# Patient Record
Sex: Female | Born: 1935 | Race: White | Hispanic: No | Marital: Single | Smoking: Former smoker
Health system: Southern US, Community
[De-identification: ages and names within clinical notes are randomized; demographics above are authoritative.]

## PROBLEM LIST (undated history)

## (undated) DIAGNOSIS — M545 Low back pain, unspecified: Secondary | ICD-10-CM

## (undated) DIAGNOSIS — E785 Hyperlipidemia, unspecified: Secondary | ICD-10-CM

## (undated) DIAGNOSIS — E559 Vitamin D deficiency, unspecified: Secondary | ICD-10-CM

## (undated) DIAGNOSIS — M1990 Unspecified osteoarthritis, unspecified site: Secondary | ICD-10-CM

## (undated) DIAGNOSIS — R7303 Prediabetes: Secondary | ICD-10-CM

## (undated) DIAGNOSIS — I10 Essential (primary) hypertension: Secondary | ICD-10-CM

## (undated) HISTORY — PX: EYE SURGERY: SHX253

## (undated) HISTORY — DX: Vitamin D deficiency, unspecified: E55.9

## (undated) HISTORY — PX: JOINT REPLACEMENT: SHX530

## (undated) HISTORY — PX: ABDOMINAL HYSTERECTOMY: SHX81

## (undated) HISTORY — DX: Low back pain: M54.5

## (undated) HISTORY — DX: Low back pain, unspecified: M54.50

## (undated) HISTORY — DX: Hyperlipidemia, unspecified: E78.5

## (undated) HISTORY — PX: BACK SURGERY: SHX140

## (undated) HISTORY — DX: Prediabetes: R73.03

## (undated) HISTORY — PX: SPINE SURGERY: SHX786

## (undated) HISTORY — PX: OTHER SURGICAL HISTORY: SHX169

## (undated) HISTORY — DX: Essential (primary) hypertension: I10

---

## 2006-11-09 HISTORY — PX: OTHER SURGICAL HISTORY: SHX169

## 2006-11-09 LAB — HM COLONOSCOPY

## 2007-07-04 ENCOUNTER — Emergency Department (HOSPITAL_COMMUNITY): Admission: EM | Admit: 2007-07-04 | Discharge: 2007-07-05 | Payer: Self-pay | Admitting: Emergency Medicine

## 2007-10-03 ENCOUNTER — Encounter: Admission: RE | Admit: 2007-10-03 | Discharge: 2007-10-03 | Payer: Self-pay | Admitting: Specialist

## 2009-07-25 ENCOUNTER — Encounter: Admission: RE | Admit: 2009-07-25 | Discharge: 2009-07-25 | Payer: Self-pay | Admitting: Internal Medicine

## 2009-08-01 ENCOUNTER — Encounter: Payer: Self-pay | Admitting: Cardiology

## 2009-11-05 ENCOUNTER — Encounter: Payer: Self-pay | Admitting: Cardiology

## 2010-08-05 ENCOUNTER — Encounter: Payer: Self-pay | Admitting: Cardiology

## 2010-08-08 ENCOUNTER — Encounter: Payer: Self-pay | Admitting: Cardiology

## 2010-08-27 ENCOUNTER — Ambulatory Visit: Payer: Self-pay | Admitting: Cardiology

## 2010-08-27 ENCOUNTER — Encounter: Payer: Self-pay | Admitting: Cardiology

## 2010-09-08 ENCOUNTER — Encounter: Payer: Self-pay | Admitting: Cardiovascular Disease

## 2010-09-08 ENCOUNTER — Telehealth (INDEPENDENT_AMBULATORY_CARE_PROVIDER_SITE_OTHER): Payer: Self-pay | Admitting: Radiology

## 2010-09-09 ENCOUNTER — Encounter: Payer: Self-pay | Admitting: Cardiology

## 2010-09-09 ENCOUNTER — Encounter: Payer: Self-pay | Admitting: Cardiovascular Disease

## 2010-09-09 ENCOUNTER — Ambulatory Visit: Payer: Self-pay | Admitting: Cardiology

## 2010-09-09 ENCOUNTER — Encounter (HOSPITAL_COMMUNITY)
Admission: RE | Admit: 2010-09-09 | Discharge: 2010-11-08 | Payer: Self-pay | Source: Home / Self Care | Attending: Cardiology | Admitting: Cardiology

## 2010-09-09 ENCOUNTER — Ambulatory Visit: Payer: Self-pay | Admitting: Cardiovascular Disease

## 2010-09-09 ENCOUNTER — Ambulatory Visit: Payer: Self-pay

## 2010-09-09 ENCOUNTER — Ambulatory Visit (HOSPITAL_COMMUNITY): Admission: RE | Admit: 2010-09-09 | Discharge: 2010-09-09 | Payer: Self-pay | Admitting: Cardiology

## 2010-09-09 HISTORY — PX: OTHER SURGICAL HISTORY: SHX169

## 2010-09-11 ENCOUNTER — Ambulatory Visit: Payer: Self-pay | Admitting: Cardiology

## 2010-09-24 ENCOUNTER — Encounter: Payer: Self-pay | Admitting: Cardiology

## 2010-09-30 LAB — CONVERTED CEMR LAB
BUN: 31 mg/dL — ABNORMAL HIGH (ref 6–23)
GFR calc non Af Amer: 39.11 mL/min (ref >60)
Glucose, Bld: 115 mg/dL — ABNORMAL HIGH (ref 70–99)
Potassium: 4.7 meq/L (ref 3.5–5.1)

## 2010-10-30 ENCOUNTER — Encounter: Payer: Self-pay | Admitting: Cardiology

## 2010-12-09 NOTE — Assessment & Plan Note (Signed)
Summary: Cardiology Nuclear Testing  Nuclear Med Background Indications for Stress Test: Evaluation for Ischemia, Abnormal EKG    History Comments: No documented CAD  Symptoms: DOE, Fatigue    Nuclear Pre-Procedure Cardiac Risk Factors: History of Smoking, Hypertension, Lipids, NIDDM Caffeine/Decaff Intake: none NPO After: 7:00 PM Lungs: Clear IV 0.9% NS with Angio Cath: 22g     IV Site: R Antecubital IV Started by: Matilde Haymaker, RN Chest Size (in) 40     Cup Size B     Height (in): 59 Weight (lb): 127 BMI: 25.74  Nuclear Med Study 1 or 2 day study:  1 day     Stress Test Type:  Carlton Adam Reading MD:  Mertie Moores, MD     Referring MD:  Loralie Champagne, MD Resting Radionuclide:  Technetium 65m Tetrofosmin     Resting Radionuclide Dose:  10.3 mCi  Stress Radionuclide:  Technetium 59m Tetrofosmin     Stress Radionuclide Dose:  33 mCi   Stress Protocol   Lexiscan: 0.4 mg   Stress Test Technologist:  Valetta Fuller, CMA-N     Nuclear Technologist:  Annye Rusk, CNMT  Rest Procedure  Myocardial perfusion imaging was performed at rest 45 minutes following the intravenous administration of Technetium 104m Tetrofosmin.  Stress Procedure  The patient received IV Lexiscan 0.4 mg over 15-seconds.  Technetium 31m Tetrofosmin injected at 30-seconds.  There were very minor nonspecific ST changes with infusion.  Quantitative spect images were obtained after a 45 minute delay.  QPS Raw Data Images:  Normal; no motion artifact; normal heart/lung ratio. Stress Images:  Normal homogeneous uptake in all areas of the myocardium. Rest Images:  Normal homogeneous uptake in all areas of the myocardium. Subtraction (SDS):  No evidence of ischemia. Transient Ischemic Dilatation:  0.93  (Normal <1.22)  Lung/Heart Ratio:  0.34  (Normal <0.45)  Quantitative Gated Spect Images QGS EDV:  37 ml QGS ESV:  7 ml QGS EF:  81 % QGS cine images:  Normal LV function.  Findings Normal nuclear  study      Overall Impression  BP Response: Normal blood pressure response. Clinical Symptoms: No chest pain ECG Impression: No significant ST segment change suggestive of ischemia. Overall Impression: Normal stress nuclear study. Overall Impression Comments: No evidence of ischemia.  Normal LV function.  Appended Document: Cardiology Nuclear Testing appt 09/11/10 with Dr Aundra Dubin

## 2010-12-09 NOTE — Progress Notes (Signed)
Summary: nuc pre-proedure  Phone Note Outgoing Call   Call placed by: Charlton Amor, CNMT,  September 08, 2010 1:02 PM Call placed to: Patient Reason for Call: Confirm/change Appt Summary of Call: Reviewed information on Myoview Information Sheet (see scanned document for further details).  Spoke with patient.  Initial call taken by: Charlton Amor, CNMT,  September 08, 2010 1:02 PM     Nuclear Med Background Indications for Stress Test: Evaluation for Ischemia, Abnormal EKG     Symptoms: DOE    Nuclear Pre-Procedure Cardiac Risk Factors: History of Smoking, Hypertension, Lipids, NIDDM Height (in): 68

## 2010-12-09 NOTE — Assessment & Plan Note (Signed)
Summary: f/u echo/myoview done 09-09-10/sl   Primary Provider:  Dr Rachel Moulds   History of Present Illness: 77 yo with history of HTN, hyperlipidemia, and diet-controlled DM presented recently for cardiology evaluation.  Patient was seen at Dr. Franklyn Lor office and noted to have an abnormal ECG so was referred for cardiology evaluation.  Patient has not history of heart problems.  In the past, she has had some heartburn-type chest pain after meals that resolves with Tums.  This has not occurred recently.  She has been short of breath when climbing a flight of steps for about a year.  She is short of breath if she walks too fast.  No orthopnea or PND.  Lexiscan myoview showed EF 81%, normal perfusion images.  Echo showed normal LV systolic function with mild LV hypertrophy.  Labs (9/11): K 5.1, creatinine 1.22, LDL 61, HDL 72, TSH normal Labs (10/11): K 4.7, creatinine 1.4  Current Medications (verified): 1)  Enalapril Maleate 10 Mg Tabs (Enalapril Maleate) .... Take 1 Tablet Twice Daily 2)  Lipitor 40 Mg Tabs (Atorvastatin Calcium) .... Take One Tablet By Mouth Daily. 3)  Niaspan 500 Mg Cr-Tabs (Niacin (Antihyperlipidemic)) .... At Bedtime 4)  Aspirin 81 Mg Tbec (Aspirin) .... Take One Tablet By Mouth Daily 5)  Calcium Carbonate-Vitamin D 600-400 Mg-Unit  Tabs (Calcium Carbonate-Vitamin D) .... Two Times A Day 6)  Fish Oil   Oil (Fish Oil) .... Two Times A Day 7)  Citrucel 500 Mg Tabs (Methylcellulose (Laxative)) .... Two Times A Day 8)  Magnesium 250 Mg Tabs (Magnesium) .... Once Daily 9)  Aleve 220 Mg Tabs (Naproxen Sodium) .... As Needed  Allergies: No Known Drug Allergies  Past History:  Past Medical History: 1. HTN: Echo (11/11) with EF 55-60%, mild LV hypertrophy, PA systolic pressure 34 mmHg.  2. DM II: diet-controlled 3. Hyperlipidemia 4. Peripheral Neuropathy 5. Low back pain 6. Lexiscan myoview (11/11): EF 81%, normal perfusion images suggesting no ischemia or  infarction.   Family History: Reviewed history from 08/27/2010 and no changes required. Mother-CHF- deceased age 6 Sibling-DM No premature CAD  Social History: Reviewed history from 08/27/2010 and no changes required. 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  Alcohol Use - yes -- occasional Regular Exercise - no Drug Use - no  Vital Signs:  Patient profile:   77 year old female Height:      59 inches Weight:      130.25 pounds Pulse rate:   70 / minute Resp:     14 per minute BP sitting:   138 / 62  (right arm)  Vitals Entered By: Mikle Bosworth RN (September 11, 2010 12:24 PM) Is Patient Diabetic? Yes Comments no complaints.   Physical Exam  General:  Elderly woman in no distress Neck:  Neck supple, no JVD. No masses, thyromegaly or abnormal cervical nodes. Lungs:  Clear bilaterally to auscultation and percussion. Heart:  Non-displaced PMI, chest non-tender; regular rate and rhythm, S1, S2 without murmurs, rubs or gallops. Carotid upstroke normal, no bruit.  Pedals normal pulses. No edema, no varicosities. Abdomen:  Bowel sounds positive; abdomen soft and non-tender without masses, organomegaly, or hernias noted. No hepatosplenomegaly. Extremities:  No clubbing or cyanosis. Neurologic:  Alert and oriented x 3. Psych:  Normal affect.   Impression & Recommendations:  Problem # 1:  SHORTNESS OF BREATH (ICD-786.05) I suspect that this may be due to a degree of deconditioning.  Echo and  myoview were unremarkable.   Problem # 2:  HYPERTENSION, UNSPECIFIED (ICD-401.9) BP looks good after increasing enalapril.  Check BMET.   Patient Instructions: 1)  Your physician recommends that you schedule a follow-up appointment as needed 2)  Your physician recommends that you  lab work at your primary care MD office 3)  Your physician recommends that you continue on your current medications as directed. Please refer to the Current  Medication list given to you today.

## 2010-12-09 NOTE — Assessment & Plan Note (Signed)
Summary: np6/abn ekg/family hx of chf   Visit Type:  Initial Consult Primary Provider:  Dr Rachel Moulds  CC:  no complaints.  History of Present Illness: 77 yo with history of HTN, hyperlipidemia, and diet-controlled DM presents for cardiology evaluation.  Patient was recently seen at Dr. Franklyn Lor office and noted to have an abnormal ECG so was referred for cardiology evaluation.  Patient has not history of heart problems.  In the past, she has had some heartburn-type chest pain after meals that resolves with Tums.  This has not occurred recently.  She has been short of breath when climbing a flight of steps for about a year.  She is short of breath if she walks too fast.  No orthopnea or PND.    ECG: NSR, possible old anteroseptal MI  Labs (9/11): K 5.1, creatinine 1.22, LDL 61, HDL 72, TSH normal  Preventive Screening-Counseling & Management  Alcohol-Tobacco     Smoking Status: quit  Caffeine-Diet-Exercise     Does Patient Exercise: no      Drug Use:  no.    Current Medications (verified): 1)  Enalapril Maleate 5 Mg Tabs (Enalapril Maleate) .... Take One Tablet By Mouth Three Times A Day 2)  Lipitor 40 Mg Tabs (Atorvastatin Calcium) .... Take One Tablet By Mouth Daily. 3)  Niaspan 500 Mg Cr-Tabs (Niacin (Antihyperlipidemic)) .... At Bedtime 4)  Aspirin 81 Mg Tbec (Aspirin) .... Take One Tablet By Mouth Daily 5)  Calcium Carbonate-Vitamin D 600-400 Mg-Unit  Tabs (Calcium Carbonate-Vitamin D) .... Two Times A Day 6)  Fish Oil   Oil (Fish Oil) .... Two Times A Day 7)  Citrucel 500 Mg Tabs (Methylcellulose (Laxative)) .... Two Times A Day 8)  Magnesium 250 Mg Tabs (Magnesium) .... Once Daily 9)  Aleve 220 Mg Tabs (Naproxen Sodium) .... As Needed  Allergies (verified): No Known Drug Allergies  Past History:  Past Medical History: 1. HTN 2. DM II: diet-controlled 3. Hyperlipidemia 4. Peripheral Neuropathy 5. 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  Alcohol Use - yes -- occasional Regular Exercise - no Drug Use - no Smoking Status:  quit Does Patient Exercise:  no Drug Use:  no  Review of Systems       All systems reviewed and negative except as per HPI.   Vital Signs:  Patient profile:   77 year old female Height:      59 inches Weight:      132 pounds BMI:     26.76 Pulse rate:   73 / minute BP sitting:   160 / 70  (left arm) Cuff size:   regular  Vitals Entered By: Mignon Pine, RMA (August 27, 2010 12:00 PM)  Physical Exam  General:  Elderly woman in no distress Head:  normocephalic and atraumatic Nose:  no deformity, discharge, inflammation, or lesions Mouth:  Teeth, gums and palate normal. Oral mucosa normal. Neck:  Neck supple, no JVD. No masses, thyromegaly or abnormal cervical nodes. Lungs:  Clear bilaterally to auscultation and percussion. Heart:  Non-displaced PMI, chest non-tender; regular rate and rhythm, S1, S2 without murmurs, rubs or gallops. Carotid upstroke normal, no bruit.  Pedals normal pulses. No edema, no varicosities. Abdomen:  Bowel sounds positive; abdomen soft and non-tender without masses, organomegaly, or hernias noted. No hepatosplenomegaly. Extremities:  No clubbing or cyanosis. Neurologic:  Alert  and oriented x 3. Skin:  Intact without lesions or rashes. Psych:  Normal affect.   Impression & Recommendations:  Problem # 1:  SHORTNESS OF BREATH (ICD-786.05) Patient has had increased exertional dyspnea for about a year.  No significant chest pain.  Cannot rule out old anteroseptal MI from ECG.  Coronary disease risk factors include HTN, hyperlipidemia, and diabetes.  I will arrange for her to have an ETT-myoview for risk stratification.  I will also get an echo to assess LV function, valves, and RV.    Problem # 2:  HYPERTENSION, UNSPECIFIED  (ICD-401.9) BP is elevated to 160/70.  I will increase enalapril to 10 mg two times a day with BMET in 2wks.   Other Orders: Echocardiogram (Echo) Nuclear Stress Test (Nuc Stress Test)  Patient Instructions: 1)  Your physician recommends that you schedule a follow-up appointment in: 2 weks 2)  Your physician recommends that you return for lab work in: BMET in 2weeks before appoint 3)  Your physician has recommended you make the following change in your medication: please increase your enalapril to 10mg  twice a day 4)  Your physician has requested that you have an echocardiogram.  Echocardiography is a painless test that uses sound waves to create images of your heart. It provides your doctor with information about the size and shape of your heart and how well your heart's chambers and valves are working.  This procedure takes approximately one hour. There are no restrictions for this procedure. 5)  Your physician has requested that you have an exercise stress myoview.  For further information please visit [email or web address].  Please follow instruction sheet, as given. Prescriptions: ENALAPRIL MALEATE 10 MG TABS (ENALAPRIL MALEATE) take 1 tablet twice daily  #60 x 3   Entered by:   Leodis Sias, RN   Authorized by:   Loralie Champagne, MD   Signed by:   Leodis Sias, RN on 08/27/2010   Method used:   Electronically to        [facility]  [phone]* (retail)       [place], [place]  [address]       Ph: [phone] or [phone]       Fax: [phone]   RxID:   [id number]

## 2010-12-09 NOTE — Progress Notes (Signed)
Summary: [facility] Visit Note   [facility] Visit Note   Imported By: Sallee Provencal 09/10/2010 16:09:05  _____________________________________________________________________  External Attachment:    Type:   Image     Comment:   External Document

## 2011-05-05 ENCOUNTER — Encounter: Payer: Self-pay | Admitting: Cardiology

## 2011-08-21 LAB — DIFFERENTIAL
Eosinophils Relative: 0
Monocytes Relative: 5

## 2011-08-21 LAB — CBC
HCT: 32.7 — ABNORMAL LOW
Hemoglobin: 11.4 — ABNORMAL LOW
MCV: 91.9
Platelets: 259
RBC: 3.55 — ABNORMAL LOW
RDW: 12.6
WBC: 17.2 — ABNORMAL HIGH

## 2011-08-21 LAB — COMPREHENSIVE METABOLIC PANEL
Alkaline Phosphatase: 65
CO2: 24
Calcium: 8.8
Glucose, Bld: 185 — ABNORMAL HIGH
Sodium: 134 — ABNORMAL LOW
Total Protein: 6.6

## 2011-08-21 LAB — URINALYSIS, ROUTINE W REFLEX MICROSCOPIC
Bilirubin Urine: NEGATIVE
Urobilinogen, UA: 0.2
pH: 7.5

## 2012-10-31 ENCOUNTER — Other Ambulatory Visit: Payer: Self-pay | Admitting: Orthopedic Surgery

## 2012-10-31 MED ORDER — BUPIVACAINE LIPOSOME 1.3 % IJ SUSP: 20.0000 mL | Freq: Once | INTRAMUSCULAR | Status: DC

## 2012-10-31 MED ORDER — DEXAMETHASONE SODIUM PHOSPHATE 10 MG/ML IJ SOLN: 10.0000 mg | Freq: Once | INTRAMUSCULAR | Status: DC

## 2012-10-31 NOTE — Progress Notes (Signed)
Preoperative surgical orders have been place into the Epic hospital system for Jeanette Espinoza on 10/31/2012, 1:09 PM  by Mickel Crow for surgery on 12/02/2012.  Preop Total Hip - Anterior Approach orders including Experel Injecion, IV Tylenol, and IV Decadron as long as there are no contraindications to the above medications. Arlee Muslim, PA-C

## 2012-11-21 ENCOUNTER — Encounter (HOSPITAL_COMMUNITY): Payer: Self-pay | Admitting: Pharmacy Technician

## 2012-11-23 NOTE — Patient Instructions (Signed)
Jeanette Espinoza  11/23/2012   Your procedure is scheduled on:  12/02/12   Report to [facility] at [place] AM.  Call this number if you have problems the morning of surgery: [phone]   Remember:   Do not eat food or drink liquids after midnight.   Take these medicines the morning of surgery with A SIP OF WATER:    Do not wear jewelry, make-up or nail polish.  Do not wear lotions, powders, or perfumes.    Do not shave 48 hours prior to surgery.    Do not bring valuables to the hospital.  Contacts, dentures or bridgework may not be worn into surgery.  Leave suitcase in the car. After surgery it may be brought to your room.  For patients admitted to the hospital, checkout time is 11:00 AM the day of  discharge.     SEE CHG INSTRUCTION SHEET    Please read over the following fact sheets that you were given: MRSA Information, Blood Transfusion FAct Sheet, coughing and deep breathing exercises, leg exercises, Incentive Spirometry Fact Sheet.                Failure to comply with these instructions may result in cancellation of your surgery.               Patient Signature __________________________             Nurse Signature ___________________________

## 2012-11-24 ENCOUNTER — Encounter (HOSPITAL_COMMUNITY)
Admission: RE | Admit: 2012-11-24 | Discharge: 2012-11-24 | Disposition: A | Discharge status: Home / Self Care | Payer: MEDICARE | Source: Ambulatory Visit | Attending: Orthopedic Surgery | Admitting: Orthopedic Surgery

## 2012-11-24 ENCOUNTER — Encounter (HOSPITAL_COMMUNITY): Payer: Self-pay

## 2012-11-24 ENCOUNTER — Ambulatory Visit (HOSPITAL_COMMUNITY)
Admission: RE | Admit: 2012-11-24 | Discharge: 2012-11-24 | Disposition: A | Discharge status: Home / Self Care | Payer: MEDICARE | Source: Ambulatory Visit | Attending: Orthopedic Surgery | Admitting: Orthopedic Surgery

## 2012-11-24 DIAGNOSIS — I10 Essential (primary) hypertension: Secondary | ICD-10-CM | POA: Insufficient documentation

## 2012-11-24 DIAGNOSIS — M169 Osteoarthritis of hip, unspecified: Secondary | ICD-10-CM | POA: Insufficient documentation

## 2012-11-24 DIAGNOSIS — Z01812 Encounter for preprocedural laboratory examination: Secondary | ICD-10-CM | POA: Insufficient documentation

## 2012-11-24 DIAGNOSIS — M47814 Spondylosis without myelopathy or radiculopathy, thoracic region: Secondary | ICD-10-CM | POA: Insufficient documentation

## 2012-11-24 DIAGNOSIS — M1610 Unilateral primary osteoarthritis, unspecified hip: Secondary | ICD-10-CM | POA: Insufficient documentation

## 2012-11-24 HISTORY — DX: Unspecified osteoarthritis, unspecified site: M19.90

## 2012-11-24 LAB — URINALYSIS, ROUTINE W REFLEX MICROSCOPIC
Bilirubin Urine: NEGATIVE
Glucose, UA: NEGATIVE mg/dL
Hgb urine dipstick: NEGATIVE
Specific Gravity, Urine: 1.011 (ref 1.005–1.030)
pH: 7.5 (ref 5.0–8.0)

## 2012-11-24 LAB — URINE MICROSCOPIC-ADD ON

## 2012-11-24 LAB — COMPREHENSIVE METABOLIC PANEL
ALT: 12 U/L (ref 0–35)
AST: 15 U/L (ref 0–37)
Albumin: 3.8 g/dL (ref 3.5–5.2)
Alkaline Phosphatase: 65 U/L (ref 39–117)
BUN: 26 mg/dL — ABNORMAL HIGH (ref 6–23)
Chloride: 95 mEq/L — ABNORMAL LOW (ref 96–112)
Potassium: 4.8 mEq/L (ref 3.5–5.1)
Sodium: 130 mEq/L — ABNORMAL LOW (ref 135–145)
Total Bilirubin: 0.3 mg/dL (ref 0.3–1.2)
Total Protein: 7.5 g/dL (ref 6.0–8.3)

## 2012-11-24 LAB — CBC
HCT: 34.6 % — ABNORMAL LOW (ref 36.0–46.0)
Platelets: 245 10*3/uL (ref 150–400)
RDW: 11.7 % (ref 11.5–15.5)
WBC: 7 10*3/uL (ref 4.0–10.5)

## 2012-11-24 LAB — SURGICAL PCR SCREEN
MRSA, PCR: NEGATIVE
Staphylococcus aureus: NEGATIVE

## 2012-11-24 LAB — APTT: aPTT: 30 seconds (ref 24–37)

## 2012-11-24 NOTE — Progress Notes (Signed)
Urinalysis with micro results sent to Dr Wynelle Link by [facility] fax.

## 2012-11-24 NOTE — Progress Notes (Signed)
CMP results sent via EPIC fax to Dr Wynelle Link.

## 2012-12-01 ENCOUNTER — Other Ambulatory Visit: Payer: Self-pay | Admitting: Orthopedic Surgery

## 2012-12-01 NOTE — H&P (Signed)
Jeanette Espinoza  DOB: January 10, 1925 Undefined / Language: Cleophus Molt / Race: White Female  Date of Admission:  12/02/2012  Chief Complaint:  Right Hip Pain  History of Present Illness The patient is a 79 year old female who comes in for a preoperative History and Physical. The patient is scheduled for a right total hip arthroplasty to be performed by Dr. Dione Plover. Aluisio, MD at [facility] on 12/02/2012. The patient is a 79 year old female who presents for follow up of their hip. The patient is being followed for their right hip pain.  Symptoms reported today include: pain. The patient feels that they are doing poorly (Patient states that she didn't get any relief from the injection. She said that she has pain from the knee radiating up her leg when she lifts her right leg.). The hip is definitely holding her back from doing things she desires. She has pain at all times. She would like to be more active but cannot do so because of the hip pain. It's occurring in the groin, radiating into her knee. She's not having any swelling in her knee. She had a knee replaced about 15 years ago but it had not given her any trouble until recently. She had the intra-articular hip injection which did not provide benefit. The hip is definitely having a very negative effect on her lifestyle.  She is ready to get the hip fixed. They have been treated conservatively in the past for the above stated problem and despite conservative measures, they continue to have progressive pain and severe functional limitations and dysfunction. They have failed non-operative management including home exercise, medications, and injections. It is felt that they would benefit from undergoing total joint replacement. Risks and benefits of the procedure have been discussed with the patient and they elect to proceed with surgery. There are no active contraindications to surgery such as ongoing infection or rapidly  progressive neurological disease.   Problem List S/P Right total knee arthroplasty (V43.65) Osteoarthritis, Hip (715.35)  Allergies No Known Drug Allergies   Family History Diabetes Mellitus. father and sister Hypertension. mother   Social History Living situation. live alone live in a facility Exercise. Exercises weekly; does running / walking Exercises monthly; does other Number of flights of stairs before winded. 1 less than 1 Marital status. widowed Alcohol use. current drinker; drinks wine; only occasionally per week current drinker; drinks wine and hard liquor Copy of Drug/Alcohol Rehab (Previously). 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 former smoker Tobacco / smoke exposure. no Children. 3 Illicit drug use. no Drug/Alcohol Rehab (Currently). no Post-Surgical Plans. Patient lives at [facility] and wants to look into the skilled area there. Advance Directives. Living Will, Healthcare POA   Medication History Enalapril Maleate (5MG  Tablet, Oral) Active. Fish Oil Burp-Less (1000MG  Capsule, Oral) Active. Magnesium Gluconate (250MG  Tablet, Oral) Active. Aleve (220MG  Tablet, Oral) Active. Vitamin D ( Oral) Specific dose unknown - Active. Aspirin EC (81MG  Tablet DR, Oral) Active.   Past Surgical History Total Knee Replacement. Date: 34. right Cataract Surgery. right Appendectomy Tonsillectomy Spinal Surgery. Date: 49. Colon Polyp Removal - Colonoscopy Carpal Tunnel Repair. left Total Hip Replacement. Date: 17. left Hysterectomy. partial (non-cancerous)  Medical History High blood pressure Hypercholesterolemia Osteoarthritis Impaired Vision Diet-Controlled Diabetes Mellitus   Review of Systems General:Not Present- Chills, Fever, Night Sweats, Fatigue, Weight Gain, Weight Loss and Memory Loss. Skin:Not Present- Hives, Itching, Rash, Eczema and Lesions. HEENT:Not  Present- Tinnitus, Headache,  Double Vision, Visual Loss, Hearing Loss and Dentures. Respiratory:Not Present- Shortness of breath with exertion, Shortness of breath at rest, Allergies, Coughing up blood and Chronic Cough. Cardiovascular:Not Present- Chest Pain, Racing/skipping heartbeats, Difficulty Breathing Lying Down, Murmur, Swelling and Palpitations. Gastrointestinal:Not Present- Bloody Stool, Heartburn, Abdominal Pain, Vomiting, Nausea, Constipation, Diarrhea, Difficulty Swallowing, Jaundice and Loss of appetitie. Female Genitourinary:Not Present- Blood in Urine, Urinary frequency, Weak urinary stream, Discharge, Flank Pain, Incontinence, Painful Urination, Urgency, Urinary Retention and Urinating at Night. Musculoskeletal:Present- Muscle Pain, Joint Pain, Back Pain and Morning Stiffness. Not Present- Muscle Weakness, Joint Swelling and Spasms. Neurological:Not Present- Tremor, Dizziness, Blackout spells, Paralysis, Difficulty with balance and Weakness. Psychiatric:Not Present- Insomnia.   Vitals Weight: 135 lb Height: 59 in Weight was reported by patient. Height was reported by patient. Body Surface Area: 1.6 m Body Mass Index: 27.27 kg/m Pulse: 60 (Regular) Resp.: 14 (Unlabored) BP: 132/68 (Sitting, Left Arm, Standard)    Physical Exam The physical exam findings are as follows:  Note: Pateint is a 79 year old female with continuned right hip pain.   General Mental Status - Alert, cooperative and good historian. General Appearance- pleasant. Not in acute distress. Orientation- Oriented X3. Build & Nutrition- Well nourished and Well developed.   Head and Neck Head- normocephalic, atraumatic . Neck Global Assessment- supple. no bruit auscultated on the right and no bruit auscultated on the left.   Eye Vision- Wears corrective lenses. Pupil- Bilateral- Regular and Round. Motion- Bilateral- EOMI.   Chest and Lung  Exam Auscultation: Breath sounds:- clear at anterior chest wall and - clear at posterior chest wall. Adventitious sounds:- No Adventitious sounds.   Cardiovascular Auscultation:Rhythm- Regular rate and rhythm. Heart Sounds- S1 WNL and S2 WNL. Murmurs & Other Heart Sounds: Murmur 1:Location- Aortic Area. Timing- Holosystolic. Grade- II/VI. Character- Low pitched.   Abdomen Palpation/Percussion:Tenderness- Abdomen is non-tender to palpation. Rigidity (guarding)- Abdomen is soft. Auscultation:Auscultation of the abdomen reveals - Bowel sounds normal.   Female Genitourinary  Not done, not pertinent to present illness  Musculoskeletal On exam, well-developed female, alert and oriented, in no apparent distress. She appears much younger than her stated age. Evaluation of her left hip is flexion 110, rotation in 10, out 30, abduction 30 without discomfort. Right hip flexion 90, no internal or external rotation, only about 10 degrees of abduction. Her pulses, sensation and motor are intact. Her right knee range is about 5-120, no tenderness or instability.  RADIOGRAPHS: Radiographs from last visit, AP pelvis and lateral of the right hip and she's got some bone on bone change with slight protrusio, right hip. She also has large osteophytes. Her left hip replacement remains in good position. No abnormalities. Right knee replacement is in good position, no abnormalities.  Assessment & Plan Osteoarthritis, Hip (715.35) Impression: Right Hip  Note: Plan is for a Right Total Hip Replacement by Dr. Wynelle Link.  Plan is to go to Rehab. Patient lives in the [place].  PCP - Dr. Unk Pinto - Patient has been seen preoperatively and felt to be stable for surgery.  Signed electronically by Jeneen Montgomery, PA-C

## 2012-12-02 ENCOUNTER — Inpatient Hospital Stay (HOSPITAL_COMMUNITY): Payer: MEDICARE | Admitting: Anesthesiology

## 2012-12-02 ENCOUNTER — Encounter (HOSPITAL_COMMUNITY): Payer: Self-pay | Admitting: *Deleted

## 2012-12-02 ENCOUNTER — Inpatient Hospital Stay (HOSPITAL_COMMUNITY): Payer: MEDICARE

## 2012-12-02 ENCOUNTER — Inpatient Hospital Stay (HOSPITAL_COMMUNITY)
Admission: RE | Admit: 2012-12-02 | Discharge: 2012-12-05 | DRG: 470 | Disposition: A | Discharge status: Skilled Nursing Facility | Payer: MEDICARE | Source: Ambulatory Visit | Attending: Orthopedic Surgery | Admitting: Orthopedic Surgery

## 2012-12-02 ENCOUNTER — Encounter (HOSPITAL_COMMUNITY): Payer: Self-pay | Admitting: Anesthesiology

## 2012-12-02 ENCOUNTER — Encounter (HOSPITAL_COMMUNITY)
Admission: RE | Disposition: A | Discharge status: Skilled Nursing Facility | Payer: Self-pay | Source: Ambulatory Visit | Attending: Orthopedic Surgery

## 2012-12-02 DIAGNOSIS — Z90710 Acquired absence of both cervix and uterus: Secondary | ICD-10-CM

## 2012-12-02 DIAGNOSIS — M1610 Unilateral primary osteoarthritis, unspecified hip: Principal | ICD-10-CM | POA: Diagnosis present

## 2012-12-02 DIAGNOSIS — Z860100 Personal history of colon polyps, unspecified: Secondary | ICD-10-CM

## 2012-12-02 DIAGNOSIS — E871 Hypo-osmolality and hyponatremia: Secondary | ICD-10-CM | POA: Diagnosis not present

## 2012-12-02 DIAGNOSIS — Z96649 Presence of unspecified artificial hip joint: Secondary | ICD-10-CM

## 2012-12-02 DIAGNOSIS — Z9289 Personal history of other medical treatment: Secondary | ICD-10-CM

## 2012-12-02 DIAGNOSIS — D62 Acute posthemorrhagic anemia: Secondary | ICD-10-CM | POA: Diagnosis not present

## 2012-12-02 DIAGNOSIS — Z9089 Acquired absence of other organs: Secondary | ICD-10-CM

## 2012-12-02 DIAGNOSIS — Z87891 Personal history of nicotine dependence: Secondary | ICD-10-CM

## 2012-12-02 DIAGNOSIS — M545 Low back pain, unspecified: Secondary | ICD-10-CM | POA: Diagnosis present

## 2012-12-02 DIAGNOSIS — Z79899 Other long term (current) drug therapy: Secondary | ICD-10-CM

## 2012-12-02 DIAGNOSIS — E785 Hyperlipidemia, unspecified: Secondary | ICD-10-CM | POA: Diagnosis present

## 2012-12-02 DIAGNOSIS — E119 Type 2 diabetes mellitus without complications: Secondary | ICD-10-CM | POA: Diagnosis present

## 2012-12-02 DIAGNOSIS — Z7982 Long term (current) use of aspirin: Secondary | ICD-10-CM

## 2012-12-02 DIAGNOSIS — R9431 Abnormal electrocardiogram [ECG] [EKG]: Secondary | ICD-10-CM

## 2012-12-02 DIAGNOSIS — R0602 Shortness of breath: Secondary | ICD-10-CM

## 2012-12-02 DIAGNOSIS — M169 Osteoarthritis of hip, unspecified: Secondary | ICD-10-CM | POA: Diagnosis present

## 2012-12-02 DIAGNOSIS — I10 Essential (primary) hypertension: Secondary | ICD-10-CM | POA: Diagnosis present

## 2012-12-02 DIAGNOSIS — Z96659 Presence of unspecified artificial knee joint: Secondary | ICD-10-CM

## 2012-12-02 HISTORY — PX: TOTAL HIP ARTHROPLASTY: SHX124

## 2012-12-02 LAB — GLUCOSE, CAPILLARY: Glucose-Capillary: 98 mg/dL (ref 70–99)

## 2012-12-02 LAB — ABO/RH: ABO/RH(D): O POS

## 2012-12-02 SURGERY — ARTHROPLASTY, HIP, TOTAL,POSTERIOR APPROACH
Anesthesia: Spinal | Site: Hip | Laterality: Right | Wound class: Clean

## 2012-12-02 MED ORDER — MIDAZOLAM HCL 5 MG/5ML IJ SOLN
INTRAMUSCULAR | Status: DC | PRN
  Administered 2012-12-02: 2 mg via INTRAVENOUS

## 2012-12-02 MED ORDER — PHENYLEPHRINE HCL 10 MG/ML IJ SOLN
10.0000 mg | INTRAVENOUS | Status: DC | PRN
  Administered 2012-12-02: 10 ug/min via INTRAVENOUS

## 2012-12-02 MED ORDER — CEFAZOLIN SODIUM 1-5 GM-% IV SOLN
1.0000 g | Freq: Four times a day (QID) | INTRAVENOUS | Status: AC
  Administered 2012-12-02 (×2): 1 g via INTRAVENOUS
  Filled 2012-12-02 (×2): qty 50

## 2012-12-02 MED ORDER — 0.9 % SODIUM CHLORIDE (POUR BTL) OPTIME
TOPICAL | Status: DC | PRN
  Administered 2012-12-02: 1000 mL

## 2012-12-02 MED ORDER — METHOCARBAMOL 100 MG/ML IJ SOLN
500.0000 mg | Freq: Four times a day (QID) | INTRAVENOUS | Status: DC | PRN
  Administered 2012-12-02 – 2012-12-03 (×3): 500 mg via INTRAVENOUS
  Filled 2012-12-02 (×5): qty 5

## 2012-12-02 MED ORDER — DOCUSATE SODIUM 100 MG PO CAPS
100.0000 mg | ORAL_CAPSULE | Freq: Two times a day (BID) | ORAL | Status: DC
  Administered 2012-12-03 – 2012-12-05 (×5): 100 mg via ORAL

## 2012-12-02 MED ORDER — OXYCODONE HCL 5 MG PO TABS
5.0000 mg | ORAL_TABLET | ORAL | Status: DC | PRN
Start: 2012-12-02 — End: 2012-12-03
  Administered 2012-12-02: 10 mg via ORAL
  Administered 2012-12-02 (×2): 5 mg via ORAL
  Filled 2012-12-02: qty 1
  Filled 2012-12-02: qty 2
  Filled 2012-12-02: qty 1

## 2012-12-02 MED ORDER — ENALAPRIL MALEATE 20 MG PO TABS
20.0000 mg | ORAL_TABLET | Freq: Every day | ORAL | Status: DC
  Administered 2012-12-03 – 2012-12-04 (×2): 20 mg via ORAL
  Filled 2012-12-02 (×4): qty 1

## 2012-12-02 MED ORDER — LACTATED RINGERS IV SOLN: INTRAVENOUS | Status: DC

## 2012-12-02 MED ORDER — MENTHOL 3 MG MT LOZG
1.0000 | LOZENGE | OROMUCOSAL | Status: DC | PRN
  Filled 2012-12-02: qty 9

## 2012-12-02 MED ORDER — PROPOFOL INFUSION 10 MG/ML OPTIME
INTRAVENOUS | Status: DC | PRN
  Administered 2012-12-02: 25 ug/kg/min via INTRAVENOUS

## 2012-12-02 MED ORDER — PHENOL 1.4 % MT LIQD
1.0000 | OROMUCOSAL | Status: DC | PRN
  Filled 2012-12-02: qty 177

## 2012-12-02 MED ORDER — METHOCARBAMOL 500 MG PO TABS
500.0000 mg | ORAL_TABLET | Freq: Four times a day (QID) | ORAL | Status: DC | PRN
  Administered 2012-12-04 – 2012-12-05 (×2): 500 mg via ORAL
  Filled 2012-12-02 (×3): qty 1

## 2012-12-02 MED ORDER — POLYETHYLENE GLYCOL 3350 17 G PO PACK
17.0000 g | PACK | Freq: Every day | ORAL | Status: DC | PRN
  Administered 2012-12-03 – 2012-12-04 (×2): 17 g via ORAL

## 2012-12-02 MED ORDER — TRAMADOL HCL 50 MG PO TABS
50.0000 mg | ORAL_TABLET | Freq: Four times a day (QID) | ORAL | Status: DC | PRN
Start: 2012-12-02 — End: 2012-12-05
  Administered 2012-12-03 – 2012-12-05 (×6): 50 mg via ORAL
  Filled 2012-12-02 (×6): qty 1

## 2012-12-02 MED ORDER — METOCLOPRAMIDE HCL 5 MG/ML IJ SOLN
5.0000 mg | Freq: Three times a day (TID) | INTRAMUSCULAR | Status: DC | PRN
  Administered 2012-12-02: 10 mg via INTRAVENOUS
  Filled 2012-12-02: qty 2

## 2012-12-02 MED ORDER — HETASTARCH-ELECTROLYTES 6 % IV SOLN
INTRAVENOUS | Status: DC | PRN
  Administered 2012-12-02: 10:00:00 via INTRAVENOUS

## 2012-12-02 MED ORDER — FENTANYL CITRATE 0.05 MG/ML IJ SOLN
INTRAMUSCULAR | Status: DC | PRN
  Administered 2012-12-02: 50 ug via INTRAVENOUS

## 2012-12-02 MED ORDER — ACETAMINOPHEN 325 MG PO TABS: 650.0000 mg | ORAL_TABLET | Freq: Four times a day (QID) | ORAL | Status: DC | PRN

## 2012-12-02 MED ORDER — BISACODYL 10 MG RE SUPP: 10.0000 mg | Freq: Every day | RECTAL | Status: DC | PRN

## 2012-12-02 MED ORDER — BISOPROLOL FUMARATE 5 MG PO TABS
5.0000 mg | ORAL_TABLET | Freq: Once | ORAL | Status: AC
  Administered 2012-12-02: 5 mg via ORAL
  Filled 2012-12-02: qty 1

## 2012-12-02 MED ORDER — STERILE WATER FOR IRRIGATION IR SOLN
Status: DC | PRN
  Administered 2012-12-02: 3000 mL

## 2012-12-02 MED ORDER — CEFAZOLIN SODIUM-DEXTROSE 2-3 GM-% IV SOLR
2.0000 g | INTRAVENOUS | Status: AC
  Administered 2012-12-02: 2 g via INTRAVENOUS

## 2012-12-02 MED ORDER — SODIUM CHLORIDE 0.9 % IV SOLN
INTRAVENOUS | Status: DC
  Administered 2012-12-02: 20:00:00 via INTRAVENOUS

## 2012-12-02 MED ORDER — BUPIVACAINE LIPOSOME 1.3 % IJ SUSP
20.0000 mL | Freq: Once | INTRAMUSCULAR | Status: DC
  Filled 2012-12-02: qty 20

## 2012-12-02 MED ORDER — ONDANSETRON HCL 4 MG/2ML IJ SOLN: 4.0000 mg | Freq: Four times a day (QID) | INTRAMUSCULAR | Status: DC | PRN

## 2012-12-02 MED ORDER — METOCLOPRAMIDE HCL 10 MG PO TABS: 5.0000 mg | ORAL_TABLET | Freq: Three times a day (TID) | ORAL | Status: DC | PRN

## 2012-12-02 MED ORDER — MORPHINE SULFATE 2 MG/ML IJ SOLN
1.0000 mg | INTRAMUSCULAR | Status: DC | PRN
  Administered 2012-12-02 (×2): 1 mg via INTRAVENOUS
  Administered 2012-12-02 (×2): 2 mg via INTRAVENOUS
  Filled 2012-12-02 (×4): qty 1

## 2012-12-02 MED ORDER — PROMETHAZINE HCL 25 MG/ML IJ SOLN: 6.2500 mg | INTRAMUSCULAR | Status: DC | PRN

## 2012-12-02 MED ORDER — FLEET ENEMA 7-19 GM/118ML RE ENEM: 1.0000 | ENEMA | Freq: Once | RECTAL | Status: AC | PRN

## 2012-12-02 MED ORDER — BISOPROLOL-HYDROCHLOROTHIAZIDE 5-6.25 MG PO TABS
1.0000 | ORAL_TABLET | Freq: Every morning | ORAL | Status: DC
  Administered 2012-12-03 – 2012-12-05 (×3): 1 via ORAL
  Filled 2012-12-02 (×3): qty 1

## 2012-12-02 MED ORDER — HYDROMORPHONE HCL PF 1 MG/ML IJ SOLN: 0.2500 mg | INTRAMUSCULAR | Status: DC | PRN

## 2012-12-02 MED ORDER — DIPHENHYDRAMINE HCL 12.5 MG/5ML PO ELIX: 12.5000 mg | ORAL_SOLUTION | ORAL | Status: DC | PRN

## 2012-12-02 MED ORDER — ACETAMINOPHEN 10 MG/ML IV SOLN
1000.0000 mg | Freq: Four times a day (QID) | INTRAVENOUS | Status: AC
  Administered 2012-12-02 – 2012-12-03 (×4): 1000 mg via INTRAVENOUS
  Filled 2012-12-02 (×6): qty 100

## 2012-12-02 MED ORDER — ACETAMINOPHEN 650 MG RE SUPP: 650.0000 mg | Freq: Four times a day (QID) | RECTAL | Status: DC | PRN

## 2012-12-02 MED ORDER — PROPOFOL 10 MG/ML IV BOLUS
INTRAVENOUS | Status: DC | PRN
  Administered 2012-12-02: 30 mg via INTRAVENOUS

## 2012-12-02 MED ORDER — ENOXAPARIN SODIUM 40 MG/0.4ML ~~LOC~~ SOLN
40.0000 mg | SUBCUTANEOUS | Status: DC
  Administered 2012-12-03 – 2012-12-04 (×2): 40 mg via SUBCUTANEOUS
  Filled 2012-12-02 (×3): qty 0.4

## 2012-12-02 MED ORDER — DEXAMETHASONE SODIUM PHOSPHATE 10 MG/ML IJ SOLN: 10.0000 mg | Freq: Once | INTRAMUSCULAR | Status: AC

## 2012-12-02 MED ORDER — CHLORHEXIDINE GLUCONATE 4 % EX LIQD
60.0000 mL | Freq: Once | CUTANEOUS | Status: DC
  Filled 2012-12-02: qty 60

## 2012-12-02 MED ORDER — LACTATED RINGERS IV SOLN
INTRAVENOUS | Status: DC
  Administered 2012-12-02: 11:00:00 via INTRAVENOUS
  Administered 2012-12-02: 1000 mL via INTRAVENOUS

## 2012-12-02 MED ORDER — ONDANSETRON HCL 4 MG PO TABS: 4.0000 mg | ORAL_TABLET | Freq: Four times a day (QID) | ORAL | Status: DC | PRN

## 2012-12-02 MED ORDER — MEPERIDINE HCL 50 MG/ML IJ SOLN: 6.2500 mg | INTRAMUSCULAR | Status: DC | PRN

## 2012-12-02 MED ORDER — ACETAMINOPHEN 10 MG/ML IV SOLN
1000.0000 mg | Freq: Once | INTRAVENOUS | Status: AC
  Administered 2012-12-02: 1000 mg via INTRAVENOUS

## 2012-12-02 MED ORDER — DEXAMETHASONE 6 MG PO TABS
10.0000 mg | ORAL_TABLET | Freq: Once | ORAL | Status: AC
  Administered 2012-12-03: 10 mg via ORAL
  Filled 2012-12-02: qty 1

## 2012-12-02 MED ORDER — SODIUM CHLORIDE 0.9 % IJ SOLN
INTRAMUSCULAR | Status: DC | PRN
  Administered 2012-12-02: 10:00:00

## 2012-12-02 MED ORDER — SODIUM CHLORIDE 0.9 % IV SOLN: INTRAVENOUS | Status: DC

## 2012-12-02 MED ORDER — SODIUM CHLORIDE 0.9 % IR SOLN
Status: DC | PRN
  Administered 2012-12-02: 1000 mL

## 2012-12-02 SURGICAL SUPPLY — 49 items
BAG ZIPLOCK 12X15 (MISCELLANEOUS) ×2 IMPLANT
BIT DRILL 2.8X128 (BIT) ×2 IMPLANT
BLADE EXTENDED COATED 6.5IN (ELECTRODE) ×2 IMPLANT
BLADE SAW SAG 73X25 THK (BLADE) ×1
BLADE SAW SGTL 73X25 THK (BLADE) ×1 IMPLANT
CLOTH BEACON ORANGE TIMEOUT ST (SAFETY) ×2 IMPLANT
DECANTER SPIKE VIAL GLASS SM (MISCELLANEOUS) ×2 IMPLANT
DRAPE INCISE IOBAN 66X45 STRL (DRAPES) ×2 IMPLANT
DRAPE ORTHO SPLIT 77X108 STRL (DRAPES) ×2
DRAPE POUCH INSTRU U-SHP 10X18 (DRAPES) ×2 IMPLANT
DRAPE SURG ORHT 6 SPLT 77X108 (DRAPES) ×2 IMPLANT
DRAPE U-SHAPE 47X51 STRL (DRAPES) ×2 IMPLANT
DRSG ADAPTIC 3X8 NADH LF (GAUZE/BANDAGES/DRESSINGS) ×2 IMPLANT
DRSG MEPILEX BORDER 4X4 (GAUZE/BANDAGES/DRESSINGS) ×2 IMPLANT
DRSG MEPILEX BORDER 4X8 (GAUZE/BANDAGES/DRESSINGS) ×2 IMPLANT
DURAPREP 26ML APPLICATOR (WOUND CARE) ×2 IMPLANT
ELECT REM PT RETURN 9FT ADLT (ELECTROSURGICAL) ×2
ELECTRODE REM PT RTRN 9FT ADLT (ELECTROSURGICAL) ×1 IMPLANT
EVACUATOR 1/8 PVC DRAIN (DRAIN) ×2 IMPLANT
FACESHIELD LNG OPTICON STERILE (SAFETY) ×8 IMPLANT
GLOVE BIO SURGEON STRL SZ8 (GLOVE) ×2 IMPLANT
GLOVE BIOGEL PI IND STRL 8 (GLOVE) ×2 IMPLANT
GLOVE BIOGEL PI INDICATOR 8 (GLOVE) ×2
GLOVE ECLIPSE 8.0 STRL XLNG CF (GLOVE) ×2 IMPLANT
GLOVE SURG SS PI 6.5 STRL IVOR (GLOVE) ×4 IMPLANT
GOWN STRL NON-REIN LRG LVL3 (GOWN DISPOSABLE) ×4 IMPLANT
GOWN STRL REIN XL XLG (GOWN DISPOSABLE) ×2 IMPLANT
IMMOBILIZER KNEE 20 (SOFTGOODS)
IMMOBILIZER KNEE 20 THIGH 36 (SOFTGOODS) IMPLANT
KIT BASIN OR (CUSTOM PROCEDURE TRAY) ×2 IMPLANT
MANIFOLD NEPTUNE II (INSTRUMENTS) ×2 IMPLANT
NDL SAFETY ECLIPSE 18X1.5 (NEEDLE) ×1 IMPLANT
NEEDLE HYPO 18GX1.5 SHARP (NEEDLE) ×1
NS IRRIG 1000ML POUR BTL (IV SOLUTION) ×2 IMPLANT
PACK TOTAL JOINT (CUSTOM PROCEDURE TRAY) ×2 IMPLANT
PASSER SUT SWANSON 36MM LOOP (INSTRUMENTS) ×2 IMPLANT
POSITIONER SURGICAL ARM (MISCELLANEOUS) ×2 IMPLANT
SPONGE GAUZE 4X4 12PLY (GAUZE/BANDAGES/DRESSINGS) ×2 IMPLANT
STRIP CLOSURE SKIN 1/2X4 (GAUZE/BANDAGES/DRESSINGS) ×4 IMPLANT
SUT ETHIBOND NAB CT1 #1 30IN (SUTURE) ×4 IMPLANT
SUT MNCRL AB 4-0 PS2 18 (SUTURE) ×2 IMPLANT
SUT VIC AB 2-0 CT1 27 (SUTURE) ×3
SUT VIC AB 2-0 CT1 TAPERPNT 27 (SUTURE) ×3 IMPLANT
SUT VLOC 180 0 24IN GS25 (SUTURE) ×4 IMPLANT
SYR 50ML LL SCALE MARK (SYRINGE) ×2 IMPLANT
TOWEL OR 17X26 10 PK STRL BLUE (TOWEL DISPOSABLE) ×4 IMPLANT
TOWEL OR NON WOVEN STRL DISP B (DISPOSABLE) ×2 IMPLANT
TRAY FOLEY CATH 14FRSI W/METER (CATHETERS) ×2 IMPLANT
WATER STERILE IRR 1500ML POUR (IV SOLUTION) ×2 IMPLANT

## 2012-12-02 NOTE — Anesthesia Preprocedure Evaluation (Addendum)
Anesthesia Evaluation  Patient identified by MRN, date of birth, ID band Patient awake    Reviewed: Allergy & Precautions, H&P , NPO status , Patient's Chart, lab work & pertinent test results  Airway Mallampati: II TM Distance: >3 FB Neck ROM: Full    Dental No notable dental hx.    Pulmonary neg pulmonary ROS,  breath sounds clear to auscultation  Pulmonary exam normal       Cardiovascular hypertension, Pt. on medications negative cardio ROS  Rhythm:Regular Rate:Normal     Neuro/Psych negative neurological ROS  negative psych ROS   GI/Hepatic negative GI ROS, Neg liver ROS,   Endo/Other  negative endocrine ROS  Renal/GU negative Renal ROS  negative genitourinary   Musculoskeletal negative musculoskeletal ROS (+)   Abdominal   Peds negative pediatric ROS (+)  Hematology negative hematology ROS (+)   Anesthesia Other Findings   Reproductive/Obstetrics negative OB ROS                          Anesthesia Physical Anesthesia Plan  ASA: II  Anesthesia Plan: Spinal   Post-op Pain Management:    Induction:   Airway Management Planned: Simple Face Mask  Additional Equipment:   Intra-op Plan:   Post-operative Plan:   Informed Consent: I have reviewed the patients History and Physical, chart, labs and discussed the procedure including the risks, benefits and alternatives for the proposed anesthesia with the patient or authorized representative who has indicated his/her understanding and acceptance.   Dental advisory given  Plan Discussed with: CRNA  Anesthesia Plan Comments:         Anesthesia Quick Evaluation

## 2012-12-02 NOTE — Interval H&P Note (Signed)
History and Physical Interval Note:  12/02/2012 8:35 AM  Jeanette Espinoza  has presented today for surgery, with the diagnosis of OA RIGHT HIP  The various methods of treatment have been discussed with the patient and family. After consideration of risks, benefits and other options for treatment, the patient has consented to  Procedure(s) (LRB) with comments: TOTAL HIP ARTHROPLASTY (Right) as a surgical intervention .  The patient's history has been reviewed, patient examined, no change in status, stable for surgery.  I have reviewed the patient's chart and labs.  Questions were answered to the patient's satisfaction.     Gearlean Alf

## 2012-12-02 NOTE — Anesthesia Procedure Notes (Signed)
Spinal  Start time: 12/02/2012 9:17 AM End time: 12/02/2012 9:25 AM Staffing Anesthesiologist: Montez Hageman CRNA/Resident: Enrigue Catena E Preanesthetic Checklist Completed: patient identified, site marked, surgical consent, pre-op evaluation, IV checked, risks and benefits discussed and monitors and equipment checked Spinal Block Patient position: sitting Prep: Betadine Patient monitoring: continuous pulse ox and blood pressure Approach: right paramedian Location: L3-4 Injection technique: single-shot Needle Needle gauge: 22 G Additional Notes CSF x 3 negative heme , negative paresthesia. Carignan placed needle after CRNA attempt x 2, CRNA injected  Bupivacaine .5%   2.5cc's, Pt tolerated well

## 2012-12-02 NOTE — Op Note (Signed)
Pre-operative diagnosis- Osteoarthritis Right hip  Post-operative diagnosis- Osteoarthritis  Right hip  Procedure-  RightTotal Hip Arthroplasty  Surgeon- Dione Plover. Natasha Paulson, MD  Assistant- drew perkins, PA-C   Anesthesia  Spinal  EBL- 150   Drain Hemovac   Complication- None  Condition-PACU - hemodynamically stable.   Brief Clinical Note-  Jeanette Espinoza is a 79 y.o. female with end stage arthritis of her right hip with progressively worsening pain and dysfunction. Pain occurs with activity and rest including pain at night. She has tried analgesics, protected weight bearing and rest without benefit. Pain is too severe to attempt physical therapy. Radiographs demonstrate bone on bone arthritis with subchondral cyst formation. She presents now for right THA.  Procedure in detail-   The patient is brought into the operating room and placed on the operating table. After successful administration of Spinal  anesthesia, the patient is placed in the  Left lateral decubitus position with the  Right side up and held in place with the hip positioner. The lower extremity is isolated from the perineum with plastic drapes and time-out is performed by the surgical team. The lower extremity is then prepped and draped in the usual sterile fashion. A short posterolateral incision is made with a ten blade through the subcutaneous tissue to the level of the fascia lata which is incised in line with the skin incision. The sciatic nerve is palpated and protected and the short external rotators and capsule are isolated from the femur. The hip is then dislocated and the center of the femoral head is marked. A trial prosthesis is placed such that the trial head corresponds to the center of the patients' native femoral head. The resection level is marked on the femoral neck and the resection is made with an oscillating saw. The femoral head is removed and femoral retractors placed to gain access to the femoral  canal.      The canal finder is passed into the femoral canal and the canal is thoroughly irrigated with sterile saline to remove the fatty contents. Axial reaming is performed to 11.5  mm, proximal reaming to 16 F  and the sleeve machined to a small. A 59F small trial sleeve is placed into the proximal femur.      The femur is then retracted anteriorly to gain acetabular exposure. Acetabular retractors are placed and the labrum and osteophytes are removed, Acetabular reaming is performed to 49  mm and a 50  mm Pinnacle acetabular shell is placed in anatomic position with excellent purchase. Additional dome screws  were not needed. The permanent 32 mm neutral + 4 Marathon liner is placed into the acetabular shell.      The trial femur is then placed into the femoral canal. The size is 16 x 11  stem with a 36 + 6  neck and a 32 + 0 head with the neck version matching  the patients' native anteversion. The hip is reduced with excellent stability with full extension and full external rotation, 70 degrees flexion with 40 degrees adduction and 90 degrees internal rotation and 90 degrees of flexion with 70 degrees of internal rotation. The operative leg is placed on top of the non-operative leg and the leg lengths are found to be equal. The trials are then removed and the permanent implant of the same size is impacted into the femoral canal. The  metal femoral head of the same size as the trial is placed and the hip is reduced with the  same stability parameters. The operative leg is again placed on top of the non-operative leg and the leg lengths are found to be equal.      The wound is then copiously irrigated with saline solution and the capsule and short external rotators are re-attached to the femur through drill holes with Ethibond suture. The fascia lata is closed over a hemovac drain with #1 vicryl suture and the fascia lata, gluteal muscles and subcutaneous tissues are injected with Exparel 31ml diluted with  saline 63ml. The subcutaneous tissues are closed with #1 and2-0 vicryl and the subcuticular layer closed with running 4-0 Monocryl. The drain is hooked to suction, incision cleaned and dried, and steri-srips and a bulky sterile dressing applied. The limb is placed into a knee immobilizer and the patient is awakened and transported to recovery in stable condition.      Please note that a surgical assistant was a medical necessity for this procedure in order to perform it in a safe and expeditious manner. The assistant was necessary to provide retraction to the vital neurovascular structures and to retract and position the limb to allow for anatomic placement of the prosthetic components.  Dione Plover Uriah Philipson, MD    12/02/2012, 10:22 AM

## 2012-12-02 NOTE — H&P (View-Only) (Signed)
Jeanette Espinoza  DOB: 1925-05-22 Undefined / Language: Cleophus Molt / Race: White Female  Date of Admission:  12/02/2012  Chief Complaint:  Right Hip Pain  History of Present Illness The patient is a 79 year old female who comes in for a preoperative History and Physical. The patient is scheduled for a right total hip arthroplasty to be performed by Dr. Dione Plover. Aluisio, MD at [facility] on 12/02/2012. The patient is a 79 year old female who presents for follow up of their hip. The patient is being followed for their right hip pain.  Symptoms reported today include: pain. The patient feels that they are doing poorly (Patient states that she didn't get any relief from the injection. She said that she has pain from the knee radiating up her leg when she lifts her right leg.). The hip is definitely holding her back from doing things she desires. She has pain at all times. She would like to be more active but cannot do so because of the hip pain. It's occurring in the groin, radiating into her knee. She's not having any swelling in her knee. She had a knee replaced about 15 years ago but it had not given her any trouble until recently. She had the intra-articular hip injection which did not provide benefit. The hip is definitely having a very negative effect on her lifestyle.  She is ready to get the hip fixed. They have been treated conservatively in the past for the above stated problem and despite conservative measures, they continue to have progressive pain and severe functional limitations and dysfunction. They have failed non-operative management including home exercise, medications, and injections. It is felt that they would benefit from undergoing total joint replacement. Risks and benefits of the procedure have been discussed with the patient and they elect to proceed with surgery. There are no active contraindications to surgery such as ongoing infection or rapidly  progressive neurological disease.   Problem List S/P Right total knee arthroplasty (V43.65) Osteoarthritis, Hip (715.35)  Allergies No Known Drug Allergies   Family History Diabetes Mellitus. father and sister Hypertension. mother   Social History Living situation. live alone live in a facility Exercise. Exercises weekly; does running / walking Exercises monthly; does other Number of flights of stairs before winded. 1 less than 1 Marital status. widowed Alcohol use. current drinker; drinks wine; only occasionally per week current drinker; drinks wine and hard liquor Copy of Drug/Alcohol Rehab (Previously). 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 former smoker Tobacco / smoke exposure. no Children. 3 Illicit drug use. no Drug/Alcohol Rehab (Currently). no Post-Surgical Plans. Patient lives at [facility] and wants to look into the skilled area there. Advance Directives. Living Will, Healthcare POA   Medication History Enalapril Maleate (5MG  Tablet, Oral) Active. Fish Oil Burp-Less (1000MG  Capsule, Oral) Active. Magnesium Gluconate (250MG  Tablet, Oral) Active. Aleve (220MG  Tablet, Oral) Active. Vitamin D ( Oral) Specific dose unknown - Active. Aspirin EC (81MG  Tablet DR, Oral) Active.   Past Surgical History Total Knee Replacement. Date: 38. right Cataract Surgery. right Appendectomy Tonsillectomy Spinal Surgery. Date: 39. Colon Polyp Removal - Colonoscopy Carpal Tunnel Repair. left Total Hip Replacement. Date: 40. left Hysterectomy. partial (non-cancerous)  Medical History High blood pressure Hypercholesterolemia Osteoarthritis Impaired Vision Diet-Controlled Diabetes Mellitus   Review of Systems General:Not Present- Chills, Fever, Night Sweats, Fatigue, Weight Gain, Weight Loss and Memory Loss. Skin:Not Present- Hives, Itching, Rash, Eczema and Lesions. HEENT:Not  Present- Tinnitus, Headache,  Double Vision, Visual Loss, Hearing Loss and Dentures. Respiratory:Not Present- Shortness of breath with exertion, Shortness of breath at rest, Allergies, Coughing up blood and Chronic Cough. Cardiovascular:Not Present- Chest Pain, Racing/skipping heartbeats, Difficulty Breathing Lying Down, Murmur, Swelling and Palpitations. Gastrointestinal:Not Present- Bloody Stool, Heartburn, Abdominal Pain, Vomiting, Nausea, Constipation, Diarrhea, Difficulty Swallowing, Jaundice and Loss of appetitie. Female Genitourinary:Not Present- Blood in Urine, Urinary frequency, Weak urinary stream, Discharge, Flank Pain, Incontinence, Painful Urination, Urgency, Urinary Retention and Urinating at Night. Musculoskeletal:Present- Muscle Pain, Joint Pain, Back Pain and Morning Stiffness. Not Present- Muscle Weakness, Joint Swelling and Spasms. Neurological:Not Present- Tremor, Dizziness, Blackout spells, Paralysis, Difficulty with balance and Weakness. Psychiatric:Not Present- Insomnia.   Vitals Weight: 135 lb Height: 59 in Weight was reported by patient. Height was reported by patient. Body Surface Area: 1.6 m Body Mass Index: 27.27 kg/m Pulse: 60 (Regular) Resp.: 14 (Unlabored) BP: 132/68 (Sitting, Left Arm, Standard)    Physical Exam The physical exam findings are as follows:  Note: Pateint is a 79 year old female with continuned right hip pain.   General Mental Status - Alert, cooperative and good historian. General Appearance- pleasant. Not in acute distress. Orientation- Oriented X3. Build & Nutrition- Well nourished and Well developed.   Head and Neck Head- normocephalic, atraumatic . Neck Global Assessment- supple. no bruit auscultated on the right and no bruit auscultated on the left.   Eye Vision- Wears corrective lenses. Pupil- Bilateral- Regular and Round. Motion- Bilateral- EOMI.   Chest and Lung  Exam Auscultation: Breath sounds:- clear at anterior chest wall and - clear at posterior chest wall. Adventitious sounds:- No Adventitious sounds.   Cardiovascular Auscultation:Rhythm- Regular rate and rhythm. Heart Sounds- S1 WNL and S2 WNL. Murmurs & Other Heart Sounds: Murmur 1:Location- Aortic Area. Timing- Holosystolic. Grade- II/VI. Character- Low pitched.   Abdomen Palpation/Percussion:Tenderness- Abdomen is non-tender to palpation. Rigidity (guarding)- Abdomen is soft. Auscultation:Auscultation of the abdomen reveals - Bowel sounds normal.   Female Genitourinary  Not done, not pertinent to present illness  Musculoskeletal On exam, well-developed female, alert and oriented, in no apparent distress. She appears much younger than her stated age. Evaluation of her left hip is flexion 110, rotation in 10, out 30, abduction 30 without discomfort. Right hip flexion 90, no internal or external rotation, only about 10 degrees of abduction. Her pulses, sensation and motor are intact. Her right knee range is about 5-120, no tenderness or instability.  RADIOGRAPHS: Radiographs from last visit, AP pelvis and lateral of the right hip and she's got some bone on bone change with slight protrusio, right hip. She also has large osteophytes. Her left hip replacement remains in good position. No abnormalities. Right knee replacement is in good position, no abnormalities.  Assessment & Plan Osteoarthritis, Hip (715.35) Impression: Right Hip  Note: Plan is for a Right Total Hip Replacement by Dr. Wynelle Link.  Plan is to go to Rehab. Patient lives in the [place].  PCP - Dr. Unk Pinto - Patient has been seen preoperatively and felt to be stable for surgery.  Signed electronically by Jeneen Montgomery, PA-C

## 2012-12-02 NOTE — Progress Notes (Signed)
Clinical Social Work Department CLINICAL SOCIAL WORK PLACEMENT NOTE 12/02/2012  Patient:  Mcalexander,Arleen H  Account Number:  [id number] Admit date:  12/02/2012  Clinical Social Worker:  Werner Lean, LCSW  Date/time:  12/02/2012 12:00 M  Clinical Social Work is seeking post-discharge placement for this patient at the following level of care:   SKILLED NURSING   (*CSW will update this form in Epic as items are completed)     Patient/family provided with [facility] Department of Clinical Social Work's list of facilities offering this level of care within the geographic area requested by the patient (or if unable, by the patient's family).    Patient/family informed of their freedom to choose among providers that offer the needed level of care, that participate in Medicare, Medicaid or managed care program needed by the patient, have an available bed and are willing to accept the patient.    Patient/family informed of [facility]' ownership interest in [facility], as well as of the fact that they are under no obligation to receive care at this facility.  PASARR submitted to [facility] on  PASARR number received from [place] on   FL2 transmitted to all facilities in geographic area requested by pt/family on  12/02/2012 FL2 transmitted to all facilities within larger geographic area on   Patient informed that his/her managed care company has contracts with or will negotiate with  certain facilities, including the following:     Patient/family informed of bed offers received:  12/02/2012 Patient chooses bed at [facility] Physician recommends and patient chooses bed at    Patient to be transferred to [facility] on   Patient to be transferred to facility by   The following physician request were entered in Epic:   Additional Comments:  PASRR is not required for SNF placement at [facility].  Werner Lean LCSW [phone]

## 2012-12-02 NOTE — Progress Notes (Signed)
Clinical Social Work Department BRIEF PSYCHOSOCIAL ASSESSMENT 12/02/2012  Patient:  Elden,Baley H     Account Number:  [id number]     Admit date:  12/02/2012  Clinical Social Worker:  Lacie Scotts  Date/Time:  12/02/2012 02:44 PM  Referred by:  Physician  Date Referred:  12/02/2012 Referred for  SNF Placement   Other Referral:   Interview type:   Other interview type:    PSYCHOSOCIAL DATA Living Status:  FACILITY Admitted from facility:  [facility] Level of care:  Independent Living Primary support name:  Harlen [employer] Primary support relationship to patient:  CHILD, ADULT Degree of support available:   unclear    CURRENT CONCERNS  Other Concerns:    SOCIAL WORK ASSESSMENT / PLAN Pt is a34 yr old female living at [facility] ( I ) prior to hospitalization. CSW met with pt / son to assist with d/c planning. Pt plans to have rehab at [facility] following hospital d/c. [facility] contacted and d/c plan has been confirmed. CSW will follow to assist with d/c planning needs.   Assessment/plan status:  Psychosocial Support/Ongoing Assessment of Needs Other assessment/ plan:   Information/referral to community resources:   None needed at this time.    PATIENT'S/FAMILY'S RESPONSE TO PLAN OF CARE: Pt is looking forward to returning to [facility] for rehab.     Werner Lean LCSW [phone]

## 2012-12-02 NOTE — Transfer of Care (Signed)
Immediate Anesthesia Transfer of Care Note  Patient: Jeanette Espinoza  Procedure(s) Performed: Procedure(s) (LRB) with comments: TOTAL HIP ARTHROPLASTY (Right)  Patient Location: PACU  Anesthesia Type:Spinal  Level of Consciousness: awake, alert , oriented and patient cooperative  Airway & Oxygen Therapy: Patient Spontanous Breathing and Patient connected to face mask oxygen  Post-op Assessment: Report given to PACU RN and Post -op Vital signs reviewed and stable  Post vital signs: stable  T 10 spinal level  Complications: No apparent anesthesia complications

## 2012-12-02 NOTE — Plan of Care (Signed)
Problem: Consults Goal: Diagnosis- Total Joint Replacement Primary Total Hip     

## 2012-12-02 NOTE — Anesthesia Postprocedure Evaluation (Signed)
  Anesthesia Post-op Note  Patient: Jeanette Espinoza  Procedure(s) Performed: Procedure(s) (LRB): TOTAL HIP ARTHROPLASTY (Right)  Patient Location: PACU  Anesthesia Type: Spinal  Level of Consciousness: awake and alert   Airway and Oxygen Therapy: Patient Spontanous Breathing  Post-op Pain: mild  Post-op Assessment: Post-op Vital signs reviewed, Patient's Cardiovascular Status Stable, Respiratory Function Stable, Patent Airway and No signs of Nausea or vomiting  Last Vitals:  Filed Vitals:   12/02/12 1030  BP: 149/57  Pulse: 64  Temp: 36.4 C  Resp: 19    Post-op Vital Signs: stable   Complications: No apparent anesthesia complications

## 2012-12-03 LAB — BASIC METABOLIC PANEL
BUN: 21 mg/dL (ref 6–23)
Creatinine, Ser: 1.28 mg/dL — ABNORMAL HIGH (ref 0.50–1.10)
GFR calc Af Amer: 43 mL/min — ABNORMAL LOW (ref >90)
GFR calc non Af Amer: 37 mL/min — ABNORMAL LOW (ref >90)
Potassium: 4.2 mEq/L (ref 3.5–5.1)

## 2012-12-03 LAB — GLUCOSE, CAPILLARY: Glucose-Capillary: 95 mg/dL (ref 70–99)

## 2012-12-03 LAB — CBC
HCT: 23.8 % — ABNORMAL LOW (ref 36.0–46.0)
MCHC: 34 g/dL (ref 30.0–36.0)
RDW: 12.3 % (ref 11.5–15.5)

## 2012-12-03 NOTE — Evaluation (Signed)
Physical Therapy Evaluation Patient Details Name: Jeanette Espinoza MRN: [id number] DOB: [birth year] Today's Date: 12/03/2012 Time: 1015-1030 PT Time Calculation (min): 15 min  PT Assessment / Plan / Recommendation Clinical Impression  Pt. is 79 y/o female admitted 12/02/12 for RTHA-posterior. Pt will bene fit from PT while in acute care. Pt. tolerated up to recliner. getting 2 units of blood. [name]    PT Assessment  Patient needs continued PT services    Follow Up Recommendations  SNF    Does the patient have the potential to tolerate intense rehabilitation      Barriers to Discharge Decreased caregiver support      Equipment Recommendations  None recommended by PT    Recommendations for Other Services     Frequency 7X/week    Precautions / Restrictions Precautions Precautions: Posterior Hip Precaution Booklet Issued: Yes (comment) Precaution Comments: Educated on posterior hip precautions. Restrictions Weight Bearing Restrictions: No   Pertinent Vitals/Pain No dizziness, pain minimal      Mobility  Bed Mobility Bed Mobility: Supine to Sit Supine to Sit: 4: Min assist Sit to Supine: 4: Min assist (for R LE) Details for Bed Mobility Assistance: cues for precautions and for turning on bed  Transfers Transfers: Stand Pivot Transfers Sit to Stand: From bed;With upper extremity assist;3: Mod assist Stand to Sit: To chair/3-in-1;4: Min assist Stand Pivot Transfers: 3: Mod assist Details for Transfer Assistance: cues for precautions and for pushing from bed, reachiing to recliner. Pt took a few steps w/ rw to get to recliner. Ambulation/Gait Ambulation/Gait Assistance: 3: Mod assist;Not tested (comment)    Shoulder Instructions     Exercises     PT Diagnosis: Difficulty walking;Generalized weakness;Acute pain  PT Problem List: Decreased strength;Decreased range of motion;Decreased activity tolerance;Decreased mobility;Pain;Decreased knowledge of  precautions;Decreased safety awareness;Decreased knowledge of use of DME PT Treatment Interventions: DME instruction;Gait training;Functional mobility training;Therapeutic activities;Therapeutic exercise;Patient/family education   PT Goals Acute Rehab PT Goals PT Goal Formulation: With patient Time For Goal Achievement: 12/10/12 Potential to Achieve Goals: Good Pt will go Supine/Side to Sit: with supervision Pt will go Sit to Supine/Side: with supervision PT Goal: Sit to Supine/Side - Progress: Goal set today Pt will go Sit to Stand: with supervision PT Goal: Sit to Stand - Progress: Goal set today Pt will go Stand to Sit: with supervision PT Goal: Stand to Sit - Progress: Goal set today Pt will Ambulate: with rolling walker;16 - 50 feet PT Goal: Ambulate - Progress: Goal set today Pt will Perform Home Exercise Program: with supervision, verbal cues required/provided PT Goal: Perform Home Exercise Program - Progress: Goal set today Additional Goals Additional Goal #1: state/demo 3/3 posterior precautions. PT Goal: Additional Goal #1 - Progress: Goal set today  Visit Information  Last PT Received On: 12/03/12 Assistance Needed: +1    Subjective Data  Subjective: I will just ge to the chair Patient Stated Goal: to go to rehab   Prior [place] Lives With: Alone ([facility] (independent living)) Available Help at Discharge: [place] Type of Home: [place] Equipment: Gilford Rile - four wheeled Additional Comments: Pt used 4 wheeled walker inside home, no device outside. Prior Function Level of Independence: Independent Driving: Yes Vocation: Retired Corporate investment banker: No difficulties Dominant Hand: Right    Cognition  Overall Cognitive Status: Appears within functional limits for tasks assessed/performed Arousal/Alertness: Awake/alert Orientation Level: Appears intact for tasks assessed Behavior During Session: [facility] for  tasks performed  Extremity/Trunk Assessment Right Upper Extremity Assessment RUE ROM/Strength/Tone: WFL for tasks assessed (Pt with arthritic changes) RUE Coordination: WFL - gross/fine motor Left Upper Extremity Assessment LUE ROM/Strength/Tone: WFL for tasks assessed (Pt with arthritic changes.) LUE Coordination: WFL - gross/fine motor Right Lower Extremity Assessment RLE ROM/Strength/Tone: Deficits RLE ROM/Strength/Tone Deficits: requires some  assistance for support of RLE RLE Sensation: WFL - Light Touch Left Lower Extremity Assessment LLE ROM/Strength/Tone: WFL for tasks assessed LLE Sensation: WFL - Light Touch   Balance    End of Session PT - End of Session Activity Tolerance: Patient limited by fatigue;Patient limited by pain Patient left: in chair;with call bell/phone within reach Nurse Communication: Mobility status  GP     [name] 12/03/2012, 3:26 PM  [phone]

## 2012-12-03 NOTE — Progress Notes (Signed)
   Subjective: 1 Day Post-Op Procedure(s) (LRB): TOTAL HIP ARTHROPLASTY (Right) Patient reports pain as mild.  Nauseated last night with oxycodone We will start therapy today.  Plan is to go Skilled nursing facility after hospital stay.  Objective: Vital signs in last 24 hours: Temp:  [96.3 F (35.7 C)-98.6 F (37 C)] 98.1 F (36.7 C) (01/25 0600) Pulse Rate:  [56-66] 66  (01/25 0600) Resp:  [10-21] 14  (01/25 0600) BP: (94-149)/(36-88) 117/58 mmHg (01/25 0600) SpO2:  [90 %-100 %] 98 % (01/25 0600) Weight:  [126 lb (57.153 kg)] 126 lb (57.153 kg) (01/24 2331)  Intake/Output from previous day:  Intake/Output Summary (Last 24 hours) at 12/03/12 0812 Last data filed at 12/03/12 0721  Gross per 24 hour  Intake   4130 ml  Output   1744 ml  Net   2386 ml    Intake/Output this shift: Total I/O In: -  Out: 102 [Urine:100; Drains:2]  Labs:  [facility] 12/03/12 0513  HGB 8.1*    Basename 12/03/12 0513  WBC 7.0  RBC 2.54*  HCT 23.8*  PLT 162    Basename 12/03/12 0513  NA 130*  K 4.2  CL 99  CO2 23  BUN 21  CREATININE 1.28*  GLUCOSE 119*  CALCIUM 7.9*   No results found for this basename: LABPT:2,INR:2 in the last 72 hours  EXAM General - Patient is Alert, Appropriate and Oriented Extremity - Neurologically intact Neurovascular intact Dorsiflexion/Plantar flexion intact No cellulitis present Compartment soft Dressing - dressing C/D/I Motor Function - intact, moving foot and toes well on exam.  Hemovac pulled without difficulty.  Past Medical History  Diagnosis Date  . HTN (hypertension)     echo (11/11) with EF 55-60%, mild LV hypertrophy, PA systolic pressure 34 mmHg  . DM2 (diabetes mellitus, type 2)     diet controlled  . HLD (hyperlipidemia)   . Low back pain   . Arthritis     Assessment/Plan: 1 Day Post-Op Procedure(s) (LRB): TOTAL HIP ARTHROPLASTY (Right) Principal Problem:  *OA (osteoarthritis) of hip Active Problems:  Acute blood loss  anemia   Advance diet Up with therapy D/C IV fluids Discharge to SNF when medically ready Transfuse 2 units PRBCs for ABLA  DVT Prophylaxis - Lovenox Weight Bearing As Tolerated right Leg D/C Knee Immobilizer Hemovac Pulled Begin Therapy Hip Preacutions Keep foley until after transfusion No vaccines.  Gearlean Alf

## 2012-12-03 NOTE — Progress Notes (Signed)
Patient refuses removal of foley cath this evening. Patient wishes to wait until tomorrow morning. She states that since she received blood today, she did not receive enough therapy, and feels unsure about removing the catheter. RN educated patient regarding the benefits of early Foley removal and the increased risks for infection if it is left in. Patient still wants to wait until morning to speak with MD on rounds. Output sufficient. Clear and pale yellow. No odor. Will monitor overnight, and attempt removal in the morning. Brownie Nehme [place], [place] 12/03/2012 10:51 PM

## 2012-12-03 NOTE — Progress Notes (Signed)
Physical Therapy Treatment Patient Details Name: Jeanette Espinoza MRN: [id number] DOB: 23-May-1925 Today's Date: 12/03/2012 Time: [id number] PT Time Calculation (min): 28 min  PT Assessment / Plan / Recommendation Comments on Treatment Session  Pt. tolerated short walk. Pt is moving RLE well.     Follow Up Recommendations  SNF     Does the patient have the potential to tolerate intense rehabilitation     Barriers to Discharge Decreased caregiver support      Equipment Recommendations  None recommended by PT    Recommendations for Other Services    Frequency 7X/week   Plan Discharge plan remains appropriate;Frequency remains appropriate    Precautions / Restrictions Precautions Precautions: Posterior Hip Precaution Booklet Issued: Yes (comment) Precaution Comments: Educated on posterior hip precautions. Restrictions Weight Bearing Restrictions: No   Pertinent Vitals/Pain Sore hip on R   Mobility  Bed Mobility Bed Mobility: Supine to Sit Supine to Sit: 4: Min assist Sit to Supine: 4: Min assist Details for Bed Mobility Assistance: RLE onto bed, precautions for hip Transfers Transfers: Sit to Stand;Stand to Sit Sit to Stand: With upper extremity assist;From chair/3-in-1;4: Min assist Stand to Sit: To bed;With upper extremity assist;4: Min assist Stand Pivot Transfers: 3: Mod assist Details for Transfer Assistance: cues for body position to turn at edge of bed., precautions Ambulation/Gait Ambulation/Gait Assistance: 4: Min assist Ambulation Distance (Feet): 20 Feet Assistive device: Rolling walker Ambulation/Gait Assistance Details: cues for sequence and posture Gait Pattern: Step-to pattern;Decreased step length - right;Decreased stance time - right;Antalgic Gait velocity: decreased    Exercises Total Joint Exercises Ankle Circles/Pumps: AROM;Right;10 reps;Supine Quad Sets: AROM;Right;10 reps;Supine Short Arc Quad: AROM;Right;10 reps;Supine Heel Slides:  AAROM;Right;10 reps;Supine Hip ABduction/ADduction: AAROM;Right;10 reps;Supine   PT Diagnosis: Difficulty walking;Generalized weakness;Acute pain  PT Problem List: Decreased strength;Decreased range of motion;Decreased activity tolerance;Decreased mobility;Pain;Decreased knowledge of precautions;Decreased safety awareness;Decreased knowledge of use of DME PT Treatment Interventions: DME instruction;Gait training;Functional mobility training;Therapeutic activities;Therapeutic exercise;Patient/family education   PT Goals Acute Rehab PT Goals PT Goal Formulation: With patient Time For Goal Achievement: 12/10/12 Potential to Achieve Goals: Good Pt will go Supine/Side to Sit: with supervision Pt will go Sit to Supine/Side: with supervision PT Goal: Sit to Supine/Side - Progress: Progressing toward goal Pt will go Sit to Stand: with supervision PT Goal: Sit to Stand - Progress: Progressing toward goal Pt will go Stand to Sit: with supervision PT Goal: Stand to Sit - Progress: Progressing toward goal Pt will Ambulate: 16 - 50 feet;with rolling walker;with supervision PT Goal: Ambulate - Progress: Progressing toward goal Pt will Perform Home Exercise Program: with supervision, verbal cues required/provided PT Goal: Perform Home Exercise Program - Progress: Progressing toward goal Additional Goals Additional Goal #1: state/demo 3/3 posterior precautions PT Goal: Additional Goal #1 - Progress: Progressing toward goal  Visit Information  Last PT Received On: 12/03/12 Assistance Needed: +1    Subjective Data  Subjective: I might walk a little Patient Stated Goal: to go to rehab   Cognition  Overall Cognitive Status: Appears within functional limits for tasks assessed/performed Arousal/Alertness: Awake/alert Orientation Level: Appears intact for tasks assessed Behavior During Session: [facility] for tasks performed    Balance     End of Session PT - End of Session Activity Tolerance: Patient  tolerated treatment well Patient left: in bed;with call bell/phone within reach;with family/visitor present Nurse Communication: Mobility status   GP     [name] 12/03/2012, 3:32 PM

## 2012-12-04 LAB — CBC
HCT: 31.1 % — ABNORMAL LOW (ref 36.0–46.0)
Hemoglobin: 11 g/dL — ABNORMAL LOW (ref 12.0–15.0)
MCH: 30.7 pg (ref 26.0–34.0)
MCV: 86.9 fL (ref 78.0–100.0)
RBC: 3.58 MIL/uL — ABNORMAL LOW (ref 3.87–5.11)

## 2012-12-04 LAB — BASIC METABOLIC PANEL
BUN: 22 mg/dL (ref 6–23)
CO2: 20 mEq/L (ref 19–32)
Calcium: 8 mg/dL — ABNORMAL LOW (ref 8.4–10.5)
Creatinine, Ser: 1.29 mg/dL — ABNORMAL HIGH (ref 0.50–1.10)
Glucose, Bld: 153 mg/dL — ABNORMAL HIGH (ref 70–99)

## 2012-12-04 LAB — TYPE AND SCREEN
ABO/RH(D): O POS
Unit division: 0

## 2012-12-04 LAB — GLUCOSE, CAPILLARY: Glucose-Capillary: 119 mg/dL — ABNORMAL HIGH (ref 70–99)

## 2012-12-04 MED ORDER — POLYETHYLENE GLYCOL 3350 17 G PO PACK: 17.0000 g | PACK | Freq: Every day | ORAL | Status: DC | PRN

## 2012-12-04 MED ORDER — METHOCARBAMOL 500 MG PO TABS: 500.0000 mg | ORAL_TABLET | Freq: Four times a day (QID) | ORAL | Status: DC | PRN

## 2012-12-04 MED ORDER — BISACODYL 10 MG RE SUPP: 10.0000 mg | Freq: Every day | RECTAL | Status: DC | PRN

## 2012-12-04 MED ORDER — DSS 100 MG PO CAPS: 100.0000 mg | ORAL_CAPSULE | Freq: Two times a day (BID) | ORAL | Status: DC

## 2012-12-04 MED ORDER — ENOXAPARIN SODIUM 40 MG/0.4ML ~~LOC~~ SOLN: 40.0000 mg | Freq: Every day | SUBCUTANEOUS | Status: DC

## 2012-12-04 MED ORDER — ONDANSETRON HCL 4 MG PO TABS: 4.0000 mg | ORAL_TABLET | Freq: Four times a day (QID) | ORAL | Status: DC | PRN

## 2012-12-04 MED ORDER — ACETAMINOPHEN 325 MG PO TABS: 650.0000 mg | ORAL_TABLET | Freq: Four times a day (QID) | ORAL | Status: DC | PRN

## 2012-12-04 MED ORDER — TRAMADOL HCL 50 MG PO TABS: 50.0000 mg | ORAL_TABLET | Freq: Four times a day (QID) | ORAL | Status: DC | PRN

## 2012-12-04 MED ORDER — ENOXAPARIN SODIUM 40 MG/0.4ML ~~LOC~~ SOLN
40.0000 mg | Freq: Every day | SUBCUTANEOUS | Status: DC
  Administered 2012-12-05: 40 mg via SUBCUTANEOUS
  Filled 2012-12-04: qty 0.4

## 2012-12-04 NOTE — Progress Notes (Signed)
Physical Therapy Treatment Patient Details Name: Jeanette Espinoza MRN: [id number] DOB: [birth year] Today's Date: 12/04/2012 Time: 0800-0826 PT Time Calculation (min): 26 min  PT Assessment / Plan / Recommendation Comments on Treatment Session  Pt is improving in mobility, has minimal pain. Pt plans SNF.    Follow Up Recommendations  SNF     Does the patient have the potential to tolerate intense rehabilitation     Barriers to Discharge        Equipment Recommendations  None recommended by PT    Recommendations for Other Services    Frequency 7X/week   Plan Frequency remains appropriate;Discharge plan remains appropriate    Precautions / Restrictions Precautions Precautions: Posterior Hip   Pertinent Vitals/Pain No c/o    Mobility  Bed Mobility Supine to Sit: 4: Min assist;HOB elevated;With rails Details for Bed Mobility Assistance: cues for posterior hip precautuions and cues for UE use to push to upright. Transfers Sit to Stand: 4: Min assist;With upper extremity assist;From bed Stand to Sit: With armrests;To chair/3-in-1;With upper extremity assist;4: Min assist Details for Transfer Assistance: cues to push from bed and reach back to recliner. cues for RLE position to sit down. Ambulation/Gait Ambulation/Gait Assistance: 4: Min assist (+1 to follow with recliner) Ambulation Distance (Feet): 100 Feet Assistive device: Rolling walker Ambulation/Gait Assistance Details: cues for sequence and posture  Gait Pattern: Step-to pattern;Antalgic    Exercises     PT Diagnosis:    PT Problem List:   PT Treatment Interventions:     PT Goals Acute Rehab PT Goals Pt will go Supine/Side to Sit: with supervision PT Goal: Supine/Side to Sit - Progress: Progressing toward goal Pt will go Sit to Stand: with supervision PT Goal: Sit to Stand - Progress: Progressing toward goal Pt will go Stand to Sit: with supervision PT Goal: Stand to Sit - Progress: Progressing toward goal Pt  will Ambulate: with rolling walker;with supervision;51 - 150 feet PT Goal: Ambulate - Progress: Progressing toward goal Additional Goals Additional Goal #1: state/demo 3/3 posterior precautions   Visit Information  Last PT Received On: 12/04/12 Assistance Needed: +1    Subjective Data  Subjective: I just didn't think I could get up last nite   Cognition  Overall Cognitive Status: Appears within functional limits for tasks assessed/performed    Balance     End of Session PT - End of Session Activity Tolerance: Patient tolerated treatment well Patient left: in chair;with call bell/phone within reach Nurse Communication: Mobility status   GP     [name] 12/04/2012, 2:37 PM

## 2012-12-04 NOTE — Progress Notes (Signed)
Physical Therapy Treatment Patient Details Name: Jeanette Espinoza MRN: [id number] DOB: May 22, 1925 Today's Date: 12/04/2012 Time: [id number] PT Time Calculation (min): 10 min  PT Assessment / Plan / Recommendation Comments on Treatment Session  Assisted pt out of recliner, amb to anf from bathroom then back to bed.  Pt plans to D/C to SNF.    Follow Up Recommendations  SNF     Does the patient have the potential to tolerate intense rehabilitation     Barriers to Discharge        Equipment Recommendations  None recommended by PT    Recommendations for Other Services    Frequency 7X/week   Plan Frequency remains appropriate;Discharge plan remains appropriate    Precautions / Restrictions Precautions Precautions: Posterior Hip   Pertinent Vitals/Pain C/o 6/10 during gait    Mobility  Bed Mobility Bed Mobility: Sit to Supine Supine to Sit: 4: Min assist;HOB elevated;With rails Sit to Supine: 3: Mod assist Details for Bed Mobility Assistance: Assisted pt back to bed Transfers Transfers: Sit to Stand;Stand to Sit Sit to Stand: 4: Min assist Stand to Sit: 4: Min assist Details for Transfer Assistance: 75% VC's on Posterior Hip percaustions with sit to stand and stand to sit. Ambulation/Gait Ambulation/Gait Assistance: 4: Min assist (+1 to follow with recliner) Ambulation Distance (Feet): 20 Feet (10' x 2 to and from BR) Assistive device: Rolling walker Ambulation/Gait Assistance Details: amb to and from BR with 50% VC's on safety with turns and increased time. Gait Pattern: Step-to pattern;Trunk flexed;Decreased stance time - right Gait velocity: decreased     PT Goals Acute Rehab PT Goals Pt will go Supine/Side to Sit: with supervision PT Goal: Supine/Side to Sit - Progress: Progressing toward goal Pt will go Sit to Stand: with supervision PT Goal: Sit to Stand - Progress: Progressing toward goal Pt will go Stand to Sit: with supervision PT Goal: Stand to Sit -  Progress: Progressing toward goal Pt will Ambulate: with rolling walker;with supervision;51 - 150 feet PT Goal: Ambulate - Progress: Progressing toward goal Additional Goals Additional Goal #1: state/demo 3/3 posterior precautions   Visit Information  Last PT Received On: 12/04/12 Assistance Needed: +1             End of Session PT - End of Session Equipment Utilized During Treatment: Gait belt Activity Tolerance: Patient tolerated treatment well Patient left: in bed;with call bell/phone within reach Nurse Communication: Mobility status   Rica Koyanagi  PTA [facility]  Acute  Rehab Pager      [phone]

## 2012-12-04 NOTE — Discharge Summary (Signed)
Physician Discharge Summary   Patient ID: Jeanette Espinoza MRN: [id number] DOB/AGE: [birth year] 77 y.o.  Admit date: 12/02/2012 Discharge date: 12/05/2012  Primary Diagnosis:  Osteoarthritis Right hip  Admission Diagnoses:  Past Medical History  Diagnosis Date  . HTN (hypertension)     echo (11/11) with EF 55-60%, mild LV hypertrophy, PA systolic pressure 34 mmHg  . DM2 (diabetes mellitus, type 2)     diet controlled  . HLD (hyperlipidemia)   . Low back pain   . Arthritis    Discharge Diagnoses:   Principal Problem:  *OA (osteoarthritis) of hip Active Problems:  Acute blood loss anemia  Postop Hyponatremia  Postop Transfusion  Estimated Body mass index is 25.45 kg/(m^2) as calculated from the following:   Height as of this encounter: 4\' 11" (1.499 m).   Weight as of this encounter: 126 lb(57.153 kg).  Classification of overweight in adults according to BMI (WHO, 1998)   Procedure: Procedure(s) (LRB): TOTAL HIP ARTHROPLASTY (Right)   Consults: None  HPI: Jeanette Espinoza is a 79 y.o. female with end stage arthritis of her right hip with progressively worsening pain and dysfunction. Pain occurs with activity and rest including pain at night. She has tried analgesics, protected weight bearing and rest without benefit. Pain is too severe to attempt physical therapy. Radiographs demonstrate bone on bone arthritis with subchondral cyst formation. She presents now for right THA.  Laboratory Data: Admission on 12/02/2012  Component Date Value Range Status  . ABO/RH(D) 12/02/2012 O POS   Final  . Antibody Screen 12/02/2012 NEG   Final  . Sample Expiration 12/02/2012 12/05/2012   Final  . Unit Number 12/02/2012 [id number]   Final  . Blood Component Type 12/02/2012 RED CELLS,LR   Final  . Unit division 12/02/2012 00   Final  . Status of Unit 12/02/2012 ISSUED,FINAL   Final  . Transfusion Status 12/02/2012 OK TO TRANSFUSE   Final  . Crossmatch Result 12/02/2012 Compatible    Final  . Unit Number 12/02/2012 [id number]   Final  . Blood Component Type 12/02/2012 RED CELLS,LR   Final  . Unit division 12/02/2012 00   Final  . Status of Unit 12/02/2012 ISSUED,FINAL   Final  . Transfusion Status 12/02/2012 OK TO TRANSFUSE   Final  . Crossmatch Result 12/02/2012 Compatible   Final  . ABO/RH(D) 12/02/2012 O POS   Final  . Glucose-Capillary 12/02/2012 106* 70 - 99 mg/dL Final  . Glucose-Capillary 12/02/2012 98  70 - 99 mg/dL Final  . WBC 12/03/2012 7.0  4.0 - 10.5 K/uL Final  . RBC 12/03/2012 2.54* 3.87 - 5.11 MIL/uL Final  . Hemoglobin 12/03/2012 8.1* 12.0 - 15.0 g/dL Final  . HCT 12/03/2012 23.8* 36.0 - 46.0 % Final  . MCV 12/03/2012 93.7  78.0 - 100.0 fL Final  . MCH 12/03/2012 31.9  26.0 - 34.0 pg Final  . MCHC 12/03/2012 34.0  30.0 - 36.0 g/dL Final  . RDW 12/03/2012 12.3  11.5 - 15.5 % Final  . Platelets 12/03/2012 162  150 - 400 K/uL Final  . Sodium 12/03/2012 130* 135 - 145 mEq/L Final  . Potassium 12/03/2012 4.2  3.5 - 5.1 mEq/L Final  . Chloride 12/03/2012 99  96 - 112 mEq/L Final  . CO2 12/03/2012 23  19 - 32 mEq/L Final  . Glucose, Bld 12/03/2012 119* 70 - 99 mg/dL Final  . BUN 12/03/2012 21  6 - 23 mg/dL Final  . Creatinine, Ser 12/03/2012 1.28* 0.50 - 1.10 mg/dL  Final  . Calcium 12/03/2012 7.9* 8.4 - 10.5 mg/dL Final  . GFR calc non Af Amer 12/03/2012 37* >90 mL/min Final  . GFR calc Af Amer 12/03/2012 43* >90 mL/min Final   Comment:                                 The eGFR has been calculated                          using the CKD EPI equation.                          This calculation has not been                          validated in all clinical                          situations.                          eGFR's persistently                          <90 mL/min signify                          possible Chronic Kidney Disease.  . Glucose-Capillary 12/02/2012 151* 70 - 99 mg/dL Final  . Glucose-Capillary 12/02/2012 176* 70 - 99 mg/dL Final   . Glucose-Capillary 12/03/2012 95  70 - 99 mg/dL Final  . Order Confirmation 12/03/2012 ORDER PROCESSED BY BLOOD BANK   Final  . Glucose-Capillary 12/03/2012 212* 70 - 99 mg/dL Final  . WBC 12/04/2012 9.8  4.0 - 10.5 K/uL Final  . RBC 12/04/2012 3.58* 3.87 - 5.11 MIL/uL Final  . Hemoglobin 12/04/2012 11.0* 12.0 - 15.0 g/dL Final   POST TRANSFUSION SPECIMEN  . HCT 12/04/2012 31.1* 36.0 - 46.0 % Final  . MCV 12/04/2012 86.9  78.0 - 100.0 fL Final   POST TRANSFUSION SPECIMEN  . MCH 12/04/2012 30.7  26.0 - 34.0 pg Final  . MCHC 12/04/2012 35.4  30.0 - 36.0 g/dL Final  . RDW 12/04/2012 13.7  11.5 - 15.5 % Final  . Platelets 12/04/2012 159  150 - 400 K/uL Final  . Sodium 12/04/2012 129* 135 - 145 mEq/L Final  . Potassium 12/04/2012 3.8  3.5 - 5.1 mEq/L Final  . Chloride 12/04/2012 98  96 - 112 mEq/L Final  . CO2 12/04/2012 20  19 - 32 mEq/L Final  . Glucose, Bld 12/04/2012 153* 70 - 99 mg/dL Final  . BUN 12/04/2012 22  6 - 23 mg/dL Final  . Creatinine, Ser 12/04/2012 1.29* 0.50 - 1.10 mg/dL Final  . Calcium 12/04/2012 8.0* 8.4 - 10.5 mg/dL Final  . GFR calc non Af Amer 12/04/2012 37* >90 mL/min Final  . GFR calc Af Amer 12/04/2012 43* >90 mL/min Final   Comment:                                 The eGFR has been calculated  using the CKD EPI equation.                          This calculation has not been                          validated in all clinical                          situations.                          eGFR's persistently                          <90 mL/min signify                          possible Chronic Kidney Disease.  . Glucose-Capillary 12/03/2012 239* 70 - 99 mg/dL Final  . Glucose-Capillary 12/04/2012 131* 70 - 99 mg/dL Final  . Comment 1 12/04/2012 Documented in Chart   Final  . Comment 2 12/04/2012 Notify RN   Final  . Glucose-Capillary 12/04/2012 119* 70 - 99 mg/dL Final  Hospital Outpatient Visit on 11/24/2012  Component Date Value  Range Status  . aPTT 11/24/2012 30  24 - 37 seconds Final  . WBC 11/24/2012 7.0  4.0 - 10.5 K/uL Final  . RBC 11/24/2012 3.77* 3.87 - 5.11 MIL/uL Final  . Hemoglobin 11/24/2012 11.9* 12.0 - 15.0 g/dL Final  . HCT 11/24/2012 34.6* 36.0 - 46.0 % Final  . MCV 11/24/2012 91.8  78.0 - 100.0 fL Final  . MCH 11/24/2012 31.6  26.0 - 34.0 pg Final  . MCHC 11/24/2012 34.4  30.0 - 36.0 g/dL Final  . RDW 11/24/2012 11.7  11.5 - 15.5 % Final  . Platelets 11/24/2012 245  150 - 400 K/uL Final  . Sodium 11/24/2012 130* 135 - 145 mEq/L Final  . Potassium 11/24/2012 4.8  3.5 - 5.1 mEq/L Final  . Chloride 11/24/2012 95* 96 - 112 mEq/L Final  . CO2 11/24/2012 24  19 - 32 mEq/L Final  . Glucose, Bld 11/24/2012 128* 70 - 99 mg/dL Final  . BUN 11/24/2012 26* 6 - 23 mg/dL Final  . Creatinine, Ser 11/24/2012 1.28* 0.50 - 1.10 mg/dL Final  . Calcium 11/24/2012 9.9  8.4 - 10.5 mg/dL Final  . Total Protein 11/24/2012 7.5  6.0 - 8.3 g/dL Final  . Albumin 11/24/2012 3.8  3.5 - 5.2 g/dL Final  . AST 11/24/2012 15  0 - 37 U/L Final  . ALT 11/24/2012 12  0 - 35 U/L Final  . Alkaline Phosphatase 11/24/2012 65  39 - 117 U/L Final  . Total Bilirubin 11/24/2012 0.3  0.3 - 1.2 mg/dL Final  . GFR calc non Af Amer 11/24/2012 37* >90 mL/min Final  . GFR calc Af Amer 11/24/2012 43* >90 mL/min Final   Comment:                                 The eGFR has been calculated                          using the CKD EPI equation.  This calculation has not been                          validated in all clinical                          situations.                          eGFR's persistently                          <90 mL/min signify                          possible Chronic Kidney Disease.  Marland Kitchen Prothrombin Time 11/24/2012 12.6  11.6 - 15.2 seconds Final  . INR 11/24/2012 0.95  0.00 - 1.49 Final  . Color, Urine 11/24/2012 YELLOW  YELLOW Final  . APPearance 11/24/2012 CLEAR  CLEAR Final  . Specific  Gravity, Urine 11/24/2012 1.011  1.005 - 1.030 Final  . pH 11/24/2012 7.5  5.0 - 8.0 Final  . Glucose, UA 11/24/2012 NEGATIVE  NEGATIVE mg/dL Final  . Hgb urine dipstick 11/24/2012 NEGATIVE  NEGATIVE Final  . Bilirubin Urine 11/24/2012 NEGATIVE  NEGATIVE Final  . Ketones, ur 11/24/2012 NEGATIVE  NEGATIVE mg/dL Final  . Protein, ur 11/24/2012 NEGATIVE  NEGATIVE mg/dL Final  . Urobilinogen, UA 11/24/2012 0.2  0.0 - 1.0 mg/dL Final  . Nitrite 11/24/2012 NEGATIVE  NEGATIVE Final  . Leukocytes, UA 11/24/2012 TRACE* NEGATIVE Final  . MRSA, PCR 11/24/2012 NEGATIVE  NEGATIVE Final  . Staphylococcus aureus 11/24/2012 NEGATIVE  NEGATIVE Final   Comment:                                 The Xpert SA Assay (FDA                          approved for NASAL specimens                          in patients over 20 years of age),                          is one component of                          a comprehensive surveillance                          program.  Test performance has                          been validated by [employer] for patients greater                          than or equal to 33 year old.  It is not intended                          to diagnose infection nor to                          guide or monitor treatment.  . Squamous Epithelial / LPF 11/24/2012 RARE  RARE Final  . WBC, UA 11/24/2012 0-2  <3 WBC/hpf Final  . Bacteria, UA 11/24/2012 RARE  RARE Final     X-Rays:Dg Chest 2 View  11/24/2012  *RADIOLOGY REPORT*  Clinical Data: Hypertension.  Preop for hip replacement.  CHEST - 2 VIEW  Comparison: None.  Findings: The heart size is normal.  Mild interstitial coarsening is likely chronic.  No focal airspace disease is evident. Degenerative changes are noted in the thoracic spine.  IMPRESSION:  1.  No acute cardiopulmonary disease. 2.  Mild degenerative changes of the thoracic spine.   Original Report Authenticated By: San Morelle, M.D.    Dg Hip Complete Right  11/24/2012  *RADIOLOGY REPORT*  Clinical Data: Preoperative assessment for right total hip replacement  RIGHT HIP - COMPLETE 2+ VIEW  Comparison: 07/25/2009  Findings: Acetabular and femoral components of a left hip prosthesis are identified in expected positions. Osseous demineralization. Joint space narrowing and spur formation at right hip. No acute fracture or dislocation. Pelvis intact. Degenerative disc and facet disease changes lumbar spine. Scattered vascular calcifications.  IMPRESSION: Osteoarthritic changes right hip. Osseous demineralization. Prior left hip replacement.   Original Report Authenticated By: Lavonia Dana, M.D.    Dg Pelvis Portable  12/02/2012  *RADIOLOGY REPORT*  Clinical Data: Postop.  PORTABLE PELVIS  Comparison: 07/25/2009  Findings: Interval changes of right hip replacement.  Remote changes of left hip replacement.  No hardware or bony complicating feature.  Soft tissue drain in place on the right.  IMPRESSION: Changes of new right hip replacement.  No complicating feature.   Original Report Authenticated By: Rolm Baptise, M.D.    Dg Hip Portable 1 View Right  12/02/2012  *RADIOLOGY REPORT*  Clinical Data: Postop.  PORTABLE RIGHT HIP - 1 VIEW  Comparison: Pelvic image performed today.  Findings: Changes of right hip replacement.  Normal AP alignment. No hardware or bony complicating feature.  Soft tissue drain in place.  IMPRESSION: Right hip replacement.  No complicating feature.   Original Report Authenticated By: Rolm Baptise, M.D.     EKG: Orders placed in visit on 08/27/10  . CONVERTED CEMR EKG  . CONVERTED CEMR EKG     Hospital Course: Patient was admitted to [facility] and taken to the OR and underwent the above state procedure without complications.  Patient tolerated the procedure well and was later transferred to the recovery room and then to the orthopaedic floor for postoperative care.  They were given PO and  IV analgesics for pain control following their surgery.  They were given 24 hours of postoperative antibiotics of  Anti-infectives     Start     Dose/Rate Route Frequency Ordered Stop   12/02/12 1500   ceFAZolin (ANCEF) IVPB 1 g/50 mL premix        1 g 100 mL/hr over 30 Minutes Intravenous Every 6 hours 12/02/12 1253 12/02/12 2123   12/02/12 0700   ceFAZolin (ANCEF) IVPB 2 g/50 mL premix        2 g 100 mL/hr over 30 Minutes Intravenous 60 min pre-op  12/02/12 0650 12/02/12 0918         and started on DVT prophylaxis in the form of Lovenox.   PT and OT were ordered for total hip protocol.  The patient was allowed to be WBAT with therapy. Discharge planning was consulted to help with postop disposition and equipment needs.  Patient had a tough night on the evening of surgery due to nause but started to get up OOB with therapy on day one and walk about 20 feet.  Hemovac drain was pulled without difficulty.  The knee immobilizer was removed and discontinued.  Continued to work with therapy into day two walking about 100 feet.  Dressing was changed on day two and the incision was healing well.  By day three, the patient had progressed with therapy and meeting their goals.  Incision was healing well.  Patient was seen in rounds by Dr. Wynelle Link and was ready to go to the SNF, [facility] in [place], [place].   Discharge Medications: Prior to Admission medications   Medication Sig Start Date End Date Taking? Authorizing Provider  bisoprolol-hydrochlorothiazide (ZIAC) 5-6.25 MG per tablet Take 1 tablet by mouth every morning.   Yes Historical Provider, MD  enalapril (VASOTEC) 20 MG tablet Take 20 mg by mouth at bedtime.   Yes Historical Provider, MD  acetaminophen (TYLENOL) 325 MG tablet Take 2 tablets (650 mg total) by mouth every 6 (six) hours as needed (or Fever >/= 101). 12/04/12   Alexzandrew Dara Lords, PA  bisacodyl (DULCOLAX) 10 MG suppository Place 1 suppository (10 mg total) rectally daily as needed.  12/04/12   Alexzandrew Perkins, PA  docusate sodium 100 MG CAPS Take 100 mg by mouth 2 (two) times daily. 12/04/12   Alexzandrew Perkins, PA  enoxaparin (LOVENOX) 40 MG/0.4ML injection Inject 0.4 mLs (40 mg total) into the skin daily. Take Lovenox for seven more days, then switch over to 325 mg Aspirin daily for four weeks.  After the four weeks, then resume the 81 mg Aspirin. 12/04/12   Alexzandrew Dara Lords, PA  methocarbamol (ROBAXIN) 500 MG tablet Take 1 tablet (500 mg total) by mouth every 6 (six) hours as needed. 12/04/12   Alexzandrew Perkins, PA  ondansetron (ZOFRAN) 4 MG tablet Take 1 tablet (4 mg total) by mouth every 6 (six) hours as needed for nausea. 12/04/12   Alexzandrew Perkins, PA  polyethylene glycol (MIRALAX / GLYCOLAX) packet Take 17 g by mouth daily as needed. 12/04/12   Alexzandrew Dara Lords, PA  traMADol (ULTRAM) 50 MG tablet Take 1-2 tablets (50-100 mg total) by mouth every 6 (six) hours as needed (mild pain). 12/04/12   Alexzandrew Dara Lords, PA    Diet: Cardiac diet and Diabetic diet Activity:WBAT No bending hip over 90 degrees- A "L" Angle Do not cross legs Do not let foot roll inward When turning these patients a pillow should be placed between the patient's legs to prevent crossing. Patients should have the affected knee fully extended when trying to sit or stand from all surfaces to prevent excessive hip flexion. When ambulating and turning toward the affected side the affected leg should have the toes turned out prior to moving the walker and the rest of patient's body as to prevent internal rotation/ turning in of the leg. Abduction pillows are the most effective way to prevent a patient from not crossing legs or turning toes in at rest. If an abduction pillow is not ordered placing a regular pillow length wise between the patient's legs is also an effective reminder. It  is imperative that these precautions be maintained so that the surgical hip does not dislocate. Follow-up:in  2 weeks Disposition - Skilled nursing facility - Skilled Unit at [facility] Discharged Condition: good   Discharge Orders    Future Orders Please Complete By Expires   Diet - low sodium heart healthy      Diet Carb Modified      Call MD / Call 911      Comments:   If you experience chest pain or shortness of breath, CALL 911 and be transported to the hospital emergency room.  If you develope a fever above 101 F, pus (white drainage) or increased drainage or redness at the wound, or calf pain, call your surgeon's office.   Discharge instructions      Comments:   Pick up stool softner and laxative for home. Do not submerge incision under water. May shower. Continue to use ice for pain and swelling from surgery. Hip precautions.  Total Hip Protocol.  Take Lovenox injections for seven more days, then start a 325 mg Aspirin daily for four weeks.  After the four weeks, resume the 81 mg Aspirin.  When discharged from the skilled rehab facility, please have the facility set up the patient's [place] prior to being released.  Also provide the patient with their medications at time of release from the facility to include their pain medication, the muscle relaxants, and their blood thinner medication.  If the patient is still at the rehab facility at time of follow up appointment, please also assist the patient in arranging follow up appointment in our office and any transportation needs.   Constipation Prevention      Comments:   Drink plenty of fluids.  Prune juice may be helpful.  You may use a stool softener, such as Colace (over the counter) 100 mg twice a day.  Use MiraLax (over the counter) for constipation as needed.   Increase activity slowly as tolerated      Patient may shower      Comments:   You may shower without a dressing once there is no drainage.  Do not wash over the wound.  If drainage remains, do not shower until drainage stops.   Weight bearing as  tolerated      Driving restrictions      Comments:   No driving until released by the physician.   Lifting restrictions      Comments:   No lifting until released by the physician.   Follow the hip precautions as taught in Physical Therapy      Change dressing      Comments:   You may change your dressing dressing daily with sterile 4 x 4 inch gauze dressing and paper tape.  Do not submerge the incision under water.   TED hose      Comments:   Use stockings (TED hose) for 3 weeks on both leg(s).  You may remove them at night for sleeping.   Do not sit on low chairs, stoools or toilet seats, as it may be difficult to get up from low surfaces          Medication List     As of 12/04/2012  9:53 PM    STOP taking these medications         aspirin 81 MG EC tablet      fish oil-omega-3 fatty acids 1000 MG capsule      Magnesium 250 MG Tabs  naproxen sodium 220 MG tablet   Commonly known as: ANAPROX      Vitamin D 2000 UNITS tablet      TAKE these medications         acetaminophen 325 MG tablet   Commonly known as: TYLENOL   Take 2 tablets (650 mg total) by mouth every 6 (six) hours as needed (or Fever >/= 101).      bisacodyl 10 MG suppository   Commonly known as: DULCOLAX   Place 1 suppository (10 mg total) rectally daily as needed.      bisoprolol-hydrochlorothiazide 5-6.25 MG per tablet   Commonly known as: ZIAC   Take 1 tablet by mouth every morning.      DSS 100 MG Caps   Take 100 mg by mouth 2 (two) times daily.      enalapril 20 MG tablet   Commonly known as: VASOTEC   Take 20 mg by mouth at bedtime.      enoxaparin 40 MG/0.4ML injection   Commonly known as: LOVENOX   Inject 0.4 mLs (40 mg total) into the skin daily. Take Lovenox for seven more days, then switch over to 325 mg Aspirin daily for four weeks.  After the four weeks, then resume the 81 mg Aspirin.      methocarbamol 500 MG tablet   Commonly known as: ROBAXIN   Take 1 tablet (500 mg total)  by mouth every 6 (six) hours as needed.      ondansetron 4 MG tablet   Commonly known as: ZOFRAN   Take 1 tablet (4 mg total) by mouth every 6 (six) hours as needed for nausea.      polyethylene glycol packet   Commonly known as: MIRALAX / GLYCOLAX   Take 17 g by mouth daily as needed.      traMADol 50 MG tablet   Commonly known as: ULTRAM   Take 1-2 tablets (50-100 mg total) by mouth every 6 (six) hours as needed (mild pain).           Follow-up Information    Follow up with Gearlean Alf, MD. Schedule an appointment as soon as possible for a visit in 2 weeks.   Contact information:   [address] [place] [address] [phone]          Signed: Mickel Crow 12/04/2012, 9:53 PM

## 2012-12-04 NOTE — Progress Notes (Signed)
   Subjective: 2 Days Post-Op Procedure(s) (LRB): TOTAL HIP ARTHROPLASTY (Right)   Patient reports pain as mild, pain well controlled. No events throughout the night. Feeling better after transfusion.  Objective:   VITALS:   Filed Vitals:   12/04/12 0600  BP: 123/70  Pulse: 67  Temp: 98.2 F (36.8 C)  Resp: 18    Neurovascular intact Dorsiflexion/Plantar flexion intact Incision: dressing C/D/I No cellulitis present Compartment soft  LABS  Basename 12/04/12 0417 12/03/12 0513  HGB 11.0* 8.1*  HCT 31.1* 23.8*  WBC 9.8 7.0  PLT 159 162     Basename 12/04/12 0417 12/03/12 0513  NA 129* 130*  K 3.8 4.2  BUN 22 21  CREATININE 1.29* 1.28*  GLUCOSE 153* 119*     Assessment/Plan: 2 Days Post-Op Procedure(s) (LRB): TOTAL HIP ARTHROPLASTY (Right)  Foley cath d/c'ed Up with therapy Discharge to SNF eventually, when ready   [place]. Maitri Schnoebelen   PAC  12/04/2012, 9:25 AM

## 2012-12-05 ENCOUNTER — Encounter (HOSPITAL_COMMUNITY): Payer: Self-pay | Admitting: Orthopedic Surgery

## 2012-12-05 LAB — CBC
HCT: 29.3 % — ABNORMAL LOW (ref 36.0–46.0)
Hemoglobin: 10.3 g/dL — ABNORMAL LOW (ref 12.0–15.0)
RDW: 13.9 % (ref 11.5–15.5)
WBC: 9 10*3/uL (ref 4.0–10.5)

## 2012-12-05 NOTE — Progress Notes (Signed)
Report called to [place] RN

## 2012-12-05 NOTE — Care Management Note (Signed)
    Page 1 of 1   12/05/2012     5:37:04 PM   CARE MANAGEMENT NOTE 12/05/2012  Patient:  Jeanette Espinoza,Jeanette Espinoza   Account Number:  [id number]  Date Initiated:  12/05/2012  Documentation initiated by:  Sherrin Daisy  Subjective/Objective Assessment:   dx Osteoarthritis right hip; total hip replacemnt     Action/Plan:   SNF rehab   Anticipated DC Date:  12/05/2012   Anticipated DC Plan:  SKILLED NURSING FACILITY  In-house referral  Clinical Social Worker      DC Planning Services  CM consult      Choice offered to / List presented to:             Status of service:  Completed, signed off Medicare Important Message given?  NO (If response is "NO", the following Medicare IM given date fields will be blank) Date Medicare IM given:   Date Additional Medicare IM given:    Discharge Disposition:  [facility]  Per UR Regulation:  Reviewed for med. necessity/level of care/duration of stay  If discussed at [place] of Stay Meetings, dates discussed:    Comments:

## 2012-12-05 NOTE — Progress Notes (Signed)
Clinical Social Work Department CLINICAL SOCIAL WORK PLACEMENT NOTE 12/05/2012  Patient:  Jeanette Espinoza,Jeanette Espinoza  Account Number:  [id number] Admit date:  12/02/2012  Clinical Social Worker:  Werner Lean, LCSW  Date/time:  12/02/2012 12:00 M  Clinical Social Work is seeking post-discharge placement for this patient at the following level of care:   SKILLED NURSING   (*CSW will update this form in Epic as items are completed)     Patient/family provided with [facility] Department of Clinical Social Work's list of facilities offering this level of care within the geographic area requested by the patient (or if unable, by the patient's family).    Patient/family informed of their freedom to choose among providers that offer the needed level of care, that participate in Medicare, Medicaid or managed care program needed by the patient, have an available bed and are willing to accept the patient.    Patient/family informed of [facility]' ownership interest in [facility], as well as of the fact that they are under no obligation to receive care at this facility.  PASARR submitted to EDS on  PASARR number received from [facility] on   FL2 transmitted to all facilities in geographic area requested by pt/family on  12/02/2012 FL2 transmitted to all facilities within larger geographic area on   Patient informed that his/her managed care company has contracts with or will negotiate with  certain facilities, including the following:     Patient/family informed of bed offers received:  12/02/2012 Patient chooses bed at [facility] Physician recommends and patient chooses bed at    Patient to be transferred to [facility] on  12/05/2012 Patient to be transferred to facility by SNF  The following physician request were entered in Epic:   Additional Comments:  Werner Lean LCSW [phone]

## 2012-12-05 NOTE — Progress Notes (Signed)
Physical Therapy Treatment Patient Details Name: Jeanette Espinoza MRN: [id number] DOB: 07/13/1925 Today's Date: 12/05/2012 Time: [id number] PT Time Calculation (min): 24 min  PT Assessment / Plan / Recommendation Comments on Treatment Session  Pt progressing well with mobility, reviewed posterior THA precautions (pt recalled 1 of 3). Ther ex performed, pt ambulated 150' with RW with supervision.  Ready to DC to SNF from PT standpoint.    Follow Up Recommendations  SNF     Does the patient have the potential to tolerate intense rehabilitation     Barriers to Discharge        Equipment Recommendations       Recommendations for Other Services    Frequency     Plan Discharge plan remains appropriate;Frequency remains appropriate    Precautions / Restrictions Precautions Precautions: Posterior Hip Precaution Comments: pt stated 1/3 precautions, reviewed precautions in detail Restrictions Other Position/Activity Restrictions: WBAT   Pertinent Vitals/Pain **6/10 R hip with walking 3/10 at rest Pain meds requested, ice applied to R hip*    Mobility  Transfers Transfers: Sit to Stand;Stand to Sit Sit to Stand: 5: Supervision;With armrests;From chair/3-in-1 Stand to Sit: 5: Supervision;To chair/3-in-1;With armrests Details for Transfer Assistance: reviewed THA precautions Ambulation/Gait Ambulation/Gait Assistance: 5: Supervision Ambulation Distance (Feet): 150 Feet Assistive device: Rolling walker Gait Pattern: Step-to pattern;Trunk flexed;Decreased stance time - right Gait velocity: decreased General Gait Details: VCs for flexed neck, good sequencing with RW, no LOB    Exercises Total Joint Exercises Ankle Circles/Pumps: AROM;Right;10 reps;Supine Quad Sets: AROM;Right;10 reps;Supine Heel Slides: AAROM;Right;10 reps;Supine Hip ABduction/ADduction: AAROM;Right;10 reps;Supine Long Arc Quad: AROM;Right;10 reps;Supine   PT Diagnosis:    PT Problem List:   PT Treatment  Interventions:     PT Goals Acute Rehab PT Goals Pt will go Supine/Side to Sit: with supervision Pt will go Sit to Stand: with supervision PT Goal: Sit to Stand - Progress: Met Pt will go Stand to Sit: with supervision PT Goal: Stand to Sit - Progress: Met Pt will Ambulate: with rolling walker;with supervision;51 - 150 feet PT Goal: Ambulate - Progress: Met Pt will Perform Home Exercise Program: with supervision, verbal cues required/provided PT Goal: Perform Home Exercise Program - Progress: Progressing toward goal Additional Goals Additional Goal #1: state/demo 3/3 posterior precautions  PT Goal: Additional Goal #1 - Progress: Progressing toward goal  Visit Information  Last PT Received On: 12/05/12 Assistance Needed: +1    Subjective Data  Subjective: I don't like to be in the bed too long.  Patient Stated Goal: return to gardening and driving   Cognition  Overall Cognitive Status: Appears within functional limits for tasks assessed/performed Arousal/Alertness: Awake/alert Orientation Level: Appears intact for tasks assessed Behavior During Session: [facility] for tasks performed    Balance     End of Session PT - End of Session Equipment Utilized During Treatment: Gait belt Activity Tolerance: Patient tolerated treatment well Patient left: with call bell/phone within reach;in chair   GP     Philomena Doheny 12/05/2012, 8:52 AM

## 2012-12-05 NOTE — Progress Notes (Signed)
   Subjective: 3 Days Post-Op Procedure(s) (LRB): TOTAL HIP ARTHROPLASTY (Right) Patient reports pain as mild and moderate.  Better this morning. Patient seen in rounds for Dr. Wynelle Link. Patient is well, and has had no acute complaints or problems Patient is ready to go [facility] for Rehab.  Objective: Vital signs in last 24 hours: Temp:  [98 F (36.7 C)-98.3 F (36.8 C)] 98.3 F (36.8 C) (01/27 0500) Pulse Rate:  [64-77] 73  (01/27 0500) Resp:  [18] 18  (01/27 0500) BP: (112-146)/(51-80) 131/51 mmHg (01/27 0500) SpO2:  [94 %-98 %] 94 % (01/27 0500)  Intake/Output from previous day:  Intake/Output Summary (Last 24 hours) at 12/05/12 0635 Last data filed at 12/05/12 0401  Gross per 24 hour  Intake    480 ml  Output    400 ml  Net     80 ml    Intake/Output this shift: Total I/O In: 240 [P.O.:240] Out: -   Labs:  Basename 12/05/12 0500 12/04/12 0417 12/03/12 0513  HGB 10.3* 11.0* 8.1*    Basename 12/05/12 0500 12/04/12 0417  WBC 9.0 9.8  RBC 3.35* 3.58*  HCT 29.3* 31.1*  PLT 167 159    Basename 12/04/12 0417 12/03/12 0513  NA 129* 130*  K 3.8 4.2  CL 98 99  CO2 20 23  BUN 22 21  CREATININE 1.29* 1.28*  GLUCOSE 153* 119*  CALCIUM 8.0* 7.9*   No results found for this basename: LABPT:2,INR:2 in the last 72 hours  EXAM: General - Patient is Alert, Appropriate and Oriented Extremity - Neurologically intact Neurovascular intact Sensation intact distally Dorsiflexion/Plantar flexion intact No cellulitis present Incision - clean, dry, no drainage, healing Motor Function - intact, moving foot and toes well on exam.   Assessment/Plan: 3 Days Post-Op Procedure(s) (LRB): TOTAL HIP ARTHROPLASTY (Right) Procedure(s) (LRB): TOTAL HIP ARTHROPLASTY (Right) Past Medical History  Diagnosis Date  . HTN (hypertension)     echo (11/11) with EF 55-60%, mild LV hypertrophy, PA systolic pressure 34 mmHg  . DM2 (diabetes mellitus, type 2)     diet controlled  .  HLD (hyperlipidemia)   . Low back pain   . Arthritis    Principal Problem:  *OA (osteoarthritis) of hip Active Problems:  Acute blood loss anemia  Postop Hyponatremia  Postop Transfusion  Estimated Body mass index is 25.45 kg/(m^2) as calculated from the following:   Height as of this encounter: 4\' 11" (1.499 m).   Weight as of this encounter: 126 lb(57.153 kg). Up with therapy Discharge to SNF Diet - Cardiac diet and Diabetic diet Follow up - in 2 weeks Activity - WBAT Disposition - Skilled nursing facility Condition Upon Discharge - Good D/C Meds - See DC Summary DVT Prophylaxis - Lovenox for 7 more days, then ASA 325 mg daily for four weeks, then resume Baby 81 mg ASA  Mazikeen Hehn 12/05/2012, 6:35 AM

## 2012-12-07 MED FILL — Bupivacaine HCl Preservative Free (PF) Inj 0.5%: INTRAMUSCULAR | Qty: 30 | Status: AC

## 2013-10-10 ENCOUNTER — Encounter: Payer: Self-pay | Admitting: Internal Medicine

## 2013-10-10 DIAGNOSIS — I10 Essential (primary) hypertension: Secondary | ICD-10-CM

## 2013-10-10 DIAGNOSIS — R7303 Prediabetes: Secondary | ICD-10-CM

## 2013-10-10 DIAGNOSIS — E785 Hyperlipidemia, unspecified: Secondary | ICD-10-CM

## 2013-10-10 DIAGNOSIS — E559 Vitamin D deficiency, unspecified: Secondary | ICD-10-CM

## 2013-10-11 ENCOUNTER — Ambulatory Visit (INDEPENDENT_AMBULATORY_CARE_PROVIDER_SITE_OTHER): Payer: Medicare Other | Admitting: Emergency Medicine

## 2013-10-11 ENCOUNTER — Encounter: Payer: Self-pay | Admitting: Emergency Medicine

## 2013-10-11 VITALS — BP 138/60 | HR 70 | Temp 98.4°F | Resp 16 | Ht 59.0 in | Wt 130.0 lb

## 2013-10-11 DIAGNOSIS — R5381 Other malaise: Secondary | ICD-10-CM

## 2013-10-11 DIAGNOSIS — I10 Essential (primary) hypertension: Secondary | ICD-10-CM

## 2013-10-11 DIAGNOSIS — R7309 Other abnormal glucose: Secondary | ICD-10-CM

## 2013-10-11 DIAGNOSIS — Z23 Encounter for immunization: Secondary | ICD-10-CM

## 2013-10-11 DIAGNOSIS — Z0000 Encounter for general adult medical examination without abnormal findings: Secondary | ICD-10-CM

## 2013-10-11 DIAGNOSIS — E782 Mixed hyperlipidemia: Secondary | ICD-10-CM

## 2013-10-11 DIAGNOSIS — Z1212 Encounter for screening for malignant neoplasm of rectum: Secondary | ICD-10-CM

## 2013-10-11 LAB — CBC WITH DIFFERENTIAL/PLATELET
Basophils Relative: 0 % (ref 0–1)
Eosinophils Absolute: 0.1 10*3/uL (ref 0.0–0.7)
Eosinophils Relative: 1 % (ref 0–5)
MCH: 32.2 pg (ref 26.0–34.0)
MCHC: 34.4 g/dL (ref 30.0–36.0)
Neutrophils Relative %: 59 % (ref 43–77)
Platelets: 233 10*3/uL (ref 150–400)

## 2013-10-11 LAB — LIPID PANEL
HDL: 47 mg/dL (ref >39)
Total CHOL/HDL Ratio: 5.7 Ratio

## 2013-10-11 LAB — HEPATIC FUNCTION PANEL
ALT: 10 U/L (ref 0–35)
AST: 15 U/L (ref 0–37)
Albumin: 4.6 g/dL (ref 3.5–5.2)
Alkaline Phosphatase: 59 U/L (ref 39–117)
Bilirubin, Direct: <0.1 mg/dL (ref 0.0–0.3)
Total Bilirubin: 0.4 mg/dL (ref 0.3–1.2)
Total Protein: 7.3 g/dL (ref 6.0–8.3)

## 2013-10-11 LAB — BASIC METABOLIC PANEL WITH GFR
Calcium: 9.7 mg/dL (ref 8.4–10.5)
GFR, Est African American: 29 mL/min — ABNORMAL LOW
GFR, Est Non African American: 25 mL/min — ABNORMAL LOW
Sodium: 137 mEq/L (ref 135–145)

## 2013-10-11 NOTE — Patient Instructions (Signed)

## 2013-10-12 LAB — MICROALBUMIN / CREATININE URINE RATIO: Microalb Creat Ratio: 21.1 mg/g (ref 0.0–30.0)

## 2013-10-12 LAB — URINALYSIS, ROUTINE W REFLEX MICROSCOPIC
Bilirubin Urine: NEGATIVE
Hgb urine dipstick: NEGATIVE
Ketones, ur: NEGATIVE mg/dL
Nitrite: NEGATIVE
Urobilinogen, UA: 0.2 mg/dL (ref 0.0–1.0)
pH: 5.5 (ref 5.0–8.0)

## 2013-10-12 LAB — INSULIN, FASTING: Insulin fasting, serum: 18 u[IU]/mL (ref 3–28)

## 2013-10-12 LAB — TSH: TSH: 1.58 u[IU]/mL (ref 0.350–4.500)

## 2013-10-12 LAB — HEMOGLOBIN A1C: Hgb A1c MFr Bld: 6.6 % — ABNORMAL HIGH (ref <5.7)

## 2013-10-12 NOTE — Progress Notes (Signed)
Subjective:    Patient ID: Jeanette Espinoza, female    DOB: 1925-05-19, 79 y.o.   MRN: [id number]  HPI Comments: 79 yo WF CPE and presents for 3 month F/U for HTN, Cholesterol, Pre-Dm, D. Deficient. She is overall doing well. She has been eating healthy and slowly increasing exercise. She restarted cholesterol RX AD x 3 months then stopped because she felt she did not need any more. She did no have any SE while on prescription.  She had right hip replaced last year and is know experiencing right knee pain. She has not injured either joint and has follow up with  Dr Nelva Bush pending. She has started using a walker to give her more stability with the knee pain and weakness.  She notes she has occasional fatigue. She notes it is worse when pain level is up with knee.   Hypertension  Hyperlipidemia    Current Outpatient Prescriptions on File Prior to Visit  Medication Sig Dispense Refill  . atenolol (TENORMIN) 50 MG tablet Take 50 mg by mouth daily. 1/2      . Cholecalciferol (VITAMIN D) 2000 UNITS CAPS Take 6,000 Units by mouth daily.      . enalapril (VASOTEC) 20 MG tablet Take 20 mg by mouth at bedtime.       No current facility-administered medications on file prior to visit.    Review of patient's allergies indicates no known allergies.  Past Surgical History  Procedure Laterality Date  . Lexiscan myoview  11/11    EF 81%, normal perfusion images suggesting no ischemia or infarction  . Surgery for ddd    . Cataract surgery    . Abnormal ekg    . Back surgery    . Total hip arthroplasty  12/02/2012    Procedure: TOTAL HIP ARTHROPLASTY;  Surgeon: Gearlean Alf, MD;  Location: [facility];  Service: Orthopedics;  Laterality: Right;  . Eye surgery Bilateral     cataract  . Spine surgery      lumbar  . Abdominal hysterectomy      partial  . Joint replacement      left hip replacement , right knee replacement     History   Social History  . Marital Status: Single    Spouse  Name: N/A    Number of Children: N/A  . Years of Education: N/A   Social History Main Topics  . Smoking status: Former Smoker    Quit date: 11/09/1970  . Smokeless tobacco: None  . Alcohol Use: Yes     Comment: occasional   . Drug Use: No  . Sexual Activity: None   Other Topics Concern  . None   Social History Narrative   Retired, lives in [place]. Widowed   Daughter in [place]. [name] [phone])   Does not get regular exercise   Family History  Problem Relation Age of Onset  . Diabetes      DM - sibiling   . Coronary artery disease Neg Hx     no premature CAd   . Heart disease Mother      Review of Systems  Constitutional: Positive for fatigue.  Eyes:       DR. Miller 2014 WNL  Cardiovascular:       Dr Aundra Dubin PRN  Gastrointestinal:       Dr. Amedeo Plenty PRN  Musculoskeletal: Positive for arthralgias and gait problem.       Dr Claretta Fraise- F/U pending    BP 138/60  Pulse 70  Temp(Src) 98.4 F (36.9 C) (Temporal)  Resp 16  Ht 4\' 11"  (1.499 m)  Wt 130 lb (58.968 kg)  BMI 26.24 kg/m2     Objective:   Physical Exam  Nursing note and vitals reviewed. Constitutional: She is oriented to person, place, and time. She appears well-developed and well-nourished. No distress.  HENT:  Head: Normocephalic and atraumatic.  Right Ear: External ear normal.  Left Ear: External ear normal.  Nose: Nose normal.  Mouth/Throat: Oropharynx is clear and moist.  Eyes: Conjunctivae and EOM are normal. Pupils are equal, round, and reactive to light. No scleral icterus.  Neck: Normal range of motion. Neck supple. No JVD present. No thyromegaly present.  Cardiovascular: Normal rate, regular rhythm, normal heart sounds and intact distal pulses.   Pulmonary/Chest: Effort normal and breath sounds normal.  Abdominal: Soft. Bowel sounds are normal. She exhibits no distension and no mass. There is no tenderness. There is no rebound and no guarding.  Genitourinary:  GYN-Refuses PAP/ Manual  exam Breasts WNL  Musculoskeletal: Normal range of motion. She exhibits no edema and no tenderness.  + crepitus R>L knee. With walker + hammer toes Bilateral no change  Lymphadenopathy:    She has no cervical adenopathy.  Neurological: She is alert and oriented to person, place, and time. She has normal reflexes. No cranial nerve deficit. She exhibits normal muscle tone. Coordination normal.  Decreased sensory right lateral thigh  Skin: Skin is warm and dry. No rash noted. No erythema. No pallor.  Psychiatric: She has a normal mood and affect. Her behavior is normal. Judgment and thought content normal.         EKG NSCSPT Assessment & Plan:  1. CPE and for 3 month F/U for HTN, Cholesterol, Pre-Dm, D. Deficient. Check labs, continue to improve diet and exercise. 2. Right knee pain- keep follow with Dr. Nelva Bush try to do chair exercises or water classes. 3. Fatigue- check labs

## 2013-11-15 NOTE — Progress Notes (Signed)
appt scheduled 01/17/14

## 2013-12-08 ENCOUNTER — Other Ambulatory Visit: Payer: Self-pay | Admitting: Internal Medicine

## 2014-01-17 ENCOUNTER — Ambulatory Visit: Payer: Self-pay | Admitting: Internal Medicine

## 2014-02-06 ENCOUNTER — Ambulatory Visit (INDEPENDENT_AMBULATORY_CARE_PROVIDER_SITE_OTHER): Payer: Medicare Other | Admitting: Internal Medicine

## 2014-02-06 ENCOUNTER — Encounter: Payer: Self-pay | Admitting: Internal Medicine

## 2014-02-06 VITALS — BP 134/76 | HR 76 | Temp 97.6°F | Resp 16 | Ht 58.5 in | Wt 128.6 lb

## 2014-02-06 DIAGNOSIS — E559 Vitamin D deficiency, unspecified: Secondary | ICD-10-CM

## 2014-02-06 DIAGNOSIS — E119 Type 2 diabetes mellitus without complications: Secondary | ICD-10-CM | POA: Insufficient documentation

## 2014-02-06 DIAGNOSIS — I10 Essential (primary) hypertension: Secondary | ICD-10-CM

## 2014-02-06 DIAGNOSIS — E1129 Type 2 diabetes mellitus with other diabetic kidney complication: Secondary | ICD-10-CM

## 2014-02-06 DIAGNOSIS — R7309 Other abnormal glucose: Secondary | ICD-10-CM

## 2014-02-06 DIAGNOSIS — Z79899 Other long term (current) drug therapy: Secondary | ICD-10-CM | POA: Insufficient documentation

## 2014-02-06 DIAGNOSIS — R7303 Prediabetes: Secondary | ICD-10-CM

## 2014-02-06 DIAGNOSIS — E782 Mixed hyperlipidemia: Secondary | ICD-10-CM

## 2014-02-06 LAB — CBC WITH DIFFERENTIAL/PLATELET
Basophils Absolute: 0 10*3/uL (ref 0.0–0.1)
Basophils Relative: 0 % (ref 0–1)
Eosinophils Absolute: 0.1 10*3/uL (ref 0.0–0.7)
Eosinophils Relative: 2 % (ref 0–5)
HCT: 35.3 % — ABNORMAL LOW (ref 36.0–46.0)
HEMOGLOBIN: 12.2 g/dL (ref 12.0–15.0)
LYMPHS ABS: 2.6 10*3/uL (ref 0.7–4.0)
LYMPHS PCT: 36 % (ref 12–46)
MCH: 31.3 pg (ref 26.0–34.0)
MCHC: 34.6 g/dL (ref 30.0–36.0)
MCV: 90.5 fL (ref 78.0–100.0)
MONOS PCT: 7 % (ref 3–12)
Monocytes Absolute: 0.5 10*3/uL (ref 0.1–1.0)
NEUTROS PCT: 55 % (ref 43–77)
Neutro Abs: 4 10*3/uL (ref 1.7–7.7)
Platelets: 258 10*3/uL (ref 150–400)
RBC: 3.9 MIL/uL (ref 3.87–5.11)
RDW: 13.5 % (ref 11.5–15.5)
WBC: 7.2 10*3/uL (ref 4.0–10.5)

## 2014-02-06 LAB — HEMOGLOBIN A1C
HEMOGLOBIN A1C: 6.4 % — AB (ref <5.7)
Mean Plasma Glucose: 137 mg/dL — ABNORMAL HIGH (ref <117)

## 2014-02-06 NOTE — Progress Notes (Signed)
Patient ID: Jeanette Espinoza, female   DOB: [birth year], 80 y.o.   MRN: [id number]    This very nice and spry 80 y.o. WWF presents for 3 month follow up with Hypertension, Hyperlipidemia, Pre-Diabetes and Vitamin D Deficiency.    HTN predates many years and has been controlled at regular office F/U. Today's BP: 134/76 mmHg.  Patient denies any cardiac type chest pain, palpitations, dyspnea/orthopnea/PND, dizziness, claudication, or dependent edema.   Hyperlipidemia is controlled with diet  With  last Cholesterol  269, Triglycerides were 493, HDL 47 and LDL wasn't calculated and patient was started on Atorvastatin.  Patient denies myalgias or other med SE's.    Also, the patient has history of T2 NIDDM controlled with diet and  predating since 2006 with last A1c of  6.6% in Dec 2014. Last labs calculated GFR 30 ml/min consistent with Stage III/IV CKD. Patient denies any symptoms of reactive hypoglycemia, diabetic polys, paresthesias or visual blurring.   Further, Patient has history of Vitamin D Deficiency with last vitamin D of 82 in Aug 2014.  Patient supplements vitamin D without any suspected side-effects.  Medication Sig  . aspirin 81 MG tablet Take 81 mg by mouth daily.  Marland Kitchen atenolol (TENORMIN) 50 MG tablet Take 50 mg by mouth daily. 1/2  . atorvastatin (LIPITOR) 80 MG tablet Take 80 mg by mouth daily. 1/2  . Cholecalciferol (VITAMIN D) 2000 UNITS CAPS Take 6,000 Units by mouth daily.  . enalapril (VASOTEC) 20 MG tablet Take one tablet by mouth at bedtime  for blood pressure  . Magnesium 250 MG TABS Take by mouth 2 (two) times daily.   No Known Allergies  PMHx:   Past Medical History  Diagnosis Date  . HTN (hypertension)     echo (11/11) with EF 55-60%, mild LV hypertrophy, PA systolic pressure 34 mmHg  . Pre-diabetes     diet controlled  . HLD (hyperlipidemia)   . Low back pain   . Arthritis   . Unspecified vitamin D deficiency    FHx:    Reviewed / unchanged  SHx:    Reviewed /  unchanged   Systems Review: Constitutional: Denies fever, chills, wt changes, headaches, insomnia, fatigue, night sweats, change in appetite. Eyes: Denies redness, blurred vision, diplopia, discharge, itchy, watery eyes.  ENT: Denies discharge, congestion, post nasal drip, epistaxis, sore throat, earache, hearing loss, dental pain, tinnitus, vertigo, sinus pain, snoring.  CV: Denies chest pain, palpitations, irregular heartbeat, syncope, dyspnea, diaphoresis, orthopnea, PND, claudication, edema. Respiratory: denies cough, dyspnea, DOE, pleurisy, hoarseness, laryngitis, wheezing.  Gastrointestinal: Denies dysphagia, odynophagia, heartburn, reflux, water brash, abdominal pain or cramps, nausea, vomiting, bloating, diarrhea, constipation, hematemesis, melena, hematochezia,  or hemorrhoids. Genitourinary: Denies dysuria, frequency, urgency, nocturia, hesitancy, discharge, hematuria, flank pain. Musculoskeletal: Denies arthralgias, myalgias, stiffness, jt. swelling, pain, limp, strain/sprain.  Skin: Denies pruritus, rash, hives, warts, acne, eczema, change in skin lesion(s). Neuro: No weakness, tremor, incoordination, spasms, paresthesia, or pain. Psychiatric: Denies confusion, memory loss, or sensory loss. Endo: Denies change in weight, skin, hair change.  Heme/Lymph: No excessive bleeding, bruising, orenlarged lymph nodes.   Exam:   BP 134/76  Pulse 76  Temp 97.6 F   Resp 16  Ht 4' 10.5"   Wt 128 lb 9.6 oz   BMI 26.42 kg/m2  Appears well nourished - in no distress. Eyes: PERRLA, EOMs, conjunctiva no swelling or erythema. Sinuses: No frontal/maxillary tenderness ENT/Mouth: EAC's clear, TM's nl w/o erythema, bulging. Nares clear w/o erythema, swelling, exudates. Oropharynx clear  without erythema or exudates. Oral hygiene is good. Tongue normal, non obstructing. Hearing intact.  Neck: Supple. Thyroid nl. Car 2+/2+ without bruits, nodes or JVD. Chest: Respirations nl with BS clear & equal  w/o rales, rhonchi, wheezing or stridor.  Cor: Heart sounds normal w/ regular rate and rhythm without sig. murmurs, gallops, clicks, or rubs. Peripheral pulses normal and equal  without edema.  Abdomen: Soft & bowel sounds normal. Non-tender w/o guarding, rebound, hernias, masses, or organomegaly.  Lymphatics: Unremarkable.  Musculoskeletal: Full ROM all peripheral extremities, joint stability, 5/5 strength, and normal gait.  Skin: Warm, dry without exposed rashes, lesions, ecchymosis apparent.  Neuro: Cranial nerves intact, reflexes equal bilaterally. Sensory-motor testing grossly intact. Tendon reflexes grossly intact.  Pysch: Alert & oriented x 3. Insight and judgement nl & appropriate. No ideations.  Assessment and Plan:  1. Hypertension - Continue monitor blood pressure at home. Continue diet/meds same.  2. Hyperlipidemia - Continue diet/meds, exercise,& lifestyle modifications. Continue monitor periodic cholesterol/liver & renal functions   3. T2 NIDDM w/ Stage III CKD - continue recommend prudent low glycemic diet, weight control, regular exercise, diabetic monitoring and periodic eye exams.  4. Vitamin D Deficiency - Continue supplementation.  Recommended regular exercise, BP monitoring, weight control, and discussed med and SE's. Recommended labs to assess and monitor clinical status. Further disposition pending results of labs.

## 2014-02-07 LAB — MAGNESIUM: Magnesium: 2.4 mg/dL (ref 1.5–2.5)

## 2014-02-07 LAB — LIPID PANEL
Cholesterol: 244 mg/dL — ABNORMAL HIGH (ref 0–200)
HDL: 50 mg/dL (ref >39)
LDL CALC: 141 mg/dL — AB (ref 0–99)
Total CHOL/HDL Ratio: 4.9 Ratio
Triglycerides: 267 mg/dL — ABNORMAL HIGH (ref <150)
VLDL: 53 mg/dL — AB (ref 0–40)

## 2014-02-07 LAB — HEPATIC FUNCTION PANEL
ALK PHOS: 55 U/L (ref 39–117)
ALT: 9 U/L (ref 0–35)
AST: 14 U/L (ref 0–37)
Albumin: 4.3 g/dL (ref 3.5–5.2)
BILIRUBIN DIRECT: 0.1 mg/dL (ref 0.0–0.3)
BILIRUBIN INDIRECT: 0.3 mg/dL (ref 0.2–1.2)
Total Bilirubin: 0.4 mg/dL (ref 0.2–1.2)
Total Protein: 7 g/dL (ref 6.0–8.3)

## 2014-02-07 LAB — BASIC METABOLIC PANEL WITH GFR
BUN: 26 mg/dL — ABNORMAL HIGH (ref 6–23)
CALCIUM: 9.5 mg/dL (ref 8.4–10.5)
CHLORIDE: 100 meq/L (ref 96–112)
CO2: 24 meq/L (ref 19–32)
Creat: 1.63 mg/dL — ABNORMAL HIGH (ref 0.50–1.10)
GFR, Est African American: 33 mL/min — ABNORMAL LOW
GFR, Est Non African American: 28 mL/min — ABNORMAL LOW
GLUCOSE: 111 mg/dL — AB (ref 70–99)
POTASSIUM: 4.6 meq/L (ref 3.5–5.3)
SODIUM: 135 meq/L (ref 135–145)

## 2014-02-07 LAB — VITAMIN D 25 HYDROXY (VIT D DEFICIENCY, FRACTURES): Vit D, 25-Hydroxy: 78 ng/mL (ref 30–89)

## 2014-02-07 LAB — TSH: TSH: 1.475 u[IU]/mL (ref 0.350–4.500)

## 2014-02-07 LAB — INSULIN, FASTING: Insulin fasting, serum: 14 u[IU]/mL (ref 3–28)

## 2014-05-09 ENCOUNTER — Ambulatory Visit (INDEPENDENT_AMBULATORY_CARE_PROVIDER_SITE_OTHER): Payer: Medicare Other | Admitting: Physician Assistant

## 2014-05-09 ENCOUNTER — Encounter: Payer: Self-pay | Admitting: Physician Assistant

## 2014-05-09 VITALS — BP 132/72 | HR 64 | Temp 97.9°F | Resp 16 | Wt 129.0 lb

## 2014-05-09 DIAGNOSIS — E559 Vitamin D deficiency, unspecified: Secondary | ICD-10-CM

## 2014-05-09 DIAGNOSIS — Z0000 Encounter for general adult medical examination without abnormal findings: Secondary | ICD-10-CM

## 2014-05-09 DIAGNOSIS — E1129 Type 2 diabetes mellitus with other diabetic kidney complication: Secondary | ICD-10-CM

## 2014-05-09 DIAGNOSIS — E782 Mixed hyperlipidemia: Secondary | ICD-10-CM

## 2014-05-09 DIAGNOSIS — Z9181 History of falling: Secondary | ICD-10-CM

## 2014-05-09 DIAGNOSIS — Z79899 Other long term (current) drug therapy: Secondary | ICD-10-CM

## 2014-05-09 DIAGNOSIS — Z1331 Encounter for screening for depression: Secondary | ICD-10-CM

## 2014-05-09 DIAGNOSIS — I10 Essential (primary) hypertension: Secondary | ICD-10-CM

## 2014-05-09 LAB — HEPATIC FUNCTION PANEL
ALBUMIN: 4.5 g/dL (ref 3.5–5.2)
ALK PHOS: 57 U/L (ref 39–117)
ALT: 11 U/L (ref 0–35)
AST: 16 U/L (ref 0–37)
BILIRUBIN DIRECT: 0.1 mg/dL (ref 0.0–0.3)
BILIRUBIN TOTAL: 0.5 mg/dL (ref 0.2–1.2)
Indirect Bilirubin: 0.4 mg/dL (ref 0.2–1.2)
Total Protein: 7.4 g/dL (ref 6.0–8.3)

## 2014-05-09 LAB — CBC WITH DIFFERENTIAL/PLATELET
BASOS ABS: 0 10*3/uL (ref 0.0–0.1)
Basophils Relative: 0 % (ref 0–1)
EOS PCT: 2 % (ref 0–5)
Eosinophils Absolute: 0.2 10*3/uL (ref 0.0–0.7)
HCT: 36.1 % (ref 36.0–46.0)
Hemoglobin: 12.4 g/dL (ref 12.0–15.0)
LYMPHS PCT: 31 % (ref 12–46)
Lymphs Abs: 2.4 10*3/uL (ref 0.7–4.0)
MCH: 31.4 pg (ref 26.0–34.0)
MCHC: 34.3 g/dL (ref 30.0–36.0)
MCV: 91.4 fL (ref 78.0–100.0)
Monocytes Absolute: 0.5 10*3/uL (ref 0.1–1.0)
Monocytes Relative: 7 % (ref 3–12)
Neutro Abs: 4.6 10*3/uL (ref 1.7–7.7)
Neutrophils Relative %: 60 % (ref 43–77)
PLATELETS: 231 10*3/uL (ref 150–400)
RBC: 3.95 MIL/uL (ref 3.87–5.11)
RDW: 13.2 % (ref 11.5–15.5)
WBC: 7.6 10*3/uL (ref 4.0–10.5)

## 2014-05-09 LAB — HEMOGLOBIN A1C
Hgb A1c MFr Bld: 6.6 % — ABNORMAL HIGH (ref <5.7)
Mean Plasma Glucose: 143 mg/dL — ABNORMAL HIGH (ref <117)

## 2014-05-09 LAB — BASIC METABOLIC PANEL WITH GFR
BUN: 23 mg/dL (ref 6–23)
CHLORIDE: 98 meq/L (ref 96–112)
CO2: 25 meq/L (ref 19–32)
CREATININE: 1.53 mg/dL — AB (ref 0.50–1.10)
Calcium: 9.6 mg/dL (ref 8.4–10.5)
GFR, EST NON AFRICAN AMERICAN: 31 mL/min — AB
GFR, Est African American: 35 mL/min — ABNORMAL LOW
Glucose, Bld: 109 mg/dL — ABNORMAL HIGH (ref 70–99)
Potassium: 4.7 mEq/L (ref 3.5–5.3)
SODIUM: 132 meq/L — AB (ref 135–145)

## 2014-05-09 LAB — TSH: TSH: 1.771 u[IU]/mL (ref 0.350–4.500)

## 2014-05-09 LAB — LIPID PANEL
CHOL/HDL RATIO: 3.3 ratio
Cholesterol: 180 mg/dL (ref 0–200)
HDL: 55 mg/dL (ref >39)
LDL Cholesterol: 87 mg/dL (ref 0–99)
Triglycerides: 191 mg/dL — ABNORMAL HIGH (ref <150)
VLDL: 38 mg/dL (ref 0–40)

## 2014-05-09 LAB — MAGNESIUM: Magnesium: 2.2 mg/dL (ref 1.5–2.5)

## 2014-05-09 NOTE — Progress Notes (Signed)
MEDICARE ANNUAL WELLNESS VISIT AND FOLLOW UP  Assessment:   1. Essential hypertension - CBC with Differential - BASIC METABOLIC PANEL WITH GFR - Hepatic function panel - TSH  2. 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 - Hemoglobin A1c  3. Encounter for long-term (current) use of other medications - Magnesium  4. Hyperlipidemia - Lipid panel  5. Vitamin D Deficiency  6. Cognition very good for age She would like to come every 6 months, she is at [facility], stable on medications.    Plan:   During the course of the visit the patient was educated and counseled about appropriate screening and preventive services including:    Pneumococcal vaccine   Influenza vaccine  Td vaccine  Screening electrocardiogram  Screening mammography  Bone densitometry screening  Colorectal cancer screening  Diabetes screening  Glaucoma screening  Nutrition counseling   Advanced directives: given info/requested  Screening recommendations, referrals:  Vaccinations: Tdap vaccine not indicated Influenza vaccine not indicated Pneumococcal vaccine not indicated Shingles vaccine declined Hep B vaccine not indicated  Nutrition assessed and recommended  Colonoscopy declined Mammogram declined Pap smear not indicated Pelvic exam not indicated Recommended yearly ophthalmology/optometry visit for glaucoma screening and checkup Recommended yearly dental visit for hygiene and checkup Advanced directives - in chart  Conditions/risks identified: BMI: Discussed weight loss, diet, and increase physical activity.  Increase physical activity: AHA recommends 150 minutes of physical activity a week.  Medications reviewed DEXA- declined Diabetes is at goal,  ACE/ARB therapy: Yes. Urinary Incontinence is not an issue: discussed non pharmacology and pharmacology options.  Fall risk: moderate-high due to impaired mobility, long discussion about fall risk and if she wants PT to call office- discussed PT, home fall assessment, medications.    Subjective:   Jeanette Espinoza is a 80 y.o. female who presents for Medicare Annual Wellness Visit and 3 month follow up on hypertension, diabetes, hyperlipidemia, vitamin D def.  Date of last medicare wellness visit is unknown.   Her blood pressure has been controlled at home, today their BP is BP: 132/72 mmHg She does not workout due to the hot weather and hip pain but she will garden and walk some, she walks with walker. She denies chest pain, shortness of breath, dizziness.  She is on cholesterol medication and denies myalgias. Her cholesterol is not at goal. The cholesterol last visit was:   Lab Results  Component Value Date   CHOL 244* 02/06/2014   HDL 50 02/06/2014   LDLCALC 141* 02/06/2014   TRIG 267* 02/06/2014   CHOLHDL 4.9 02/06/2014   She has been working on diet and exercise for diabetes, she does not check her sugars at home, and denies paresthesia of the feet, polydipsia and polyuria. Last A1C in the office was:  Lab Results  Component Value Date   HGBA1C 6.4* 02/06/2014   Patient is on Vitamin D supplement. Lab Results  Component Value Date   VD25OH 19 02/06/2014    Lives at [facility]. Denies falls in the past year.   Names of Other Physician/Practitioners you currently use: 1. [facility]- here for primary care 2. Dr. Sabra Heck, eye doctor, last visit 12/2013 3. Dr. Lavonna Rua, dentist, last visit 6 months ago and she is due Patient Care Team: Unk Pinto, MD as PCP - General (Internal Medicine) Gaynelle Arabian  Clayton Bibles, MD as Consulting Physician (Orthopedic Surgery) Harrie Foreman, OD as Referring Physician (Optometry) Missy Sabins, MD as Consulting Physician  (Gastroenterology) Larey Dresser, MD as Consulting Physician (Cardiology) Cindee Salt, MD as Consulting Physician (Physical Medicine and Rehabilitation)  Medication Review Current Outpatient Prescriptions on File Prior to Visit  Medication Sig Dispense Refill  . aspirin 81 MG tablet Take 81 mg by mouth daily.      Marland Kitchen atenolol (TENORMIN) 50 MG tablet Take 50 mg by mouth daily.       Marland Kitchen atorvastatin (LIPITOR) 80 MG tablet Take 80 mg by mouth daily.       . Cholecalciferol (VITAMIN D) 2000 UNITS CAPS Take 6,000 Units by mouth daily.      . enalapril (VASOTEC) 20 MG tablet Take one tablet by mouth at bedtime  for blood pressure  90 tablet  1  . Magnesium 250 MG TABS Take by mouth 2 (two) times daily.       No current facility-administered medications on file prior to visit.    Current Problems (verified) Patient Active Problem List   Diagnosis Date Noted  . Encounter for long-term (current) use of other medications 02/06/2014  . 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 HTN (hypertension)   . Hyperlipidemia   . Vitamin D Deficiency   . OA (osteoarthritis) of hip 12/02/2012    Screening Tests Health Maintenance  Topic Date Due  . Zostavax  06/04/1987  . Influenza Vaccine  06/09/2014  . Colonoscopy  11/09/2016  . Tetanus/tdap  09/21/2022  . Pneumococcal Polysaccharide Vaccine Age 13 And Over  Completed     Immunization History  Administered Date(s) Administered  . Influenza Split 10/11/2013  . Influenza-Unspecified 10/10/2012  . Pneumococcal Polysaccharide-23 10/11/2013  . Tdap 09/21/2012    Preventative care: Last colonoscopy: 2008 Last mammogram: 2011 Last pap smear/pelvic exam: 2010   DEXA: declines Echo 2011 EF 55-60% mild LVH NM Myoview 2011 normal  Prior vaccinations: TD or Tdap: 2013  Influenza: 2014 Pneumococcal: 2014 Shingles/Zostavax: declines  History reviewed: allergies, current medications, past family history, past medical  history, past social history, past surgical history and problem list  Risk Factors: Osteoporosis: postmenopausal estrogen deficiency and dietary calcium and/or vitamin D deficiency History of fracture in the past year: no  Tobacco History  Substance Use Topics  . Smoking status: Former Smoker    Quit date: 11/09/1970  . Smokeless tobacco: Not on file  . Alcohol Use: Yes     Comment: occasional    She does not smoke.  Patient is a former smoker. Are there smokers in your home (other than you)?  No  Alcohol Current alcohol use: social drinker  Caffeine Current caffeine use: coffee 2 /day  Exercise Current exercise: gardening  Nutrition/Diet Current diet: in general, a "healthy" diet    Cardiac risk factors: advanced age (older than 78 for men, 66 for women), dyslipidemia, hypertension and sedentary lifestyle.  Depression Screen (Note: if answer to either of the following is "Yes", a more complete depression screening is indicated)   Q1: Over the past two weeks, have you felt down, depressed or hopeless? No  Q2: Over the past two weeks, have you felt little interest or pleasure in doing things? No  Have you lost interest or pleasure in daily life? No  Do you often feel hopeless? No  Do you cry easily over simple problems? No  Activities of Daily Living In your present state of health,  do you have any difficulty performing the following activities?:  Driving? Yes Managing money?  No Feeding yourself? No Getting from bed to chair? No Climbing a flight of stairs? Yes Preparing food and eating?: No Bathing or showering? No Getting dressed: No Getting to the toilet? No Using the toilet:No Moving around from place to place: Yes In the past year have you fallen or had a near fall?:No   Are you sexually active?  No  Do you have more than one partner?  No  Vision Difficulties: No  Hearing Difficulties: Yes Do you often ask people to speak up or repeat themselves?  No Do you experience ringing or noises in your ears? No Do you have difficulty understanding soft or whispered voices? Yes  Cognition  Do you feel that you have a problem with memory?No  Do you often misplace items? No  Do you feel safe at home?  Yes  Advanced directives Does patient have a [place]? Yes Does patient have a Living Will? Yes   Objective:   Blood pressure 132/72, pulse 64, temperature 97.9 F (36.6 C), resp. rate 16, weight 129 lb (58.514 kg). Body mass index is 26.5 kg/(m^2).  General appearance: alert, no distress, WD/WN,  female Cognitive Testing  Alert? Yes  Normal Appearance?Yes  Oriented to person? Yes  Place? Yes   Time? Yes  Recall of three objects?  Yes  Can perform simple calculations? Yes  Displays appropriate judgment?Yes  Can read the correct time from a watch face?Yes  HEENT: normocephalic, sclerae anicteric, TMs pearly, nares patent, no discharge or erythema, pharynx normal Oral cavity: MMM, no lesions Neck: supple, no lymphadenopathy, no thyromegaly, no masses Heart: RRR, normal S1, S2, no murmurs Lungs: CTA bilaterally, no wheezes, rhonchi, or rales Abdomen: +bs, soft, non tender, non distended, no masses, no hepatomegaly, no splenomegaly Musculoskeletal: nontender, no swelling, no obvious deformity Extremities: no edema, no cyanosis, no clubbing Pulses: 2+ symmetric upper extremities and lower extremities normal pulses except decreased DP, normal cap refill Neurological: alert, oriented x 3, CN2-12 intact, strength normal upper extremities and lower extremities, sensation decreased right lower leg DTRs 2+ throughout, no cerebellar signs, gait slow with walker Psychiatric: normal affect, behavior normal, pleasant  Breast: defer Gyn: defer Rectal: defer  Medicare Attestation I have personally reviewed: The patient's medical and social history Their use of alcohol, tobacco or illicit drugs Their current medications  and supplements The patient's functional ability including ADLs,fall risks, home safety risks, cognitive, and hearing and visual impairment Diet and physical activities Evidence for depression or mood disorders  The patient's weight, height, BMI, and visual acuity have been recorded in the chart.  I have made referrals, counseling, and provided education to the patient based on review of the above and I have provided the patient with a written personalized care plan for preventive services.     Vicie Mutters, PA-C   05/09/2014

## 2014-05-09 NOTE — Patient Instructions (Signed)

## 2014-06-21 ENCOUNTER — Other Ambulatory Visit: Payer: Self-pay | Admitting: Internal Medicine

## 2014-07-03 ENCOUNTER — Other Ambulatory Visit: Payer: Self-pay | Admitting: Physician Assistant

## 2014-08-13 ENCOUNTER — Ambulatory Visit: Payer: Self-pay | Admitting: Internal Medicine

## 2014-09-06 ENCOUNTER — Encounter: Payer: Self-pay | Admitting: Internal Medicine

## 2014-09-06 ENCOUNTER — Ambulatory Visit (INDEPENDENT_AMBULATORY_CARE_PROVIDER_SITE_OTHER): Payer: Medicare Other | Admitting: Internal Medicine

## 2014-09-06 VITALS — BP 132/64 | HR 64 | Temp 97.3°F | Resp 16 | Ht 58.5 in | Wt 133.4 lb

## 2014-09-06 DIAGNOSIS — Z79899 Other long term (current) drug therapy: Secondary | ICD-10-CM

## 2014-09-06 DIAGNOSIS — E1121 Type 2 diabetes mellitus with diabetic nephropathy: Secondary | ICD-10-CM

## 2014-09-06 DIAGNOSIS — I150 Renovascular hypertension: Secondary | ICD-10-CM

## 2014-09-06 DIAGNOSIS — E559 Vitamin D deficiency, unspecified: Secondary | ICD-10-CM

## 2014-09-06 DIAGNOSIS — E782 Mixed hyperlipidemia: Secondary | ICD-10-CM

## 2014-09-06 LAB — CBC WITH DIFFERENTIAL/PLATELET
BASOS PCT: 0 % (ref 0–1)
Basophils Absolute: 0 10*3/uL (ref 0.0–0.1)
EOS ABS: 0.2 10*3/uL (ref 0.0–0.7)
Eosinophils Relative: 3 % (ref 0–5)
HCT: 35.6 % — ABNORMAL LOW (ref 36.0–46.0)
HEMOGLOBIN: 12.4 g/dL (ref 12.0–15.0)
LYMPHS ABS: 3.3 10*3/uL (ref 0.7–4.0)
Lymphocytes Relative: 42 % (ref 12–46)
MCH: 31.6 pg (ref 26.0–34.0)
MCHC: 34.8 g/dL (ref 30.0–36.0)
MCV: 90.8 fL (ref 78.0–100.0)
MONOS PCT: 11 % (ref 3–12)
Monocytes Absolute: 0.9 10*3/uL (ref 0.1–1.0)
NEUTROS ABS: 3.5 10*3/uL (ref 1.7–7.7)
NEUTROS PCT: 44 % (ref 43–77)
Platelets: 267 10*3/uL (ref 150–400)
RBC: 3.92 MIL/uL (ref 3.87–5.11)
RDW: 13.4 % (ref 11.5–15.5)
WBC: 7.9 10*3/uL (ref 4.0–10.5)

## 2014-09-06 LAB — HEMOGLOBIN A1C
HEMOGLOBIN A1C: 6.6 % — AB (ref <5.7)
Mean Plasma Glucose: 143 mg/dL — ABNORMAL HIGH (ref <117)

## 2014-09-06 LAB — LIPID PANEL
CHOL/HDL RATIO: 5.4 ratio
CHOLESTEROL: 305 mg/dL — AB (ref 0–200)
HDL: 56 mg/dL (ref >39)
LDL Cholesterol: 187 mg/dL — ABNORMAL HIGH (ref 0–99)
TRIGLYCERIDES: 312 mg/dL — AB (ref <150)
VLDL: 62 mg/dL — AB (ref 0–40)

## 2014-09-06 LAB — BASIC METABOLIC PANEL WITH GFR
BUN: 27 mg/dL — ABNORMAL HIGH (ref 6–23)
CALCIUM: 9.4 mg/dL (ref 8.4–10.5)
CO2: 25 mEq/L (ref 19–32)
Chloride: 100 mEq/L (ref 96–112)
Creat: 1.67 mg/dL — ABNORMAL HIGH (ref 0.50–1.10)
GFR, EST AFRICAN AMERICAN: 31 mL/min — AB
GFR, EST NON AFRICAN AMERICAN: 27 mL/min — AB
Glucose, Bld: 130 mg/dL — ABNORMAL HIGH (ref 70–99)
Potassium: 4.6 mEq/L (ref 3.5–5.3)
SODIUM: 133 meq/L — AB (ref 135–145)

## 2014-09-06 LAB — HEPATIC FUNCTION PANEL
ALT: 10 U/L (ref 0–35)
AST: 16 U/L (ref 0–37)
Albumin: 4.6 g/dL (ref 3.5–5.2)
Alkaline Phosphatase: 58 U/L (ref 39–117)
BILIRUBIN TOTAL: 0.4 mg/dL (ref 0.2–1.2)
Total Protein: 7.4 g/dL (ref 6.0–8.3)

## 2014-09-06 LAB — MAGNESIUM: MAGNESIUM: 2.2 mg/dL (ref 1.5–2.5)

## 2014-09-06 LAB — TSH: TSH: 1.99 u[IU]/mL (ref 0.350–4.500)

## 2014-09-06 MED ORDER — TRIAMCINOLONE ACETONIDE 0.1 % EX OINT: TOPICAL_OINTMENT | CUTANEOUS | Status: DC

## 2014-09-06 NOTE — Progress Notes (Signed)
Patient ID: Jeanette Espinoza, female   DOB: January 27, 1925, 80 y.o.   MRN: [id number]   This very nice 80 y.o.female presents for 3 month follow up with Hypertension, Hyperlipidemia, Pre-Diabetes and Vitamin D Deficiency. Patient has few pruritic patches over her chest & left shoulder.   Patient is treated for HTN & BP has been controlled at home. Today's BP: 132/64 mmHg. Patient has had no complaints of any cardiac type chest pain, palpitations, dyspnea/orthopnea/PND, dizziness, claudication, or dependent edema.   Hyperlipidemia is controlled with diet with consensus not to treat with meds given her age an lack of CV risk. Patient denies myalgias or other med SE's. Last Lipids were at goal - Total Chol 180; HDL  55; LDL 87; [id number] on 05/09/2014.   Also, the patient has history of diet controlled T2_NIDDM w/Stage 3/4 CKD (GFR 31 ml/min) and has had no symptoms of reactive hypoglycemia, diabetic polys, paresthesias or visual blurring.  Last A1c was  6.6% on 05/09/2014.    Further, the patient also has history of Vitamin D Deficiency and supplements vitamin D without any suspected side-effects. Last vitamin D was  78 on 02/06/2014.   Medication List   aspirin 81 MG tablet  Take 81 mg by mouth daily.     atenolol 50 MG tablet  Commonly known as:  TENORMIN  Take 1/2-1 tab at bedtime for blood pressure     atorvastatin 80 MG tablet  Commonly known as:  LIPITOR  Take 80 mg by mouth daily.     enalapril 20 MG tablet  Commonly known as:  VASOTEC  TAKE ONE TABLET BY MOUTH NIGHTLY AT BEDTIME FOR BLOOD PRESSURE     Magnesium 250 MG Tabs  Take by mouth 2 (two) times daily.     Vitamin D 2000 UNITS Caps  Take 6,000 Units by mouth daily.     No Known Allergies  PMHx:   Past Medical History  Diagnosis Date  . HTN (hypertension)     echo (11/11) with EF 55-60%, mild LV hypertrophy, PA systolic pressure 34 mmHg  . Pre-diabetes     diet controlled  . HLD (hyperlipidemia)   . Low back pain   .  Arthritis   . Unspecified vitamin D deficiency    Immunization History  Administered Date(s) Administered  . Influenza Split 10/11/2013  . Influenza-Unspecified 10/10/2012  . Pneumococcal Polysaccharide-23 10/11/2013  . Tdap 09/21/2012   Past Surgical History  Procedure Laterality Date  . Lexiscan myoview  11/11    EF 81%, normal perfusion images suggesting no ischemia or infarction  . Surgery for ddd    . Cataract surgery    . Abnormal ekg    . Back surgery    . Total hip arthroplasty  12/02/2012    Procedure: TOTAL HIP ARTHROPLASTY;  Surgeon: Gearlean Alf, MD;  Location: [facility];  Service: Orthopedics;  Laterality: Right;  . Eye surgery Bilateral     cataract  . Spine surgery      lumbar  . Abdominal hysterectomy      partial  . Joint replacement      left hip replacement , right knee replacement    FHx:    Reviewed / unchanged  SHx:    Reviewed / unchanged  Systems Review:  Constitutional: Denies fever, chills, wt changes, headaches, insomnia, fatigue, night sweats, change in appetite. Eyes: Denies redness, blurred vision, diplopia, discharge, itchy, watery eyes.  ENT: Denies discharge, congestion, post nasal drip, epistaxis, sore throat, earache,  hearing loss, dental pain, tinnitus, vertigo, sinus pain, snoring.  CV: Denies chest pain, palpitations, irregular heartbeat, syncope, dyspnea, diaphoresis, orthopnea, PND, claudication or edema. Respiratory: denies cough, dyspnea, DOE, pleurisy, hoarseness, laryngitis, wheezing.  Gastrointestinal: Denies dysphagia, odynophagia, heartburn, reflux, water brash, abdominal pain or cramps, nausea, vomiting, bloating, diarrhea, constipation, hematemesis, melena, hematochezia  or hemorrhoids. Genitourinary: Denies dysuria, frequency, urgency, nocturia, hesitancy, discharge, hematuria or flank pain. Musculoskeletal: Denies arthralgias, myalgias, stiffness, jt. swelling, pain, limping or strain/sprain.  Skin: Denies pruritus, rash,  hives, warts, acne, eczema or change in skin lesion(s). Neuro: No weakness, tremor, incoordination, spasms, paresthesia or pain. Psychiatric: Denies confusion, memory loss or sensory loss. Endo: Denies change in weight, skin or hair change.  Heme/Lymph: No excessive bleeding, bruising or enlarged lymph nodes.  Exam:  BP 132/64  Pulse 64  Temp(Src) 97.3 F (36.3 C)  Resp 16  Ht 4' 10.5" (1.486 m)  Wt 133 lb 6.4 oz (60.51 kg)  BMI 27.40 kg/m2  Appears well nourished and in no distress. Eyes: PERRLA, EOMs, conjunctiva no swelling or erythema. Sinuses: No frontal/maxillary tenderness ENT/Mouth: EAC's clear, TM's nl w/o erythema, bulging. Nares clear w/o erythema, swelling, exudates. Oropharynx clear without erythema or exudates. Oral hygiene is good. Tongue normal, non obstructing. Hearing intact.  Neck: Supple. Thyroid nl. Car 2+/2+ without bruits, nodes or JVD. Chest: Respirations nl with BS clear & equal w/o rales, rhonchi, wheezing or stridor.  Cor: Heart sounds normal w/ regular rate and rhythm without sig. murmurs, gallops, clicks, or rubs. Peripheral pulses normal and equal  without edema.  Abdomen: Soft & bowel sounds normal. Non-tender w/o guarding, rebound, hernias, masses, or organomegaly.  Lymphatics: Unremarkable.  Musculoskeletal: Full ROM all peripheral extremities, joint stability, 5/5 strength, and normal gait.  Skin: Warm, dry without exposed lesions or ecchymosis apparent. Does have a few patches of eczema over the right ear, chest &lLeft shoulder. Neuro: Cranial nerves intact, reflexes equal bilaterally. Sensory-motor testing grossly intact. Tendon reflexes grossly intact.  Pysch: Alert & oriented x 3.  Insight and judgement nl & appropriate. No ideations.  Assessment and Plan:  1. Hypertension - Continue monitor blood pressure at home. Continue diet/meds same.  2. Hyperlipidemia - Continue diet, exercise,& lifestyle modifications. Continue monitor periodic  cholesterol/liver & renal functions   3. T2_NIDDM w/CKD3/4 - Continue diet, exercise, lifestyle modifications. Monitor appropriate labs.  4. Vitamin D Deficiency - Continue supplementation.   Recommended regular exercise, BP monitoring, weight control, and discussed med and SE's. Recommended labs to assess and monitor clinical status. Further disposition pending results of labs. Try Kenalog For eczema.

## 2014-09-06 NOTE — Patient Instructions (Signed)

## 2014-09-07 LAB — VITAMIN D 25 HYDROXY (VIT D DEFICIENCY, FRACTURES): VIT D 25 HYDROXY: 76 ng/mL (ref 30–89)

## 2014-09-07 LAB — INSULIN, FASTING: Insulin fasting, serum: 13.1 u[IU]/mL (ref 2.0–19.6)

## 2014-09-18 ENCOUNTER — Ambulatory Visit (INDEPENDENT_AMBULATORY_CARE_PROVIDER_SITE_OTHER): Payer: Medicare Other | Admitting: *Deleted

## 2014-09-18 DIAGNOSIS — Z23 Encounter for immunization: Secondary | ICD-10-CM

## 2014-09-28 ENCOUNTER — Other Ambulatory Visit: Payer: Self-pay

## 2014-09-28 MED ORDER — ENALAPRIL MALEATE 20 MG PO TABS: ORAL_TABLET | ORAL | Status: DC

## 2014-10-17 ENCOUNTER — Encounter: Payer: Self-pay | Admitting: Emergency Medicine

## 2014-11-03 ENCOUNTER — Encounter: Payer: Self-pay | Admitting: *Deleted

## 2014-11-13 ENCOUNTER — Encounter: Payer: Self-pay | Admitting: Emergency Medicine

## 2014-11-26 ENCOUNTER — Encounter: Payer: Self-pay | Admitting: Internal Medicine

## 2014-12-19 ENCOUNTER — Ambulatory Visit (INDEPENDENT_AMBULATORY_CARE_PROVIDER_SITE_OTHER): Payer: 59 | Admitting: Internal Medicine

## 2014-12-19 ENCOUNTER — Encounter: Payer: Self-pay | Admitting: Internal Medicine

## 2014-12-19 VITALS — BP 118/72 | HR 64 | Temp 98.1°F | Resp 16 | Ht 58.5 in | Wt 131.4 lb

## 2014-12-19 DIAGNOSIS — E1121 Type 2 diabetes mellitus with diabetic nephropathy: Secondary | ICD-10-CM

## 2014-12-19 DIAGNOSIS — Z1331 Encounter for screening for depression: Secondary | ICD-10-CM

## 2014-12-19 DIAGNOSIS — M160 Bilateral primary osteoarthritis of hip: Secondary | ICD-10-CM

## 2014-12-19 DIAGNOSIS — Z79899 Other long term (current) drug therapy: Secondary | ICD-10-CM

## 2014-12-19 DIAGNOSIS — R6889 Other general symptoms and signs: Secondary | ICD-10-CM

## 2014-12-19 DIAGNOSIS — Z0001 Encounter for general adult medical examination with abnormal findings: Secondary | ICD-10-CM

## 2014-12-19 DIAGNOSIS — E559 Vitamin D deficiency, unspecified: Secondary | ICD-10-CM

## 2014-12-19 DIAGNOSIS — Z9181 History of falling: Secondary | ICD-10-CM

## 2014-12-19 DIAGNOSIS — Z23 Encounter for immunization: Secondary | ICD-10-CM

## 2014-12-19 DIAGNOSIS — Z1212 Encounter for screening for malignant neoplasm of rectum: Secondary | ICD-10-CM

## 2014-12-19 DIAGNOSIS — E782 Mixed hyperlipidemia: Secondary | ICD-10-CM

## 2014-12-19 DIAGNOSIS — I10 Essential (primary) hypertension: Secondary | ICD-10-CM

## 2014-12-19 DIAGNOSIS — Z0000 Encounter for general adult medical examination without abnormal findings: Secondary | ICD-10-CM

## 2014-12-19 LAB — CBC WITH DIFFERENTIAL/PLATELET
Basophils Absolute: 0 10*3/uL (ref 0.0–0.1)
Basophils Relative: 0 % (ref 0–1)
EOS ABS: 0.1 10*3/uL (ref 0.0–0.7)
Eosinophils Relative: 2 % (ref 0–5)
HCT: 35.5 % — ABNORMAL LOW (ref 36.0–46.0)
HEMOGLOBIN: 11.9 g/dL — AB (ref 12.0–15.0)
LYMPHS ABS: 2.7 10*3/uL (ref 0.7–4.0)
LYMPHS PCT: 42 % (ref 12–46)
MCH: 31.2 pg (ref 26.0–34.0)
MCHC: 33.5 g/dL (ref 30.0–36.0)
MCV: 92.9 fL (ref 78.0–100.0)
MPV: 10.3 fL (ref 8.6–12.4)
Monocytes Absolute: 0.5 10*3/uL (ref 0.1–1.0)
Monocytes Relative: 8 % (ref 3–12)
NEUTROS PCT: 48 % (ref 43–77)
Neutro Abs: 3.1 10*3/uL (ref 1.7–7.7)
Platelets: 246 10*3/uL (ref 150–400)
RBC: 3.82 MIL/uL — AB (ref 3.87–5.11)
RDW: 13.6 % (ref 11.5–15.5)
WBC: 6.5 10*3/uL (ref 4.0–10.5)

## 2014-12-19 NOTE — Patient Instructions (Signed)

## 2014-12-19 NOTE — Progress Notes (Signed)
Patient ID: Jeanette Espinoza, female   DOB: [birth year], 81 y.o.   MRN: [id number]  [facility] VISIT AND CPE  Assessment:   1. Essential hypertension  - Microalbumin / creatinine urine ratio - EKG 12-Lead - [place], RETROPERITNL ABD,  LTD - TSH  2. Hyperlipidemia  - Lipid panel  3. Type 2 diabetes mellitus with diabetic nephropathy  - Hemoglobin A1c - Insulin, fasting  4. Vitamin D deficiency  - Vit D  25 hydroxy (rtn osteoporosis monitoring)  5. Primary osteoarthritis of both hips   6. Medication management   7. Screening for rectal cancer  - POC Hemoccult Bld/Stl (3-Cd Home Screen); Future  8. Depression screen   9. At low risk for fall  - Urine Microscopic - CBC with Differential/Platelet - BASIC METABOLIC PANEL WITH GFR - Hepatic function panel - Magnesium  10. Routine general medical examination at a health care facility   Plan:   During the course of the visit the patient was educated and counseled about appropriate screening and preventive services including:    Pneumococcal vaccine   Influenza vaccine  Td vaccine  Screening electrocardiogram  Bone densitometry screening  Colorectal cancer screening  Diabetes screening  Glaucoma screening  Nutrition counseling   Advanced directives: requested  Screening recommendations, referrals: Vaccinations:  Immunization History  Administered Date(s) Administered  . Influenza Split 10/11/2013  . Influenza, High Dose Seasonal PF 09/18/2014  . Influenza-Unspecified 10/10/2012  . Pneumococcal Conjugate-13 12/19/2014  . Pneumococcal Polysaccharide-23 10/11/2013  . Tdap 09/21/2012    Shingles vaccine declined Hep B vaccine not indicated  Nutrition assessed and recommended  Colonoscopy 2008 Neg - Dr Amedeo Plenty Recommended yearly ophthalmology/optometry visit for glaucoma screening and checkup Recommended yearly dental visit for hygiene and checkup Advanced directives -  yes  Conditions/risks identified: BMI: Discussed weight loss, diet, and increase physical activity.  Increase physical activity: AHA recommends 150 minutes of physical activity a week.  Medications reviewed Diabetes is not at goal, ACE/ARB therapy: Yes. Urinary Incontinence is not an issue: discussed non pharmacology and pharmacology options.  Fall risk: low- discussed PT, home fall assessment, medications.   Subjective:   Jeanette Espinoza is a 81 y.o.  [place] who presents for Medicare Annual Wellness Visit and complete physical.  Date of last medicare wellness visit is unknown.  She has had elevated blood pressure since Dec 2007. Her blood pressure has been controlled at home, & today their BP is BP: 118/72 mmHg She does not workout. She denies chest pain, shortness of breath, dizziness. Patient had a negative Cardiolite in 2010. She is not on cholesterol medication and denies myalgias. Her cholesterol is not at goal. The cholesterol last visit was:  Lab Results  Component Value Date   CHOL 305* 09/06/2014   HDL 56 09/06/2014   LDLCALC 187* 09/06/2014   TRIG 312* 09/06/2014   CHOLHDL 5.4 09/06/2014   She has had diabetes for 10 years (2006). She has been working on diet and exercise for prediabetes, and denies foot ulcerations, hyperglycemia, hypoglycemia , paresthesia of the feet, polydipsia, polyuria and visual disturbances. Last A1C in the office was:  Lab Results  Component Value Date   HGBA1C 6.6* 09/06/2014    Patient is on Vitamin D supplement.   Lab Results  Component Value Date   VD25OH 76 09/06/2014      Names of Other Physician/Practitioners you currently use: 1. [facility] here for primary care 2. Dr Donato Heinz, eye doctor, last  visit Nov 2015 3. Dr Lavone Neri, [place], dentist, last visit due Mar 2016  Patient Care Team: Unk Pinto, MD as PCP - General (Internal Medicine) Gearlean Alf, MD as Consulting Physician  (Orthopedic Surgery) Harrie Foreman, [place] as Referring Physician (Optometry) Missy Sabins, MD as Consulting Physician (Gastroenterology) Larey Dresser, MD as Consulting Physician (Cardiology) Cindee Salt, MD as Consulting Physician (Physical Medicine and Rehabilitation)  Medication Review: Current Outpatient Prescriptions on File Prior to Visit  Medication Sig Dispense Refill  . Cholecalciferol (VITAMIN D) 2000 UNITS CAPS Take 6,000 Units by mouth daily.    . enalapril (VASOTEC) 20 MG tablet TAKE ONE TABLET BY MOUTH NIGHTLY AT BEDTIME FOR BLOOD PRESSURE 90 tablet 3  . Magnesium 250 MG TABS Take by mouth 2 (two) times daily.    Marland Kitchen triamcinolone ointment (KENALOG) 0.1 % Apply to rash 2-3 x daily as needed 30 g 3   No current facility-administered medications on file prior to visit.    Current Problems (verified) Patient Active Problem List   Diagnosis Date Noted  . Medication management 02/06/2014  . 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 HTN (hypertension)   . Hyperlipidemia   . Vitamin D deficiency   . OA (osteoarthritis) of hip 12/02/2012    Screening Tests Health Maintenance  Topic Date Due  . ZOSTAVAX  06/04/1987  . DEXA SCAN  06/03/1992  . URINE MICROALBUMIN  10/11/2014  . HEMOGLOBIN A1C  03/08/2015  . FOOT EXAM  05/10/2015  . INFLUENZA VACCINE  06/10/2015  . OPHTHALMOLOGY EXAM  09/18/2015  . COLONOSCOPY  11/09/2016  . TETANUS/TDAP  09/21/2022  . PNEUMOCOCCAL POLYSACCHARIDE VACCINE AGE 47 AND OVER  Completed    Immunization History  Administered Date(s) Administered  . Influenza Split 10/11/2013  . Influenza, High Dose Seasonal PF 09/18/2014  . Influenza-Unspecified 10/10/2012  . Pneumococcal Conjugate-13 12/19/2014  . Pneumococcal Polysaccharide-23 10/11/2013  . Tdap 09/21/2012    Preventative care: Last colonoscopy: 2008 - neg - Dr Amedeo Plenty  History reviewed: allergies, current medications, past family history, past medical history, past social  history, past surgical history and problem list  Risk Factors: Tobacco History  Substance Use Topics  . Smoking status: Former Smoker    Quit date: 11/09/1970  . Smokeless tobacco: Not on file  . Alcohol Use: Yes     Comment: occasional    She does not smoke.  Patient is a former smoker. Are there smokers in your home (other than you)?  No Alcohol Current alcohol use: glass of wine with dinner  Caffeine Current caffeine use: coffee 3 cups /day  Exercise Current exercise: walking  Nutrition/Diet Current diet: in general, a "healthy" diet    Cardiac risk factors: advanced age (older than 43 for men, 50 for women), dyslipidemia, hypertension, sedentary lifestyle and smoking/ tobacco exposure.  Depression Screen (Note: if answer to either of the following is "Yes", a more complete depression screening is indicated)   Q1: Over the past two weeks, have you felt down, depressed or hopeless? No  Q2: Over the past two weeks, have you felt little interest or pleasure in doing things? No  Have you lost interest or pleasure in daily life? No  Do you often feel hopeless? No  Do you cry easily over simple problems? No  Activities of Daily Living In your present state of health, do you have any difficulty performing the following activities?:  Driving? No Managing money?  No Feeding yourself? No Getting from bed to chair?  No Climbing a flight of stairs? No Preparing food and eating?: No Bathing or showering? No Getting dressed: No Getting to the toilet? No Using the toilet:No Moving around from place to place: No In the past year have you fallen or had a near fall?:No   Are you sexually active?  No  Do you have more than one partner?  No  Vision Difficulties: No  Hearing Difficulties: No Do you often ask people to speak up or repeat themselves? No Do you experience ringing or noises in your ears? No Do you have difficulty understanding soft or whispered voices?  Sometimes.  Cognition  Do you feel that you have a problem with memory?No  Do you often misplace items? No  Do you feel safe at home?  Yes  Advanced directives Does patient have a [place]? Yes Does patient have a Living Will? Yes  Past Medical History  Diagnosis Date  . HTN (hypertension)     echo (11/11) with EF 55-60%, mild LV hypertrophy, PA systolic pressure 34 mmHg  . Pre-diabetes     diet controlled  . HLD (hyperlipidemia)   . Low back pain   . Arthritis   . Unspecified vitamin D deficiency    Past Surgical History  Procedure Laterality Date  . Lexiscan myoview  11/11    EF 81%, normal perfusion images suggesting no ischemia or infarction  . Surgery for ddd    . Cataract surgery    . Abnormal ekg    . Back surgery    . Total hip arthroplasty  12/02/2012    Procedure: TOTAL HIP ARTHROPLASTY;  Surgeon: Gearlean Alf, MD;  Location: [facility];  Service: Orthopedics;  Laterality: Right;  . Eye surgery Bilateral     cataract  . Spine surgery      lumbar  . Abdominal hysterectomy      partial  . Joint replacement      left hip replacement , right knee replacement     ROS: Constitutional: Denies fever, chills, weight loss/gain, headaches, insomnia, fatigue, night sweats, and change in appetite. Eyes: Denies redness, blurred vision, diplopia, discharge, itchy, watery eyes.  ENT: Denies discharge, congestion, post nasal drip, epistaxis, sore throat, earache, hearing loss, dental pain, Tinnitus, Vertigo, Sinus pain, snoring.  Cardio: Denies chest pain, palpitations, irregular heartbeat, syncope, dyspnea, diaphoresis, orthopnea, PND, claudication, edema Respiratory: denies cough, dyspnea, DOE, pleurisy, hoarseness, laryngitis, wheezing.  Gastrointestinal: Denies dysphagia, heartburn, reflux, water brash, pain, cramps, nausea, vomiting, bloating, diarrhea, constipation, hematemesis, melena, hematochezia, jaundice, hemorrhoids Genitourinary: Denies  dysuria, frequency, urgency, nocturia, hesitancy, discharge, hematuria, flank pain Breast: Breast lumps, nipple discharge, bleeding.  Musculoskeletal: Denies arthralgia, myalgia, stiffness, Jt. Swelling, pain, limp, and strain/sprain. Denies falls. Skin: Denies puritis, rash, hives, warts, acne, eczema, changing in skin lesion Neuro: No weakness, tremor, incoordination, spasms, paresthesia, pain Psychiatric: Denies confusion, memory loss, sensory loss. Denies Depression. Endocrine: Denies change in weight, skin, hair change, nocturia, and paresthesia, diabetic polys, visual blurring, hyper / hypo glycemic episodes.  Heme/Lymph: No excessive bleeding, bruising, enlarged lymph nodes  Objective:     BP 118/72 mmHg  Pulse 64  Temp(Src) 98.1 F (36.7 C)  Resp 16  Ht 4' 10.5" (1.486 m)  Wt 131 lb 6.4 oz (59.603 kg)  BMI 26.99 kg/m2  General Appearance: Well nourished, alert, WD/WN, femaleand in no apparent distress. Eyes: PERRLA, EOMs, conjunctiva no swelling or erythema, normal fundi and vessels. Sinuses: No frontal/maxillary tenderness ENT/Mouth: EACs patent / TMs  nl. Nares clear without erythema, swelling, mucoid exudates. Oral hygiene is good. No erythema, swelling, or exudate. Tongue normal, non-obstructing. Tonsils not swollen or erythematous. Hearing normal.  Neck: Supple, thyroid normal. No bruits, nodes or JVD. Respiratory: Respiratory effort normal.  BS equal and clear bilateral without rales, rhonci, wheezing or stridor. Cardio: Heart sounds are normal with regular rate and rhythm and no murmurs, rubs or gallops. Peripheral pulses are normal and equal bilaterally without edema. No aortic or femoral bruits. Chest: symmetric with normal excursions and percussion. Breasts: Symmetric, without lumps, nipple discharge, retractions, or fibrocystic changes.  Abdomen: Flat, soft  with nl bowel sounds. Nontender, no guarding, rebound, hernias, masses, or organomegaly.  Lymphatics: Non  tender without lymphadenopathy.  Musculoskeletal: Full ROM all peripheral extremities, joint stability, 5/5 strength, and normal gait. Skin: Warm and dry without rashes, lesions, cyanosis, clubbing or  ecchymosis.  Neuro: Cranial nerves intact, reflexes equal bilaterally. Normal muscle tone, no cerebellar symptoms. Sensation intact.  Pysch: Awake and oriented X 3, normal affect, Insight and Judgment appropriate.   Cognitive Testing  Alert? Yes  Normal Appearance?Yes  Oriented to person? Yes  Place? Yes   Time? Yes  Recall of three objects?  Yes  Can perform simple calculations? Yes  Displays appropriate judgment? Yes  Can read the correct time from a watch/clock?Yes  Medicare Attestation I have personally reviewed: The patient's medical and social history Their use of alcohol, tobacco or illicit drugs Their current medications and supplements The patient's functional ability including ADLs,fall risks, home safety risks, cognitive, and hearing and visual impairment Diet and physical activities Evidence for depression or mood disorders  The patient's weight, height, BMI, and visual acuity have been recorded in the chart.  I have made referrals, counseling, and provided education to the patient based on review of the above and I have provided the patient with a written personalized care plan for preventive services.    Cristabel Bicknell DAVID, MD   12/19/2014

## 2014-12-20 LAB — MAGNESIUM: Magnesium: 2.3 mg/dL (ref 1.5–2.5)

## 2014-12-20 LAB — LIPID PANEL
CHOLESTEROL: 290 mg/dL — AB (ref 0–200)
HDL: 51 mg/dL (ref >39)
LDL Cholesterol: 177 mg/dL — ABNORMAL HIGH (ref 0–99)
Total CHOL/HDL Ratio: 5.7 Ratio
Triglycerides: 312 mg/dL — ABNORMAL HIGH (ref <150)
VLDL: 62 mg/dL — ABNORMAL HIGH (ref 0–40)

## 2014-12-20 LAB — BASIC METABOLIC PANEL WITH GFR
BUN: 26 mg/dL — ABNORMAL HIGH (ref 6–23)
CALCIUM: 9.4 mg/dL (ref 8.4–10.5)
CHLORIDE: 101 meq/L (ref 96–112)
CO2: 26 mEq/L (ref 19–32)
CREATININE: 1.61 mg/dL — AB (ref 0.50–1.10)
GFR, Est African American: 33 mL/min — ABNORMAL LOW
GFR, Est Non African American: 29 mL/min — ABNORMAL LOW
GLUCOSE: 129 mg/dL — AB (ref 70–99)
Potassium: 5.2 mEq/L (ref 3.5–5.3)
Sodium: 135 mEq/L (ref 135–145)

## 2014-12-20 LAB — INSULIN, FASTING: INSULIN FASTING, SERUM: 10.8 u[IU]/mL (ref 2.0–19.6)

## 2014-12-20 LAB — TSH: TSH: 2.233 u[IU]/mL (ref 0.350–4.500)

## 2014-12-20 LAB — HEPATIC FUNCTION PANEL
ALK PHOS: 63 U/L (ref 39–117)
ALT: 11 U/L (ref 0–35)
AST: 16 U/L (ref 0–37)
Albumin: 4.4 g/dL (ref 3.5–5.2)
Bilirubin, Direct: <0.1 mg/dL (ref 0.0–0.3)
Total Bilirubin: 0.4 mg/dL (ref 0.2–1.2)
Total Protein: 7.2 g/dL (ref 6.0–8.3)

## 2014-12-20 LAB — HEMOGLOBIN A1C
Hgb A1c MFr Bld: 6.5 % — ABNORMAL HIGH (ref <5.7)
MEAN PLASMA GLUCOSE: 140 mg/dL — AB (ref <117)

## 2014-12-20 LAB — VITAMIN D 25 HYDROXY (VIT D DEFICIENCY, FRACTURES): Vit D, 25-Hydroxy: 43 ng/mL (ref 30–100)

## 2015-05-15 ENCOUNTER — Ambulatory Visit: Payer: Medicare Other | Admitting: Podiatry

## 2015-05-17 ENCOUNTER — Ambulatory Visit (INDEPENDENT_AMBULATORY_CARE_PROVIDER_SITE_OTHER): Payer: Medicare Other | Admitting: Podiatry

## 2015-05-17 DIAGNOSIS — M2040 Other hammer toe(s) (acquired), unspecified foot: Secondary | ICD-10-CM | POA: Diagnosis not present

## 2015-05-17 DIAGNOSIS — L84 Corns and callosities: Secondary | ICD-10-CM

## 2015-05-18 NOTE — Progress Notes (Signed)
Subjective:     Patient ID: Jeanette Espinoza, female   DOB: 1925/05/16, 81 y.o.   MRN: [id number]  HPIThis patient presents to the office with pain in her second and fifth toes due to her corns.  He says the corns are painful in shoes and are worsening over time.She presents for preventive foot care services.   Review of Systems     Objective:   Physical Exam Objective: Review of past medical history, medications, social history and allergies were performed.  Vascular: Dorsalis pedis and posterior tibial pulses were palpable B/L, capillary refill was  WNL B/L, temperature gradient was WNL B/L   Skin:  No signs of symptoms of infection or ulcers on both feet. Heloma durum second and fifth toe right foot.  Nails: appear healthy with no signs of mycosis or infections  Sensory: Semmes Weinstein monifilament WNL   Orthopedic: Orthopedic evaluation demonstrates all joints distal t ankle have full ROM without crepitus, muscle power WNL B/L. Severe HAV deformity right foot.  Hammer toe deformities 2-5 B/L.  Marland Kitchen     Assessment:     Hammer toe B/L     Plan:     IE   Debride heloma durum

## 2015-06-19 ENCOUNTER — Encounter: Payer: Self-pay | Admitting: Physician Assistant

## 2015-06-19 ENCOUNTER — Ambulatory Visit (INDEPENDENT_AMBULATORY_CARE_PROVIDER_SITE_OTHER): Payer: Medicare Other | Admitting: Physician Assistant

## 2015-06-19 VITALS — BP 142/62 | HR 60 | Temp 97.5°F | Resp 16 | Ht 58.5 in | Wt 131.4 lb

## 2015-06-19 DIAGNOSIS — I10 Essential (primary) hypertension: Secondary | ICD-10-CM | POA: Diagnosis not present

## 2015-06-19 DIAGNOSIS — E1121 Type 2 diabetes mellitus with diabetic nephropathy: Secondary | ICD-10-CM

## 2015-06-19 DIAGNOSIS — Z79899 Other long term (current) drug therapy: Secondary | ICD-10-CM | POA: Diagnosis not present

## 2015-06-19 DIAGNOSIS — E782 Mixed hyperlipidemia: Secondary | ICD-10-CM | POA: Diagnosis not present

## 2015-06-19 DIAGNOSIS — E559 Vitamin D deficiency, unspecified: Secondary | ICD-10-CM | POA: Diagnosis not present

## 2015-06-19 LAB — CBC WITH DIFFERENTIAL/PLATELET
BASOS ABS: 0 10*3/uL (ref 0.0–0.1)
BASOS PCT: 0 % (ref 0–1)
Eosinophils Absolute: 0.2 10*3/uL (ref 0.0–0.7)
Eosinophils Relative: 3 % (ref 0–5)
HCT: 36.1 % (ref 36.0–46.0)
HEMOGLOBIN: 12.5 g/dL (ref 12.0–15.0)
LYMPHS ABS: 2.4 10*3/uL (ref 0.7–4.0)
LYMPHS PCT: 40 % (ref 12–46)
MCH: 31.8 pg (ref 26.0–34.0)
MCHC: 34.6 g/dL (ref 30.0–36.0)
MCV: 91.9 fL (ref 78.0–100.0)
MPV: 10.2 fL (ref 8.6–12.4)
Monocytes Absolute: 0.5 10*3/uL (ref 0.1–1.0)
Monocytes Relative: 8 % (ref 3–12)
NEUTROS PCT: 49 % (ref 43–77)
Neutro Abs: 3 10*3/uL (ref 1.7–7.7)
Platelets: 213 10*3/uL (ref 150–400)
RBC: 3.93 MIL/uL (ref 3.87–5.11)
RDW: 13.1 % (ref 11.5–15.5)
WBC: 6.1 10*3/uL (ref 4.0–10.5)

## 2015-06-19 LAB — HEMOGLOBIN A1C
Hgb A1c MFr Bld: 6.6 % — ABNORMAL HIGH (ref <5.7)
Mean Plasma Glucose: 143 mg/dL — ABNORMAL HIGH (ref <117)

## 2015-06-19 MED ORDER — TRIAMCINOLONE ACETONIDE 0.1 % EX OINT: TOPICAL_OINTMENT | CUTANEOUS | Status: DC

## 2015-06-19 NOTE — Progress Notes (Signed)
Assessment and Plan:  1. Hypertension -Continue medication, monitor blood pressure at home. Continue DASH diet.  Reminder to go to the ER if any CP, SOB, nausea, dizziness, severe HA, changes vision/speech, left arm numbness and tingling and jaw pain.  2. Cholesterol -Continue diet and exercise. Check cholesterol.   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 Check A1C  4. Vitamin D Def - check level and continue medications.   Continue diet and meds as discussed. Further disposition pending results of labs. Discussed med's effects and SE's.    Over 30 minutes of exam, counseling, chart review, and critical decision making was performed   HPI 81 y.o. female  presents for 3 month follow up on hypertension, cholesterol, diabetes and vitamin D deficiency.   Her blood pressure has been controlled at home, today her BP is BP: (!) 142/62 mmHg.  She does not workout due to heat, walks with a walker. She lives [facility] She denies chest pain, shortness of breath, dizziness. She did have a fall the end of June, no LOC, no fracture. Due to slippers she was wearing, denies dizziness, SOB, CP prior. Landed on her right arm but it is getting better.   She is not on cholesterol medication and denies myalgias. Her cholesterol is not at goal. The cholesterol was:   Lab Results  Component Value Date   CHOL 290* 12/19/2014   HDL 51 12/19/2014   LDLCALC 177* 12/19/2014   TRIG 312* 12/19/2014   CHOLHDL 5.7 12/19/2014    She has been working on diet and exercise for diabetes with diabetic chronic kidney disease, she is not on bASA due to easy bruising, she is on ACE/ARB, and denies  hypoglycemia , paresthesia of the feet, polydipsia, polyuria and visual disturbances. Last A1C was:  Lab Results  Component Value Date   HGBA1C 6.5* 12/19/2014    Patient is on Vitamin D supplement, 6000 daily. Lab Results  Component Value Date   VD25OH 43 12/19/2014    Current  Medications:  Current Outpatient Prescriptions on File Prior to Visit  Medication Sig Dispense Refill  . atenolol (TENORMIN) 50 MG tablet Take 50 mg by mouth daily.    . Cholecalciferol (VITAMIN D) 2000 UNITS CAPS Take 6,000 Units by mouth daily.    . enalapril (VASOTEC) 20 MG tablet TAKE ONE TABLET BY MOUTH NIGHTLY AT BEDTIME FOR BLOOD PRESSURE 90 tablet 3  . Magnesium 250 MG TABS Take by mouth 2 (two) times daily.    Marland Kitchen triamcinolone ointment (KENALOG) 0.1 % Apply to rash 2-3 x daily as needed 30 g 3   No current facility-administered medications on file prior to visit.   Medical History:  Past Medical History  Diagnosis Date  . HTN (hypertension)     echo (11/11) with EF 55-60%, mild LV hypertrophy, PA systolic pressure 34 mmHg  . Pre-diabetes     diet controlled  . HLD (hyperlipidemia)   . Low back pain   . Arthritis   . Unspecified vitamin D deficiency    Allergies: No Known Allergies   Review of Systems:  Review of Systems  Constitutional: Negative.   HENT: Negative.   Respiratory: Negative.   Cardiovascular: Negative.   Gastrointestinal: Negative.   Genitourinary: Negative.   Musculoskeletal: Positive for joint pain (right shoulder improving) and falls (x 1 in June, no fractures/LOC). Negative for myalgias, back pain and neck pain.  Skin: Negative.   Neurological: Negative.   Endo/Heme/Allergies: Negative for environmental  allergies and polydipsia. Bruises/bleeds easily (on ASA).  Psychiatric/Behavioral: Negative.     Family history- Review and unchanged Social history- Review and unchanged Physical Exam: BP 142/62 mmHg  Pulse 60  Temp(Src) 97.5 F (36.4 C)  Resp 16  Ht 4' 10.5" (1.486 m)  Wt 131 lb 6.4 oz (59.603 kg)  BMI 26.99 kg/m2 Wt Readings from Last 3 Encounters:  06/19/15 131 lb 6.4 oz (59.603 kg)  12/19/14 131 lb 6.4 oz (59.603 kg)  09/06/14 133 lb 6.4 oz (60.51 kg)   General Appearance: Well nourished, in no apparent distress. Eyes: PERRLA,  EOMs, conjunctiva no swelling or erythema Sinuses: No Frontal/maxillary tenderness ENT/Mouth: Ext aud canals clear, TMs without erythema, bulging. No erythema, swelling, or exudate on post pharynx.  Tonsils not swollen or erythematous. Hearing normal.  Neck: Supple, thyroid normal.  Respiratory: Respiratory effort normal, BS equal bilaterally without rales, rhonchi, wheezing or stridor.  Cardio: RRR with no MRGs. Brisk peripheral pulses without edema.  Abdomen: Soft, + BS.  Non tender, no guarding, rebound, hernias, masses. Lymphatics: Non tender without lymphadenopathy.  Musculoskeletal: Full ROM, 5/5 strength, Ambulates with walker, slow Skin: Warm, dry without rashes, lesions, ecchymosis.  Neuro: Cranial nerves intact. No cerebellar symptoms.  Psych: Awake and oriented X 3, normal affect, Insight and Judgment appropriate.    Vicie Mutters, PA-C 9:47 AM [facility]

## 2015-06-19 NOTE — Patient Instructions (Signed)
Please monitor your blood pressure, as we get older our body can not respond to a low blood pressure as well as it did when we were younger, for this reason we want a bit higher of a blood pressure as you get older to avoid dizziness and fatigue which can lead to falls. Pease call if your blood pressure is consistently above 150/90.  Go to the ER if any CP, SOB, nausea, dizziness, severe HA, changes vision/speech  DASH Eating Plan DASH stands for "Dietary Approaches to Stop Hypertension." The DASH eating plan is a healthy eating plan that has been shown to reduce high blood pressure (hypertension). Additional health benefits may include reducing the risk of type 2 diabetes mellitus, heart disease, and stroke. The DASH eating plan may also help with weight loss. WHAT DO I NEED TO KNOW ABOUT THE DASH EATING PLAN? For the DASH eating plan, you will follow these general guidelines:  Choose foods with a percent daily value for sodium of less than 5% (as listed on the food label).  Use salt-free seasonings or herbs instead of table salt or sea salt.  Check with your health care provider or pharmacist before using salt substitutes.  Eat lower-sodium products, often labeled as "lower sodium" or "no salt added."  Eat fresh foods.  Eat more vegetables, fruits, and low-fat dairy products.  Choose whole grains. Look for the word "whole" as the first word in the ingredient list.  Choose fish and skinless chicken or [place] more often than red meat. Limit fish, poultry, and meat to 6 oz (170 g) each day.  Limit sweets, desserts, sugars, and sugary drinks.  Choose heart-healthy fats.  Limit cheese to 1 oz (28 g) per day.  Eat more home-cooked food and less restaurant, buffet, and fast food.  Limit fried foods.  Cook foods using methods other than frying.  Limit canned vegetables. If you do use them, rinse them well to decrease the sodium.  When eating at a restaurant, ask that your food be  prepared with less salt, or no salt if possible. WHAT FOODS CAN I EAT? Seek help from a dietitian for individual calorie needs. Grains Whole grain or whole wheat bread. Brown rice. Whole grain or whole wheat pasta. Quinoa, bulgur, and whole grain cereals. Low-sodium cereals. Corn or whole wheat flour tortillas. Whole grain cornbread. Whole grain crackers. Low-sodium crackers. Vegetables Fresh or frozen vegetables (raw, steamed, roasted, or grilled). Low-sodium or reduced-sodium tomato and vegetable juices. Low-sodium or reduced-sodium tomato sauce and paste. Low-sodium or reduced-sodium canned vegetables.  Fruits All fresh, canned (in natural juice), or frozen fruits. Meat and Other Protein Products Ground beef (85% or leaner), grass-fed beef, or beef trimmed of fat. Skinless chicken or [place]. Ground chicken or [place]. Pork trimmed of fat. All fish and seafood. Eggs. Dried beans, peas, or lentils. Unsalted nuts and seeds. Unsalted canned beans. Dairy Low-fat dairy products, such as skim or 1% milk, 2% or reduced-fat cheeses, low-fat ricotta or cottage cheese, or plain low-fat yogurt. Low-sodium or reduced-sodium cheeses. Fats and Oils Tub margarines without trans fats. Light or reduced-fat mayonnaise and salad dressings (reduced sodium). Avocado. Safflower, olive, or canola oils. Natural peanut or almond butter. Other Unsalted popcorn and pretzels. The items listed above may not be a complete list of recommended foods or beverages. Contact your dietitian for more options. WHAT FOODS ARE NOT RECOMMENDED? Grains White bread. White pasta. White rice. Refined cornbread. Bagels and croissants. Crackers that contain trans fat. Vegetables Creamed or fried  vegetables. Vegetables in a cheese sauce. Regular canned vegetables. Regular canned tomato sauce and paste. Regular tomato and vegetable juices. Fruits Dried fruits. Canned fruit in light or heavy syrup. Fruit juice. Meat and Other Protein  Products Fatty cuts of meat. Ribs, chicken wings, bacon, sausage, bologna, salami, chitterlings, fatback, hot dogs, bratwurst, and packaged luncheon meats. Salted nuts and seeds. Canned beans with salt. Dairy Whole or 2% milk, cream, half-and-half, and cream cheese. Whole-fat or sweetened yogurt. Full-fat cheeses or blue cheese. Nondairy creamers and whipped toppings. Processed cheese, cheese spreads, or cheese curds. Condiments Onion and garlic salt, seasoned salt, table salt, and sea salt. Canned and packaged gravies. Worcestershire sauce. Tartar sauce. Barbecue sauce. Teriyaki sauce. Soy sauce, including reduced sodium. Steak sauce. Fish sauce. Oyster sauce. Cocktail sauce. Horseradish. Ketchup and mustard. Meat flavorings and tenderizers. Bouillon cubes. Hot sauce. Tabasco sauce. Marinades. Taco seasonings. Relishes. Fats and Oils Butter, stick margarine, lard, shortening, ghee, and bacon fat. Coconut, palm kernel, or palm oils. Regular salad dressings. Other Pickles and olives. Salted popcorn and pretzels. The items listed above may not be a complete list of foods and beverages to avoid. Contact your dietitian for more information. WHERE CAN I FIND MORE INFORMATION? National Heart, Lung, and Blood Institute: travelstabloid.com Document Released: 10/15/2011 Document Revised: 03/12/2014 Document Reviewed: 08/30/2013 [facility] Patient Information 2015 [place], [place]. This information is not intended to replace advice given to you by your health care provider. Make sure you discuss any questions you have with your health care provider.

## 2015-06-20 LAB — BASIC METABOLIC PANEL WITH GFR
BUN: 32 mg/dL — ABNORMAL HIGH (ref 7–25)
CHLORIDE: 101 mmol/L (ref 98–110)
CO2: 22 mmol/L (ref 20–31)
Calcium: 9.3 mg/dL (ref 8.6–10.4)
Creat: 1.78 mg/dL — ABNORMAL HIGH (ref 0.60–0.88)
GFR, Est African American: 29 mL/min — ABNORMAL LOW (ref >=60)
GFR, Est Non African American: 25 mL/min — ABNORMAL LOW (ref >=60)
Glucose, Bld: 123 mg/dL — ABNORMAL HIGH (ref 65–99)
POTASSIUM: 4.9 mmol/L (ref 3.5–5.3)
SODIUM: 133 mmol/L — AB (ref 135–146)

## 2015-06-20 LAB — HEPATIC FUNCTION PANEL
ALBUMIN: 4.1 g/dL (ref 3.6–5.1)
ALK PHOS: 62 U/L (ref 33–130)
ALT: 10 U/L (ref 6–29)
AST: 15 U/L (ref 10–35)
BILIRUBIN TOTAL: 0.4 mg/dL (ref 0.2–1.2)
Bilirubin, Direct: <0.1 mg/dL (ref <=0.2)
TOTAL PROTEIN: 7.3 g/dL (ref 6.1–8.1)

## 2015-06-20 LAB — MAGNESIUM: Magnesium: 2.4 mg/dL (ref 1.5–2.5)

## 2015-06-20 LAB — LIPID PANEL
CHOLESTEROL: 328 mg/dL — AB (ref 125–200)
HDL: 48 mg/dL (ref >=46)
LDL Cholesterol: 222 mg/dL — ABNORMAL HIGH (ref <130)
Total CHOL/HDL Ratio: 6.8 Ratio — ABNORMAL HIGH (ref <=5.0)
Triglycerides: 292 mg/dL — ABNORMAL HIGH (ref <150)
VLDL: 58 mg/dL — AB (ref <30)

## 2015-06-20 LAB — TSH: TSH: 1.893 u[IU]/mL (ref 0.350–4.500)

## 2015-06-20 LAB — VITAMIN D 25 HYDROXY (VIT D DEFICIENCY, FRACTURES): Vit D, 25-Hydroxy: 49 ng/mL (ref 30–100)

## 2015-06-20 LAB — INSULIN, FASTING: Insulin fasting, serum: 7.3 u[IU]/mL (ref 2.0–19.6)

## 2015-09-01 ENCOUNTER — Other Ambulatory Visit: Payer: Self-pay | Admitting: Physician Assistant

## 2015-09-20 ENCOUNTER — Ambulatory Visit (INDEPENDENT_AMBULATORY_CARE_PROVIDER_SITE_OTHER): Payer: Medicare Other | Admitting: Physician Assistant

## 2015-09-20 ENCOUNTER — Encounter: Payer: Self-pay | Admitting: Physician Assistant

## 2015-09-20 VITALS — BP 132/60 | HR 78 | Temp 97.7°F | Resp 14 | Ht 58.5 in | Wt 130.0 lb

## 2015-09-20 DIAGNOSIS — I10 Essential (primary) hypertension: Secondary | ICD-10-CM

## 2015-09-20 DIAGNOSIS — Z23 Encounter for immunization: Secondary | ICD-10-CM | POA: Diagnosis not present

## 2015-09-20 DIAGNOSIS — Z79899 Other long term (current) drug therapy: Secondary | ICD-10-CM | POA: Diagnosis not present

## 2015-09-20 DIAGNOSIS — E1121 Type 2 diabetes mellitus with diabetic nephropathy: Secondary | ICD-10-CM

## 2015-09-20 DIAGNOSIS — E559 Vitamin D deficiency, unspecified: Secondary | ICD-10-CM | POA: Diagnosis not present

## 2015-09-20 DIAGNOSIS — E782 Mixed hyperlipidemia: Secondary | ICD-10-CM | POA: Diagnosis not present

## 2015-09-20 LAB — LIPID PANEL
CHOL/HDL RATIO: 5.1 ratio — AB (ref <=5.0)
CHOLESTEROL: 299 mg/dL — AB (ref 125–200)
HDL: 59 mg/dL (ref >=46)
LDL CALC: 186 mg/dL — AB (ref <130)
TRIGLYCERIDES: 272 mg/dL — AB (ref <150)
VLDL: 54 mg/dL — AB (ref <30)

## 2015-09-20 LAB — CBC WITH DIFFERENTIAL/PLATELET
BASOS ABS: 0 10*3/uL (ref 0.0–0.1)
Basophils Relative: 0 % (ref 0–1)
Eosinophils Absolute: 0.1 10*3/uL (ref 0.0–0.7)
Eosinophils Relative: 2 % (ref 0–5)
HEMATOCRIT: 35.9 % — AB (ref 36.0–46.0)
HEMOGLOBIN: 12 g/dL (ref 12.0–15.0)
LYMPHS ABS: 2.4 10*3/uL (ref 0.7–4.0)
LYMPHS PCT: 35 % (ref 12–46)
MCH: 31.2 pg (ref 26.0–34.0)
MCHC: 33.4 g/dL (ref 30.0–36.0)
MCV: 93.2 fL (ref 78.0–100.0)
MPV: 10.9 fL (ref 8.6–12.4)
Monocytes Absolute: 0.7 10*3/uL (ref 0.1–1.0)
Monocytes Relative: 10 % (ref 3–12)
NEUTROS PCT: 53 % (ref 43–77)
Neutro Abs: 3.6 10*3/uL (ref 1.7–7.7)
Platelets: 194 10*3/uL (ref 150–400)
RBC: 3.85 MIL/uL — ABNORMAL LOW (ref 3.87–5.11)
RDW: 13.4 % (ref 11.5–15.5)
WBC: 6.8 10*3/uL (ref 4.0–10.5)

## 2015-09-20 LAB — BASIC METABOLIC PANEL WITH GFR
BUN: 33 mg/dL — AB (ref 7–25)
CO2: 23 mmol/L (ref 20–31)
Calcium: 9.6 mg/dL (ref 8.6–10.4)
Chloride: 100 mmol/L (ref 98–110)
Creat: 1.7 mg/dL — ABNORMAL HIGH (ref 0.60–0.88)
GFR, EST AFRICAN AMERICAN: 31 mL/min — AB (ref >=60)
GFR, Est Non African American: 27 mL/min — ABNORMAL LOW (ref >=60)
GLUCOSE: 122 mg/dL — AB (ref 65–99)
POTASSIUM: 5 mmol/L (ref 3.5–5.3)
Sodium: 135 mmol/L (ref 135–146)

## 2015-09-20 LAB — HEPATIC FUNCTION PANEL
ALBUMIN: 4.3 g/dL (ref 3.6–5.1)
ALK PHOS: 50 U/L (ref 33–130)
ALT: 10 U/L (ref 6–29)
AST: 15 U/L (ref 10–35)
Bilirubin, Direct: 0.1 mg/dL (ref <=0.2)
Indirect Bilirubin: 0.4 mg/dL (ref 0.2–1.2)
TOTAL PROTEIN: 7.2 g/dL (ref 6.1–8.1)
Total Bilirubin: 0.5 mg/dL (ref 0.2–1.2)

## 2015-09-20 LAB — HEMOGLOBIN A1C
Hgb A1c MFr Bld: 6.5 % — ABNORMAL HIGH (ref <5.7)
Mean Plasma Glucose: 140 mg/dL — ABNORMAL HIGH (ref <117)

## 2015-09-20 LAB — TSH: TSH: 1.568 u[IU]/mL (ref 0.350–4.500)

## 2015-09-20 LAB — MAGNESIUM: Magnesium: 2.3 mg/dL (ref 1.5–2.5)

## 2015-09-20 NOTE — Patient Instructions (Signed)
Bad carbs also include fruit juice, alcohol, and sweet tea. These are empty calories that do not signal to your brain that you are full.   Please remember the good carbs are still carbs which convert into sugar. So please measure them out no more than 1/2-1 cup of rice, oatmeal, pasta, and beans  Veggies are however free foods! Pile them on.   Not all fruit is created equal. Please see the list below, the fruit at the bottom is higher in sugars than the fruit at the top. Please avoid all dried fruits.     Monitor your blood pressure at home. Go to the ER if any CP, SOB, nausea, dizziness, severe HA, changes vision/speech  Goal BP:  For patients younger than 60: Goal BP < 140/90. For patients 60 and older: Goal BP < 150/90. For patients with diabetes: Goal BP < 140/90. Your most recent BP: BP: 132/60 mmHg   Take your medications faithfully as instructed. Maintain a healthy weight. Get at least 150 minutes of aerobic exercise per week. Minimize salt intake. Minimize alcohol intake  DASH Eating Plan DASH stands for "Dietary Approaches to Stop Hypertension." The DASH eating plan is a healthy eating plan that has been shown to reduce high blood pressure (hypertension). Additional health benefits may include reducing the risk of type 2 diabetes mellitus, heart disease, and stroke. The DASH eating plan may also help with weight loss. WHAT DO I NEED TO KNOW ABOUT THE DASH EATING PLAN? For the DASH eating plan, you will follow these general guidelines:  Choose foods with a percent daily value for sodium of less than 5% (as listed on the food label).  Use salt-free seasonings or herbs instead of table salt or sea salt.  Check with your health care provider or pharmacist before using salt substitutes.  Eat lower-sodium products, often labeled as "lower sodium" or "no salt added."  Eat fresh foods.  Eat more vegetables, fruits, and low-fat dairy products.  Choose whole grains. Look  for the word "whole" as the first word in the ingredient list.  Choose fish and skinless chicken or [place] more often than red meat. Limit fish, poultry, and meat to 6 oz (170 g) each day.  Limit sweets, desserts, sugars, and sugary drinks.  Choose heart-healthy fats.  Limit cheese to 1 oz (28 g) per day.  Eat more home-cooked food and less restaurant, buffet, and fast food.  Limit fried foods.  Cook foods using methods other than frying.  Limit canned vegetables. If you do use them, rinse them well to decrease the sodium.  When eating at a restaurant, ask that your food be prepared with less salt, or no salt if possible. WHAT FOODS CAN I EAT? Seek help from a dietitian for individual calorie needs. Grains Whole grain or whole wheat bread. Brown rice. Whole grain or whole wheat pasta. Quinoa, bulgur, and whole grain cereals. Low-sodium cereals. Corn or whole wheat flour tortillas. Whole grain cornbread. Whole grain crackers. Low-sodium crackers. Vegetables Fresh or frozen vegetables (raw, steamed, roasted, or grilled). Low-sodium or reduced-sodium tomato and vegetable juices. Low-sodium or reduced-sodium tomato sauce and paste. Low-sodium or reduced-sodium canned vegetables.  Fruits All fresh, canned (in natural juice), or frozen fruits. Meat and Other Protein Products Ground beef (85% or leaner), grass-fed beef, or beef trimmed of fat. Skinless chicken or [place]. Ground chicken or [place]. Pork trimmed of fat. All fish and seafood. Eggs. Dried beans, peas, or lentils. Unsalted nuts and seeds. Unsalted  canned beans. Dairy Low-fat dairy products, such as skim or 1% milk, 2% or reduced-fat cheeses, low-fat ricotta or cottage cheese, or plain low-fat yogurt. Low-sodium or reduced-sodium cheeses. Fats and Oils Tub margarines without trans fats. Light or reduced-fat mayonnaise and salad dressings (reduced sodium). Avocado. Safflower, olive, or canola oils. Natural peanut or almond  butter. Other Unsalted popcorn and pretzels. The items listed above may not be a complete list of recommended foods or beverages. Contact your dietitian for more options. WHAT FOODS ARE NOT RECOMMENDED? Grains White bread. White pasta. White rice. Refined cornbread. Bagels and croissants. Crackers that contain trans fat. Vegetables Creamed or fried vegetables. Vegetables in a cheese sauce. Regular canned vegetables. Regular canned tomato sauce and paste. Regular tomato and vegetable juices. Fruits Dried fruits. Canned fruit in light or heavy syrup. Fruit juice. Meat and Other Protein Products Fatty cuts of meat. Ribs, chicken wings, bacon, sausage, bologna, salami, chitterlings, fatback, hot dogs, bratwurst, and packaged luncheon meats. Salted nuts and seeds. Canned beans with salt. Dairy Whole or 2% milk, cream, half-and-half, and cream cheese. Whole-fat or sweetened yogurt. Full-fat cheeses or blue cheese. Nondairy creamers and whipped toppings. Processed cheese, cheese spreads, or cheese curds. Condiments Onion and garlic salt, seasoned salt, table salt, and sea salt. Canned and packaged gravies. Worcestershire sauce. Tartar sauce. Barbecue sauce. Teriyaki sauce. Soy sauce, including reduced sodium. Steak sauce. Fish sauce. Oyster sauce. Cocktail sauce. Horseradish. Ketchup and mustard. Meat flavorings and tenderizers. Bouillon cubes. Hot sauce. Tabasco sauce. Marinades. Taco seasonings. Relishes. Fats and Oils Butter, stick margarine, lard, shortening, ghee, and bacon fat. Coconut, palm kernel, or palm oils. Regular salad dressings. Other Pickles and olives. Salted popcorn and pretzels. The items listed above may not be a complete list of foods and beverages to avoid. Contact your dietitian for more information. WHERE CAN I FIND MORE INFORMATION? National Heart, Lung, and Blood Institute: travelstabloid.com Document Released: 10/15/2011 Document Revised:  03/12/2014 Document Reviewed: 08/30/2013 [facility] Patient Information 2015 [place], [place]. This information is not intended to replace advice given to you by your health care provider. Make sure you discuss any questions you have with your health care provider.  Please monitor your blood pressure, as we get older our body can not respond to a low blood pressure as well as it did when we were younger, for this reason we want a bit higher of a blood pressure as you get older to avoid dizziness and fatigue which can lead to falls. Pease call if your blood pressure is consistently above 150/90.

## 2015-09-20 NOTE — Progress Notes (Signed)
Assessment and Plan:  1. Hypertension -Continue medication, monitor blood pressure at home. Continue DASH diet.  Reminder to go to the ER if any CP, SOB, nausea, dizziness, severe HA, changes vision/speech, left arm numbness and tingling and jaw pain.  2. Cholesterol -Continue diet and exercise. Check cholesterol.   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 Check A1C  4. Vitamin D Def - check level and continue medications.   Continue diet and meds as discussed. Further disposition pending results of labs. Discussed med's effects and SE's.   Over 30 minutes of exam, counseling, chart review, and critical decision making was performed Future Appointments Date Time Provider Ross  01/02/2016 9:00 AM Unk Pinto, MD [facility] None     HPI 81 y.o. female  presents for 3 month follow up on hypertension, cholesterol, diabetes and vitamin D deficiency.   Her blood pressure has been controlled at home, today her BP is BP: 132/60 mmHg.  She does not workout due to heat, walks with a walker. She lives at [facility] She denies chest pain, shortness of breath, dizziness. She did have a fall the end of June, no LOC, no fracture. Has been walking with walker. She sees ortho, may have surgery next year on right wrist. She is going to visit her sister in [place].   She is not on cholesterol medication and denies myalgias. Her cholesterol is not at goal. The cholesterol was:   Lab Results  Component Value Date   CHOL 328* 06/19/2015   HDL 48 06/19/2015   LDLCALC 222* 06/19/2015   TRIG 292* 06/19/2015   CHOLHDL 6.8* 06/19/2015    She has been working on diet and exercise for diabetes with diabetic chronic kidney disease, she is not on bASA due to easy bruising, she is on ACE/ARB, she does not check her sugars at home, and denies  hypoglycemia , paresthesia of the feet, polydipsia, polyuria and visual disturbances. Last A1C was:  Lab Results   Component Value Date   HGBA1C 6.6* 06/19/2015    Patient is on Vitamin D supplement, 6000 daily. Lab Results  Component Value Date   VD25OH 49 06/19/2015    Current Medications:  Current Outpatient Prescriptions on File Prior to Visit  Medication Sig Dispense Refill  . atenolol (TENORMIN) 50 MG tablet TAKE 1/2 -1 TAB AT BEDTIME FOR BLOOD PRESSURE 90 tablet 1  . Cholecalciferol (VITAMIN D) 2000 UNITS CAPS Take 6,000 Units by mouth daily.    . enalapril (VASOTEC) 20 MG tablet TAKE ONE TABLET BY MOUTH NIGHTLY AT BEDTIME FOR BLOOD PRESSURE 90 tablet 3  . Magnesium 250 MG TABS Take by mouth 2 (two) times daily.    Marland Kitchen triamcinolone ointment (KENALOG) 0.1 % Apply to rash 2-3 x daily as needed 30 g 3   No current facility-administered medications on file prior to visit.   Medical History:  Past Medical History  Diagnosis Date  . HTN (hypertension)     echo (11/11) with EF 55-60%, mild LV hypertrophy, PA systolic pressure 34 mmHg  . Pre-diabetes     diet controlled  . HLD (hyperlipidemia)   . Low back pain   . Arthritis   . Unspecified vitamin D deficiency    Allergies: No Known Allergies   Review of Systems:  Review of Systems  Constitutional: Negative.   HENT: Negative.   Respiratory: Negative.   Cardiovascular: Negative.   Gastrointestinal: Negative.   Genitourinary: Negative.   Musculoskeletal: Positive for  joint pain (right shoulder improving) and falls (x 1 in June, no fractures/LOC). Negative for myalgias, back pain and neck pain.  Skin: Negative.   Neurological: Negative.   Endo/Heme/Allergies: Negative for environmental allergies and polydipsia. Bruises/bleeds easily (on ASA).  Psychiatric/Behavioral: Negative.     Family history- Review and unchanged Social history- Review and unchanged Physical Exam: BP 132/60 mmHg  Pulse 78  Temp(Src) 97.7 F (36.5 C) (Temporal)  Resp 14  Ht 4' 10.5" (1.486 m)  Wt 130 lb (58.968 kg)  BMI 26.70 kg/m2  SpO2 99% Wt  Readings from Last 3 Encounters:  09/20/15 130 lb (58.968 kg)  06/19/15 131 lb 6.4 oz (59.603 kg)  12/19/14 131 lb 6.4 oz (59.603 kg)   General Appearance: Well nourished, in no apparent distress. Eyes: PERRLA, EOMs, conjunctiva no swelling or erythema Sinuses: No Frontal/maxillary tenderness ENT/Mouth: Ext aud canals clear, TMs without erythema, bulging. No erythema, swelling, or exudate on post pharynx.  Tonsils not swollen or erythematous. Hearing normal.  Neck: Supple, thyroid normal.  Respiratory: Respiratory effort normal, BS equal bilaterally without rales, rhonchi, wheezing or stridor.  Cardio: RRR with no MRGs. Brisk peripheral pulses without edema.  Abdomen: Soft, + BS.  Non tender, no guarding, rebound, hernias, masses. Lymphatics: Non tender without lymphadenopathy.  Musculoskeletal: Full ROM, 5/5 strength, Ambulates with walker, slow Skin: Warm, dry without rashes, lesions, ecchymosis.  Neuro: Cranial nerves intact. No cerebellar symptoms.  Psych: Awake and oriented X 3, normal affect, Insight and Judgment appropriate.    Vicie Mutters, PA-C 10:00 AM [facility]

## 2015-09-21 LAB — VITAMIN D 25 HYDROXY (VIT D DEFICIENCY, FRACTURES): Vit D, 25-Hydroxy: 52 ng/mL (ref 30–100)

## 2015-11-29 ENCOUNTER — Other Ambulatory Visit: Payer: Self-pay | Admitting: Internal Medicine

## 2015-12-11 ENCOUNTER — Encounter: Payer: Self-pay | Admitting: Internal Medicine

## 2016-01-02 ENCOUNTER — Ambulatory Visit (INDEPENDENT_AMBULATORY_CARE_PROVIDER_SITE_OTHER): Payer: Medicare Other | Admitting: Internal Medicine

## 2016-01-02 ENCOUNTER — Other Ambulatory Visit: Payer: Self-pay | Admitting: Internal Medicine

## 2016-01-02 ENCOUNTER — Encounter: Payer: Self-pay | Admitting: Internal Medicine

## 2016-01-02 VITALS — BP 140/66 | HR 64 | Temp 97.3°F | Resp 16 | Ht 58.5 in | Wt 131.0 lb

## 2016-01-02 DIAGNOSIS — N183 Chronic kidney disease, stage 3 (moderate): Secondary | ICD-10-CM

## 2016-01-02 DIAGNOSIS — Z0000 Encounter for general adult medical examination without abnormal findings: Secondary | ICD-10-CM

## 2016-01-02 DIAGNOSIS — E559 Vitamin D deficiency, unspecified: Secondary | ICD-10-CM

## 2016-01-02 DIAGNOSIS — Z0001 Encounter for general adult medical examination with abnormal findings: Secondary | ICD-10-CM

## 2016-01-02 DIAGNOSIS — E1122 Type 2 diabetes mellitus with diabetic chronic kidney disease: Secondary | ICD-10-CM

## 2016-01-02 DIAGNOSIS — Z1212 Encounter for screening for malignant neoplasm of rectum: Secondary | ICD-10-CM

## 2016-01-02 DIAGNOSIS — Z1239 Encounter for other screening for malignant neoplasm of breast: Secondary | ICD-10-CM

## 2016-01-02 DIAGNOSIS — I10 Essential (primary) hypertension: Secondary | ICD-10-CM | POA: Insufficient documentation

## 2016-01-02 DIAGNOSIS — Z79899 Other long term (current) drug therapy: Secondary | ICD-10-CM

## 2016-01-02 DIAGNOSIS — E782 Mixed hyperlipidemia: Secondary | ICD-10-CM

## 2016-01-02 LAB — HEPATIC FUNCTION PANEL
ALBUMIN: 4.4 g/dL (ref 3.6–5.1)
ALT: 10 U/L (ref 6–29)
AST: 14 U/L (ref 10–35)
Alkaline Phosphatase: 53 U/L (ref 33–130)
BILIRUBIN DIRECT: 0.1 mg/dL (ref <=0.2)
Indirect Bilirubin: 0.3 mg/dL (ref 0.2–1.2)
TOTAL PROTEIN: 7.5 g/dL (ref 6.1–8.1)
Total Bilirubin: 0.4 mg/dL (ref 0.2–1.2)

## 2016-01-02 LAB — CBC WITH DIFFERENTIAL/PLATELET
BASOS PCT: 0 % (ref 0–1)
Basophils Absolute: 0 10*3/uL (ref 0.0–0.1)
EOS ABS: 0.1 10*3/uL (ref 0.0–0.7)
Eosinophils Relative: 2 % (ref 0–5)
HCT: 35.5 % — ABNORMAL LOW (ref 36.0–46.0)
HEMOGLOBIN: 11.7 g/dL — AB (ref 12.0–15.0)
Lymphocytes Relative: 46 % (ref 12–46)
Lymphs Abs: 3 10*3/uL (ref 0.7–4.0)
MCH: 31 pg (ref 26.0–34.0)
MCHC: 33 g/dL (ref 30.0–36.0)
MCV: 94.2 fL (ref 78.0–100.0)
MONO ABS: 0.6 10*3/uL (ref 0.1–1.0)
MONOS PCT: 9 % (ref 3–12)
MPV: 10.4 fL (ref 8.6–12.4)
Neutro Abs: 2.8 10*3/uL (ref 1.7–7.7)
Neutrophils Relative %: 43 % (ref 43–77)
PLATELETS: 220 10*3/uL (ref 150–400)
RBC: 3.77 MIL/uL — ABNORMAL LOW (ref 3.87–5.11)
RDW: 13.6 % (ref 11.5–15.5)
WBC: 6.5 10*3/uL (ref 4.0–10.5)

## 2016-01-02 LAB — BASIC METABOLIC PANEL WITH GFR
BUN: 38 mg/dL — ABNORMAL HIGH (ref 7–25)
CALCIUM: 9.4 mg/dL (ref 8.6–10.4)
CO2: 22 mmol/L (ref 20–31)
CREATININE: 1.84 mg/dL — AB (ref 0.60–0.88)
Chloride: 103 mmol/L (ref 98–110)
GFR, Est African American: 28 mL/min — ABNORMAL LOW (ref >=60)
GFR, Est Non African American: 24 mL/min — ABNORMAL LOW (ref >=60)
Glucose, Bld: 112 mg/dL — ABNORMAL HIGH (ref 65–99)
Potassium: 4.6 mmol/L (ref 3.5–5.3)
SODIUM: 138 mmol/L (ref 135–146)

## 2016-01-02 LAB — LIPID PANEL
CHOL/HDL RATIO: 5.9 ratio — AB (ref <=5.0)
CHOLESTEROL: 319 mg/dL — AB (ref 125–200)
HDL: 54 mg/dL (ref >=46)
LDL Cholesterol: 230 mg/dL — ABNORMAL HIGH (ref <130)
Triglycerides: 173 mg/dL — ABNORMAL HIGH (ref <150)
VLDL: 35 mg/dL — ABNORMAL HIGH (ref <30)

## 2016-01-02 LAB — MAGNESIUM: Magnesium: 2.3 mg/dL (ref 1.5–2.5)

## 2016-01-02 LAB — TSH: TSH: 1.81 mIU/L

## 2016-01-02 NOTE — Patient Instructions (Signed)
Recommend Adult Low Dose Aspirin or   coated  Aspirin 81 mg daily   To reduce risk of Colon Cancer 20 %,   Skin Cancer 26 % ,   Melanoma 46%   and   Pancreatic cancer 60%   ++++++++++++++++++++++++++++++++++++++++++++++++++++++ Vitamin D goal   is between 70-100.   Please make sure that you are taking your Vitamin D as directed.   It is very important as a natural anti-inflammatory   helping hair, skin, and nails, as well as reducing stroke and heart attack risk.   It helps your bones and helps with mood.  It also decreases numerous cancer risks so please take it as directed.   Low Vit D is associated with a 200-300% higher risk for CANCER   and 200-300% higher risk for HEART   ATTACK  &  STROKE.   .....................................Jeanette Espinoza  It is also associated with higher death rate at younger ages,   autoimmune diseases like Rheumatoid arthritis, Lupus, Multiple Sclerosis.     Also many other serious conditions, like depression, Alzheimer's  Dementia, infertility, muscle aches, fatigue, fibromyalgia - just to name a few.  ++++++++++++++++++++++++++++++++++++++++++++++++  Recommend the book "The END of DIETING" by Dr Excell Seltzer   & the book "The END of DIABETES " by Dr Excell Seltzer  At [email or web address] - get book & Audio CD's     Being diabetic has a  300% increased risk for heart attack, stroke, cancer, and alzheimer- type vascular dementia. It is very important that you work harder with diet by avoiding all foods that are white. Avoid white rice (brown & wild rice is OK), white potatoes (sweetpotatoes in moderation is OK), White bread or wheat bread or anything made out of white flour like bagels, donuts, rolls, buns, biscuits, cakes, pastries, cookies, pizza crust, and pasta (made from white flour & egg whites) - vegetarian pasta or spinach or wheat pasta is OK. Multigrain breads like Arnold's or Pepperidge [employer], or multigrain sandwich thins or flatbreads.  Diet,  exercise and weight loss can reverse and cure diabetes in the early stages.  Diet, exercise and weight loss is very important in the control and prevention of complications of diabetes which affects every system in your body, ie. Brain - dementia/stroke, eyes - glaucoma/blindness, heart - heart attack/heart failure, kidneys - dialysis, stomach - gastric paralysis, intestines - malabsorption, nerves - severe painful neuritis, circulation - gangrene & loss of a leg(s), and finally cancer and Alzheimers.    I recommend avoid fried & greasy foods,  sweets/candy, white rice (brown or wild rice or Quinoa is OK), white potatoes (sweet potatoes are OK) - anything made from white flour - bagels, doughnuts, rolls, buns, biscuits,white and wheat breads, pizza crust and traditional pasta made of white flour & egg white(vegetarian pasta or spinach or wheat pasta is OK).  Multi-grain bread is OK - like multi-grain flat bread or sandwich thins. Avoid alcohol in excess. Exercise is also important.    Eat all the vegetables you want - avoid meat, especially red meat and dairy - especially cheese.  Cheese is the most concentrated form of trans-fats which is the worst thing to clog up our arteries. Veggie cheese is OK which can be found in the fresh produce section at [employer] or [employer] or [employer]  ++++++++++++++++++++++++++++++++++++++++++++++++++ DASH Eating Plan  DASH stands for "Dietary Approaches to Stop Hypertension."   The DASH eating plan is a healthy eating plan that has been shown to reduce high blood  pressure (hypertension). Additional health benefits may include reducing the risk of type 2 diabetes mellitus, heart disease, and stroke. The DASH eating plan may also help with weight loss.  WHAT DO I NEED TO KNOW ABOUT THE DASH EATING PLAN?  For the DASH eating plan, you will follow these general guidelines:  Choose foods with a percent daily value for sodium of less than 5% (as listed on the food  label).  Use salt-free seasonings or herbs instead of table salt or sea salt.  Check with your health care provider or pharmacist before using salt substitutes.  Eat lower-sodium products, often labeled as "lower sodium" or "no salt added."  Eat fresh foods.  Eat more vegetables, fruits, and low-fat dairy products.    Choose whole grains. Look for the word "whole" as the first word in the ingredient list.  Choose fish   Limit sweets, desserts, sugars, and sugary drinks.  Choose heart-healthy fats.  Eat veggie cheese   Eat more home-cooked food and less restaurant, buffet, and fast food.  Limit fried foods.  [place]uffaker foods using methods other than frying.  Limit canned vegetables. If you do use them, rinse them well to decrease the sodium.  When eating at a restaurant, ask that your food be prepared with less salt, or no salt if possible.                      WHAT FOODS CAN I EAT?  Read Dr Fara Olden Fuhrman's books on The End of Dieting & The End of Diabetes  Grains  Whole grain or whole wheat bread. Brown rice. Whole grain or whole wheat pasta. Quinoa, bulgur, and whole grain cereals. Low-sodium cereals. Corn or whole wheat flour tortillas. Whole grain cornbread. Whole grain crackers. Low-sodium crackers.  Vegetables  Fresh or frozen vegetables (raw, steamed, roasted, or grilled). Low-sodium or reduced-sodium tomato and vegetable juices. Low-sodium or reduced-sodium tomato sauce and paste. Low-sodium or reduced-sodium canned vegetables.   Fruits  All fresh, canned (in natural juice), or frozen fruits.  Protein Products   All fish and seafood.  Dried beans, peas, or lentils. Unsalted nuts and seeds. Unsalted canned beans.  Dairy  Low-fat dairy products, such as skim or 1% milk, 2% or reduced-fat cheeses, low-fat ricotta or cottage cheese, or plain low-fat yogurt. Low-sodium or reduced-sodium cheeses.  Fats and Oils  Tub margarines without trans fats. Light or  reduced-fat mayonnaise and salad dressings (reduced sodium). Avocado. Safflower, olive, or canola oils. Natural peanut or almond butter.  Other  Unsalted popcorn and pretzels. The items listed above may not be a complete list of recommended foods or beverages. Contact your dietitian for more options.  +++++++++++++++++++++++++++++++++++++++++++  WHAT FOODS ARE NOT RECOMMENDED?  Grains/ White flour or wheat flour  White bread. White pasta. White rice. Refined cornbread. Bagels and croissants. Crackers that contain trans fat.  Vegetables  Creamed or fried vegetables. Vegetables in a . Regular canned vegetables. Regular canned tomato sauce and paste. Regular tomato and vegetable juices.  Fruits  Dried fruits. Canned fruit in light or heavy syrup. Fruit juice.  Meat and Other Protein Products  Meat in general - RED mwaet & White meat.  Fatty cuts of meat. Ribs, chicken wings, bacon, sausage, bologna, salami, chitterlings, fatback, hot dogs, bratwurst, and packaged luncheon meats.  Dairy  Whole or 2% milk, cream, half-and-half, and cream cheese. Whole-fat or sweetened yogurt. Full-fat cheeses or blue cheese. Nondairy creamers and whipped toppings. Processed cheese, cheese spreads, or  cheese curds.  Condiments  Onion and garlic salt, seasoned salt, table salt, and sea salt. Canned and packaged gravies. Worcestershire sauce. Tartar sauce. Barbecue sauce. Teriyaki sauce. Soy sauce, including reduced sodium. Steak sauce. Fish sauce. Oyster sauce. Cocktail sauce. Horseradish. Ketchup and mustard. Meat flavorings and tenderizers. Bouillon cubes. Hot sauce. Tabasco sauce. Marinades. Taco seasonings. Relishes.  Fats and Oils Butter, stick margarine, lard, shortening and bacon fat. Coconut, palm kernel, or palm oils. Regular salad dressings.  Pickles and olives. Salted popcorn and pretzels.  The items listed above may not be a complete list of foods and beverages to avoid.   Preventive  Care for Adults  A healthy lifestyle and preventive care can promote health and wellness. Preventive health guidelines for women include the following key practices.  A routine yearly physical is a good way to check with your health care provider about your health and preventive screening. It is a chance to share any concerns and updates on your health and to receive a thorough exam.  Visit your dentist for a routine exam and preventive care every 6 months. Brush your teeth twice a day and floss once a day. Good oral hygiene prevents tooth decay and gum disease.  The frequency of eye exams is based on your age, health, family medical history, use of contact lenses, and other factors. Follow your health care provider's recommendations for frequency of eye exams.  Eat a healthy diet. Foods like vegetables, fruits, whole grains, low-fat dairy products, and lean protein foods contain the nutrients you need without too many calories. Decrease your intake of foods high in solid fats, added sugars, and salt. Eat the right amount of calories for you.Get information about a proper diet from your health care provider, if necessary.  Regular physical exercise is one of the most important things you can do for your health. Most adults should get at least 150 minutes of moderate-intensity exercise (any activity that increases your heart rate and causes you to sweat) each week. In addition, most adults need muscle-strengthening exercises on 2 or more days a week.  Maintain a healthy weight. The body mass index (BMI) is a screening tool to identify possible weight problems. It provides an estimate of body fat based on height and weight. Your health care provider can find your BMI and can help you achieve or maintain a healthy weight.For adults 20 years and older:  A BMI below 18.5 is considered underweight.  A BMI of 18.5 to 24.9 is normal.  A BMI of 25 to 29.9 is considered overweight.  A BMI of 30 and  above is considered obese.  Maintain normal blood lipids and cholesterol levels by exercising and minimizing your intake of saturated fat. Eat a balanced diet with plenty of fruit and vegetables. If your lipid or cholesterol levels are high, you are over 50, or you are at high risk for heart disease, you may need your cholesterol levels checked more frequently.Ongoing high lipid and cholesterol levels should be treated with medicines if diet and exercise are not working.  If you smoke, find out from your health care provider how to quit. If you do not use tobacco, do not start.  Lung cancer screening is recommended for adults aged 56-80 years who are at high risk for developing lung cancer because of a history of smoking. A yearly low-dose CT scan of the lungs is recommended for people who have at least a 30-pack-year history of smoking and are a current smoker or  have quit within the past 15 years. A pack year of smoking is smoking an average of 1 pack of cigarettes a day for 1 year (for example: 1 pack a day for 30 years or 2 packs a day for 15 years). Yearly screening should continue until the smoker has stopped smoking for at least 15 years. Yearly screening should be stopped for people who develop a health problem that would prevent them from having lung cancer treatment.  Avoid use of street drugs. Do not share needles with anyone. Ask for help if you need support or instructions about stopping the use of drugs.  High blood pressure causes heart disease and increases the risk of stroke.  Ongoing high blood pressure should be treated with medicines if weight loss and exercise do not work.  If you are 52-67 years old, ask your health care provider if you should take aspirin to prevent strokes.  Diabetes screening involves taking a blood sample to check your fasting blood sugar level. This should be done once every 3 years, after age 9, if you are within normal weight and without risk factors for  diabetes. Testing should be considered at a younger age or be carried out more frequently if you are overweight and have at least 1 risk factor for diabetes.  Breast cancer screening is essential preventive care for women. You should practice "breast self-awareness." This means understanding the normal appearance and feel of your breasts and may include breast self-examination. Any changes detected, no matter how small, should be reported to a health care provider. Women in their 45s and 30s should have a clinical breast exam (CBE) by a health care provider as part of a regular health exam every 1 to 3 years. After age 76, women should have a CBE every year. Starting at age 54, women should consider having a mammogram (breast X-ray test) every year. Women who have a family history of breast cancer should talk to their health care provider about genetic screening. Women at a high risk of breast cancer should talk to their health care providers about having an MRI and a mammogram every year.  Breast cancer gene (BRCA)-related cancer risk assessment is recommended for women who have family members with BRCA-related cancers. BRCA-related cancers include breast, ovarian, tubal, and peritoneal cancers. Having family members with these cancers may be associated with an increased risk for harmful changes (mutations) in the breast cancer genes BRCA1 and BRCA2. Results of the assessment will determine the need for genetic counseling and BRCA1 and BRCA2 testing.  Routine pelvic exams to screen for cancer are no longer recommended for nonpregnant women who are considered low risk for cancer of the pelvic organs (ovaries, uterus, and vagina) and who do not have symptoms. Ask your health care provider if a screening pelvic exam is right for you.  If you have had past treatment for cervical cancer or a condition that could lead to cancer, you need Pap tests and screening for cancer for at least 20 years after your  treatment. If Pap tests have been discontinued, your risk factors (such as having a new sexual partner) need to be reassessed to determine if screening should be resumed. Some women have medical problems that increase the chance of getting cervical cancer. In these cases, your health care provider may recommend more frequent screening and Pap tests.    Colorectal cancer can be detected and often prevented. Most routine colorectal cancer screening begins at the age of 73 years and  continues through age 75 years. However, your health care provider may recommend screening at an earlier age if you have risk factors for colon cancer. On a yearly basis, your health care provider may provide home test kits to check for hidden blood in the stool. Use of a small camera at the end of a tube, to directly examine the colon (sigmoidoscopy or colonoscopy), can detect the earliest forms of colorectal cancer. Talk to your health care provider about this at age 50, when routine screening begins. Direct exam of the colon should be repeated every 5-10 years through age 75 years, unless early forms of pre-cancerous polyps or small growths are found.  Osteoporosis is a disease in which the bones lose minerals and strength with aging. This can result in serious bone fractures or breaks. The risk of osteoporosis can be identified using a bone density scan. Women ages 65 years and over and women at risk for fractures or osteoporosis should discuss screening with their health care providers. Ask your health care provider whether you should take a calcium supplement or vitamin D to reduce the rate of osteoporosis.  Menopause can be associated with physical symptoms and risks. Hormone replacement therapy is available to decrease symptoms and risks. You should talk to your health care provider about whether hormone replacement therapy is right for you.  Use sunscreen. Apply sunscreen liberally and repeatedly throughout the day. You  should seek shade when your shadow is shorter than you. Protect yourself by wearing long sleeves, pants, a wide-brimmed hat, and sunglasses year round, whenever you are outdoors.  Once a month, do a whole body skin exam, using a mirror to look at the skin on your back. Tell your health care provider of new moles, moles that have irregular borders, moles that are larger than a pencil eraser, or moles that have changed in shape or color.  Stay current with required vaccines (immunizations).  Influenza vaccine. All adults should be immunized every year.  Tetanus, diphtheria, and acellular pertussis (Td, Tdap) vaccine. Pregnant women should receive 1 dose of Tdap vaccine during each pregnancy. The dose should be obtained regardless of the length of time since the last dose. Immunization is preferred during the 27th-36th week of gestation. An adult who has not previously received Tdap or who does not know her vaccine status should receive 1 dose of Tdap. This initial dose should be followed by tetanus and diphtheria toxoids (Td) booster doses every 10 years. Adults with an unknown or incomplete history of completing a 3-dose immunization series with Td-containing vaccines should begin or complete a primary immunization series including a Tdap dose. Adults should receive a Td booster every 10 years.    Zoster vaccine. One dose is recommended for adults aged 60 years or older unless certain conditions are present.    Pneumococcal 13-valent conjugate (PCV13) vaccine. When indicated, a person who is uncertain of her immunization history and has no record of immunization should receive the PCV13 vaccine. An adult aged 19 years or older who has certain medical conditions and has not been previously immunized should receive 1 dose of PCV13 vaccine. This PCV13 should be followed with a dose of pneumococcal polysaccharide (PPSV23) vaccine. The PPSV23 vaccine dose should be obtained at least 8 weeks after the dose  of PCV13 vaccine. An adult aged 19 years or older who has certain medical conditions and previously received 1 or more doses of PPSV23 vaccine should receive 1 dose of PCV13. The PCV13 vaccine dose should   be obtained 1 or more years after the last PPSV23 vaccine dose.    Pneumococcal polysaccharide (PPSV23) vaccine. When PCV13 is also indicated, PCV13 should be obtained first. All adults aged 65 years and older should be immunized. An adult younger than age 65 years who has certain medical conditions should be immunized. Any person who resides in a nursing home or long-term care facility should be immunized. An adult smoker should be immunized. People with an immunocompromised condition and certain other conditions should receive both PCV13 and PPSV23 vaccines. People with human immunodeficiency virus (HIV) infection should be immunized as soon as possible after diagnosis. Immunization during chemotherapy or radiation therapy should be avoided. Routine use of PPSV23 vaccine is not recommended for American Indians, [place] Natives, or people younger than 65 years unless there are medical conditions that require PPSV23 vaccine. When indicated, people who have unknown immunization and have no record of immunization should receive PPSV23 vaccine. One-time revaccination 5 years after the first dose of PPSV23 is recommended for people aged 19-64 years who have chronic kidney failure, nephrotic syndrome, asplenia, or immunocompromised conditions. People who received 1-2 doses of PPSV23 before age 65 years should receive another dose of PPSV23 vaccine at age 65 years or later if at least 5 years have passed since the previous dose. Doses of PPSV23 are not needed for people immunized with PPSV23 at or after age 65 years.   Preventive Services / Frequency  Ages 65 years and over  Blood pressure check.  Lipid and cholesterol check.  Lung cancer screening. / Every year if you are aged 55-80 years and have a  30-pack-year history of smoking and currently smoke or have quit within the past 15 years. Yearly screening is stopped once you have quit smoking for at least 15 years or develop a health problem that would prevent you from having lung cancer treatment.  Clinical breast exam.** / Every year after age 40 years.  BRCA-related cancer risk assessment.** / For women who have family members with a BRCA-related cancer (breast, ovarian, tubal, or peritoneal cancers).  Mammogram.** / Every year beginning at age 40 years and continuing for as long as you are in good health. Consult with your health care provider.  Pap test.** / Every 3 years starting at age 30 years through age 65 or 70 years with 3 consecutive normal Pap tests. Testing can be stopped between 65 and 70 years with 3 consecutive normal Pap tests and no abnormal Pap or HPV tests in the past 10 years.  Fecal occult blood test (FOBT) of stool. / Every year beginning at age 50 years and continuing until age 75 years. You may not need to do this test if you get a colonoscopy every 10 years.  Flexible sigmoidoscopy or colonoscopy.** / Every 5 years for a flexible sigmoidoscopy or every 10 years for a colonoscopy beginning at age 50 years and continuing until age 75 years.  Hepatitis C blood test.** / For all people born from 1945 through 1965 and any individual with known risks for hepatitis C.  Osteoporosis screening.** / A one-time screening for women ages 65 years and over and women at risk for fractures or osteoporosis.  Skin self-exam. / Monthly.  Influenza vaccine. / Every year.  Tetanus, diphtheria, and acellular pertussis (Tdap/Td) vaccine.** / 1 dose of Td every 10 years.  Zoster vaccine.** / 1 dose for adults aged 60 years or older.  Pneumococcal 13-valent conjugate (PCV13) vaccine.** / Consult your health care provider.    Pneumococcal polysaccharide (PPSV23) vaccine.** / 1 dose for all adults aged 68 years and older. Screening  for abdominal aortic aneurysm (AAA)  by ultrasound is recommended for people who have history of high blood pressure or who are current or former smokers.

## 2016-01-02 NOTE — Progress Notes (Signed)
Patient ID: Jeanette Espinoza, female   DOB: [birth year], 82 y.o.   MRN: [id number]  Annual Screening/Preventative Visit And Comprehensive Evaluation &  Examination  This very nice 82 y.o.WF presents for a Wellness/Preventative Visit & comprehensive evaluation and management of multiple medical co-morbidities.  Patient has been followed for HTN, T2_NIDDM, Hyperlipidemia and Vitamin D Deficiency.    HTN predates since 2007. Patient's BP has been controlled at home and patient denies any cardiac symptoms as chest pain, palpitations, shortness of breath, dizziness or ankle swelling. Today's BP: 140/66 mmHg    Patient's hyperlipidemia is not controlled with diet. Last lipids were not at goal with Cholesterol 299; HDL 59; LDL 186; and elevated Triglycerides 272 on 09/20/2015.   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 Last A1c was 6.5% on 09/20/2015.   Finally, patient has history of Vitamin D Deficiency and last Vitamin D was 52 on 09/20/2015.    Medication Sig  . atenolol  50 MG tablet TAKE 1/2 -1 TAB AT BEDTIME FOR BLOOD PRESSURE  . VITAMIN D 2000 UNITS  Take 6,000 Units by mouth daily.  . enalapril  20 MG  TAKE ONE TABLET BY MOUTH AT BEDTIME FOR BLOOD PRESSURE  . Magnesium 250 MG  Take by mouth 2 (two) times daily.  Marland Kitchen triamcinolone oint 0.1 % Apply to rash 2-3 x daily as needed   No Known Allergies   Past Medical History  Diagnosis Date  . HTN (hypertension)     echo (11/11) with EF 55-60%, mild LV hypertrophy, PA systolic pressure 34 mmHg  . Pre-diabetes     diet controlled  . HLD (hyperlipidemia)   . Low back pain   . Arthritis   . Unspecified vitamin D deficiency    Health Maintenance  Topic Date Due  . ZOSTAVAX  06/04/1987  . DEXA SCAN  06/03/1992  . OPHTHALMOLOGY EXAM  09/18/2015  . HEMOGLOBIN A1C  03/19/2016  . INFLUENZA VACCINE  06/09/2016   . FOOT EXAM  09/19/2016  . TETANUS/TDAP  09/21/2022  . PNA vac Low Risk Adult  Completed   Immunization History  Administered Date(s) Administered  . Influenza Split 10/11/2013  . Influenza, High Dose Seasonal PF 09/18/2014, 09/20/2015  . Influenza-Unspecified 10/10/2012  . Pneumococcal Conjugate-13 12/19/2014  . Pneumococcal Polysaccharide-23 10/11/2013  . Tdap 09/21/2012   Past Surgical History  Procedure Laterality Date  . Lexiscan myoview  11/11    EF 81%, normal perfusion images suggesting no ischemia or infarction  . Surgery for ddd    . Cataract surgery    . Abnormal ekg    . Back surgery    . Total hip arthroplasty  12/02/2012    Procedure: TOTAL HIP ARTHROPLASTY;  Surgeon: Gearlean Alf, MD;  Location: [facility];  Service: Orthopedics;  Laterality: Right;  . Eye surgery Bilateral     cataract  . Spine surgery      lumbar  . Abdominal hysterectomy      partial  . Joint replacement      left hip replacement , right knee replacement   . Colonoscopsy  2008    Neg  Dr Amedeo Plenty   Family History  Problem Relation Age of Onset  . Diabetes      DM - sibiling   . Coronary artery disease Neg Hx     no premature CAd   . Heart disease  Mother    Social History  Substance Use Topics  . Smoking status: Former Smoker    Quit date: 11/09/1970  . Smokeless tobacco: None  . Alcohol Use: Yes     Comment: occasional     ROS Constitutional: Denies fever, chills, weight loss/gain, headaches, insomnia,  night sweats, and change in appetite. Does c/o fatigue. Eyes: Denies redness, blurred vision, diplopia, discharge, itchy, watery eyes.  ENT: Denies discharge, congestion, post nasal drip, epistaxis, sore throat, earache, hearing loss, dental pain, Tinnitus, Vertigo, Sinus pain, snoring.  Cardio: Denies chest pain, palpitations, irregular heartbeat, syncope, dyspnea, diaphoresis, orthopnea, PND, claudication, edema Respiratory: denies cough, dyspnea, DOE, pleurisy, hoarseness,  laryngitis, wheezing.  Gastrointestinal: Denies dysphagia, heartburn, reflux, water brash, pain, cramps, nausea, vomiting, bloating, diarrhea, constipation, hematemesis, melena, hematochezia, jaundice, hemorrhoids Genitourinary: Denies dysuria, frequency, urgency, nocturia, hesitancy, discharge, hematuria, flank pain Breast: Breast lumps, nipple discharge, bleeding.  Musculoskeletal: Denies arthralgia, myalgia, stiffness, Jt. Swelling, pain, limp, and strain/sprain. Denies falls. Skin: Denies puritis, rash, hives, warts, acne, eczema, changing in skin lesion Neuro: No weakness, tremor, incoordination, spasms, paresthesia, pain Psychiatric: Denies confusion, memory loss, sensory loss. Denies Depression. Endocrine: Denies change in weight, skin, hair change, nocturia, and paresthesia, diabetic polys, visual blurring, hyper / hypo glycemic episodes.  Heme/Lymph: No excessive bleeding, bruising, enlarged lymph nodes.  Physical Exam  BP 140/66 mmHg  Pulse 64  Temp(Src) 97.3 F (36.3 C)  Resp 16  Ht 4' 10.5" (1.486 m)  Wt 131 lb (59.421 kg)  BMI 26.91 kg/m2  General Appearance: Well nourished and in no apparent distress. Eyes: PERRLA, EOMs, conjunctiva no swelling or erythema, normal fundi and vessels. Sinuses: No frontal/maxillary tenderness ENT/Mouth: EACs patent / TMs  nl. Nares clear without erythema, swelling, mucoid exudates. Oral hygiene is good. No erythema, swelling, or exudate. Tongue normal, non-obstructing. Tonsils not swollen or erythematous. Hearing normal.  Neck: Supple, thyroid normal. No bruits, nodes or JVD. Respiratory: Respiratory effort normal.  BS equal and clear bilateral without rales, rhonci, wheezing or stridor. Cardio: Heart sounds are normal with regular rate and rhythm and no murmurs, rubs or gallops. Peripheral pulses are normal and equal bilaterally without edema. No aortic or femoral bruits. Chest: symmetric with normal excursions and percussion. Breasts:  Symmetric, without lumps, nipple discharge, retractions, or fibrocystic changes.  Abdomen: Flat, soft, with bowl sounds. Nontender, no guarding, rebound, hernias, masses, or organomegaly.  Lymphatics: Non tender without lymphadenopathy.  Genitourinary:  Musculoskeletal: Full ROM all peripheral extremities, joint stability, 5/5 strength, and normal gait. Skin: Warm and dry without rashes, lesions, cyanosis, clubbing or  ecchymosis.  Neuro: Cranial nerves intact, reflexes equal bilaterally. Normal muscle tone, no cerebellar symptoms. Sensation intact.  Pysch: Alert and oriented X 3, normal affect, Insight and Judgment appropriate.   Assessment and Plan  1. Annual Preventative Screening Examination     2. Essential hypertension  - Microalbumin / creatinine urine ratio - EKG 12-Lead - [place], RETROPERITNL ABD,  LTD - TSH  3. Hyperlipidemia  - Lipid panel - TSH  4. Type 2 diabetes mellitus with stage 3 chronic kidney disease, without long-term current use of insulin (HCC)  - Hemoglobin A1c - Insulin, random  5. Vitamin D deficiency  - VITAMIN D 25 Hydroxy   6. Screening for rectal cancer  - POC Hemoccult Bld/Stl (  7. Medication management  - Urinalysis, Routine w reflex microscopic  - CBC with Differential/Platelet - BASIC METABOLIC PANEL WITH GFR - Hepatic function panel - Magnesium   Continue prudent diet  as discussed, weight control, BP monitoring, regular exercise, and medications. Discussed med's effects and SE's. Screening labs and tests as requested with regular follow-up as recommended. Over 40 minutes of exam, counseling, chart review and high complex critical decision making was performed.

## 2016-01-03 ENCOUNTER — Other Ambulatory Visit: Payer: Self-pay | Admitting: Internal Medicine

## 2016-01-03 DIAGNOSIS — Z1231 Encounter for screening mammogram for malignant neoplasm of breast: Secondary | ICD-10-CM

## 2016-01-03 LAB — URINALYSIS, ROUTINE W REFLEX MICROSCOPIC
BILIRUBIN URINE: NEGATIVE
GLUCOSE, UA: NEGATIVE
Hgb urine dipstick: NEGATIVE
KETONES UR: NEGATIVE
Nitrite: NEGATIVE
PROTEIN: NEGATIVE
Specific Gravity, Urine: 1.014 (ref 1.001–1.035)
pH: 6 (ref 5.0–8.0)

## 2016-01-03 LAB — URINALYSIS, MICROSCOPIC ONLY
Bacteria, UA: NONE SEEN [HPF]
Casts: NONE SEEN [LPF]
Crystals: NONE SEEN [HPF]
Yeast: NONE SEEN [HPF]

## 2016-01-03 LAB — MICROALBUMIN / CREATININE URINE RATIO
CREATININE, URINE: 61 mg/dL (ref 20–320)
MICROALB/CREAT RATIO: 43 ug/mg{creat} — AB (ref <30)
Microalb, Ur: 2.6 mg/dL

## 2016-01-03 LAB — VITAMIN D 25 HYDROXY (VIT D DEFICIENCY, FRACTURES): VIT D 25 HYDROXY: 37 ng/mL (ref 30–100)

## 2016-01-03 LAB — HEMOGLOBIN A1C
HEMOGLOBIN A1C: 6.4 % — AB (ref <5.7)
MEAN PLASMA GLUCOSE: 137 mg/dL — AB (ref <117)

## 2016-01-03 LAB — INSULIN, RANDOM: INSULIN: 10.4 u[IU]/mL (ref 2.0–19.6)

## 2016-01-21 ENCOUNTER — Ambulatory Visit
Admission: RE | Admit: 2016-01-21 | Discharge: 2016-01-21 | Disposition: A | Discharge status: Home / Self Care | Payer: Medicare Other | Source: Ambulatory Visit | Attending: Internal Medicine | Admitting: Internal Medicine

## 2016-01-21 DIAGNOSIS — Z1231 Encounter for screening mammogram for malignant neoplasm of breast: Secondary | ICD-10-CM

## 2016-03-05 ENCOUNTER — Other Ambulatory Visit: Payer: Self-pay | Admitting: Internal Medicine

## 2016-04-08 ENCOUNTER — Encounter: Payer: Self-pay | Admitting: Internal Medicine

## 2016-04-08 ENCOUNTER — Ambulatory Visit (INDEPENDENT_AMBULATORY_CARE_PROVIDER_SITE_OTHER): Payer: Medicare Other | Admitting: Internal Medicine

## 2016-04-08 VITALS — BP 140/72 | HR 68 | Temp 98.0°F | Resp 16 | Ht 58.5 in | Wt 129.0 lb

## 2016-04-08 DIAGNOSIS — N183 Chronic kidney disease, stage 3 (moderate): Secondary | ICD-10-CM

## 2016-04-08 DIAGNOSIS — Z0000 Encounter for general adult medical examination without abnormal findings: Secondary | ICD-10-CM

## 2016-04-08 DIAGNOSIS — R6889 Other general symptoms and signs: Secondary | ICD-10-CM | POA: Diagnosis not present

## 2016-04-08 DIAGNOSIS — E559 Vitamin D deficiency, unspecified: Secondary | ICD-10-CM | POA: Diagnosis not present

## 2016-04-08 DIAGNOSIS — E782 Mixed hyperlipidemia: Secondary | ICD-10-CM | POA: Diagnosis not present

## 2016-04-08 DIAGNOSIS — E1122 Type 2 diabetes mellitus with diabetic chronic kidney disease: Secondary | ICD-10-CM

## 2016-04-08 DIAGNOSIS — I10 Essential (primary) hypertension: Secondary | ICD-10-CM

## 2016-04-08 DIAGNOSIS — Z0001 Encounter for general adult medical examination with abnormal findings: Secondary | ICD-10-CM | POA: Diagnosis not present

## 2016-04-08 DIAGNOSIS — Z79899 Other long term (current) drug therapy: Secondary | ICD-10-CM

## 2016-04-08 DIAGNOSIS — M160 Bilateral primary osteoarthritis of hip: Secondary | ICD-10-CM

## 2016-04-08 LAB — BASIC METABOLIC PANEL WITH GFR
BUN: 34 mg/dL — ABNORMAL HIGH (ref 7–25)
CHLORIDE: 102 mmol/L (ref 98–110)
CO2: 23 mmol/L (ref 20–31)
Calcium: 9.6 mg/dL (ref 8.6–10.4)
Creat: 1.69 mg/dL — ABNORMAL HIGH (ref 0.60–0.88)
GFR, EST NON AFRICAN AMERICAN: 27 mL/min — AB (ref >=60)
GFR, Est African American: 31 mL/min — ABNORMAL LOW (ref >=60)
GLUCOSE: 119 mg/dL — AB (ref 65–99)
POTASSIUM: 4.8 mmol/L (ref 3.5–5.3)
Sodium: 137 mmol/L (ref 135–146)

## 2016-04-08 LAB — HEPATIC FUNCTION PANEL
ALK PHOS: 60 U/L (ref 33–130)
ALT: 10 U/L (ref 6–29)
AST: 13 U/L (ref 10–35)
Albumin: 4.3 g/dL (ref 3.6–5.1)
BILIRUBIN INDIRECT: 0.3 mg/dL (ref 0.2–1.2)
Bilirubin, Direct: 0.1 mg/dL (ref <=0.2)
TOTAL PROTEIN: 7.2 g/dL (ref 6.1–8.1)
Total Bilirubin: 0.4 mg/dL (ref 0.2–1.2)

## 2016-04-08 LAB — LIPID PANEL
Cholesterol: 308 mg/dL — ABNORMAL HIGH (ref 125–200)
HDL: 51 mg/dL (ref >=46)
Total CHOL/HDL Ratio: 6 Ratio — ABNORMAL HIGH (ref <=5.0)
Triglycerides: 419 mg/dL — ABNORMAL HIGH (ref <150)

## 2016-04-08 LAB — CBC WITH DIFFERENTIAL/PLATELET
BASOS ABS: 0 {cells}/uL (ref 0–200)
Basophils Relative: 0 %
EOS PCT: 2 %
Eosinophils Absolute: 134 cells/uL (ref 15–500)
HEMATOCRIT: 35.4 % (ref 35.0–45.0)
HEMOGLOBIN: 12 g/dL (ref 11.7–15.5)
LYMPHS ABS: 3015 {cells}/uL (ref 850–3900)
Lymphocytes Relative: 45 %
MCH: 31.3 pg (ref 27.0–33.0)
MCHC: 33.9 g/dL (ref 32.0–36.0)
MCV: 92.2 fL (ref 80.0–100.0)
MONO ABS: 670 {cells}/uL (ref 200–950)
MPV: 11 fL (ref 7.5–12.5)
Monocytes Relative: 10 %
NEUTROS ABS: 2881 {cells}/uL (ref 1500–7800)
NEUTROS PCT: 43 %
Platelets: 210 10*3/uL (ref 140–400)
RBC: 3.84 MIL/uL (ref 3.80–5.10)
RDW: 13.3 % (ref 11.0–15.0)
WBC: 6.7 10*3/uL (ref 3.8–10.8)

## 2016-04-08 LAB — HEMOGLOBIN A1C
HEMOGLOBIN A1C: 7.1 % — AB (ref <5.7)
Mean Plasma Glucose: 157 mg/dL

## 2016-04-08 LAB — TSH: TSH: 1.69 m[IU]/L

## 2016-04-08 MED ORDER — ENALAPRIL MALEATE 20 MG PO TABS: 20.0000 mg | ORAL_TABLET | Freq: Every day | ORAL | Status: DC

## 2016-04-08 MED ORDER — TRIAMCINOLONE ACETONIDE 0.1 % EX OINT: TOPICAL_OINTMENT | CUTANEOUS | Status: DC

## 2016-04-08 NOTE — Progress Notes (Signed)
MEDICARE ANNUAL WELLNESS VISIT AND FOLLOW UP  Assessment:    1. Essential hypertension -recommended atenolol in PM and enalarpil in AM -DASH diet -cont exercise as tolerated - TSH  2. Type 2 diabetes mellitus with stage 3 chronic kidney disease, without long-term current use of insulin (HCC) -cont diet and exercise -UTD with eye exams - Hemoglobin A1c  3. Hyperlipidemia -cont diet and exercise -given age is not a candidate for statins - Lipid panel  4. Vitamin D deficiency -cont Vit D supplement  5. Medication management  - CBC with Differential/Platelet - BASIC METABOLIC PANEL WITH GFR - Hepatic function panel  6. Primary osteoarthritis of both hips -cont movement and exercise as tolerated -1 hip has been replaced.  7. Medicare annual wellness visit, subsequent -due next year.     Over 30 minutes of exam, counseling, chart review, and critical decision making was performed Future Appointments Date Time Provider [place]  07/10/2016 9:45 AM Unk Pinto, MD [facility] None  01/21/2017 9:00 AM Unk Pinto, MD [facility] None     Plan:   During the course of the visit the patient was educated and counseled about appropriate screening and preventive services including:    Pneumococcal vaccine   Influenza vaccine  Td vaccine  Prevnar 13  Screening electrocardiogram  Screening mammography  Bone densitometry screening  Colorectal cancer screening  Diabetes screening  Glaucoma screening  Nutrition counseling   Advanced directives: given info/requested copies  Conditions/risks identified: Diabetes is at goal, ACE/ARB therapy: Yes. Urinary Incontinence is an issue: discussed non pharmacology and pharmacology options.  Fall risk: low- discussed PT, home fall assessment, medications.    Subjective:   Jeanette Espinoza is a 82 y.o. female who presents for Medicare Annual Wellness Visit and 3 month follow up on hypertension,  prediabetes, hyperlipidemia, vitamin D def.  Date of last medicare wellness visit was   Her blood pressure has been controlled at home, today their BP is BP: 140/72 mmHg She does not workout. She denies chest pain, shortness of breath, dizziness.  She notes that she is trying to walk some.  She does walk around her retirement community.    She is not on cholesterol medication and denies myalgias. Her cholesterol is not at goal. The cholesterol last visit was:   Lab Results  Component Value Date   CHOL 319* 01/02/2016   HDL 54 01/02/2016   LDLCALC 230* 01/02/2016   TRIG 173* 01/02/2016   CHOLHDL 5.9* 01/02/2016   She does have prediabetes which is controlled with diet and exercise.  She notes that she is asymptomatic. Lab Results  Component Value Date   HGBA1C 6.4* 01/02/2016    Last GFR Lab Results  Component Value Date   GFRNONAA 24* 01/02/2016   Lab Results  Component Value Date   GFRAA 28* 01/02/2016   Patient is on Vitamin D supplement. Lab Results  Component Value Date   VD25OH 37 01/02/2016     She notes that  Medication Review Current Outpatient Prescriptions on File Prior to Visit  Medication Sig Dispense Refill  . atenolol (TENORMIN) 50 MG tablet TAKE 1/2 -1 TAB AT BEDTIME FOR BLOOD PRESSURE 90 tablet 1  . Cholecalciferol (VITAMIN D) 2000 UNITS CAPS Take 6,000 Units by mouth daily.    . Magnesium 250 MG TABS Take by mouth 2 (two) times daily.     No current facility-administered medications on file prior to visit.    Current Problems (verified) Patient Active Problem List  Diagnosis Date Noted  . HTN (hypertension) 01/02/2016  . Medication management 02/06/2014  . 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 Hyperlipidemia   . Vitamin D deficiency   . OA (osteoarthritis) of hip 12/02/2012    Screening Tests Immunization History  Administered Date(s) Administered  . Influenza Split 10/11/2013  . Influenza, High Dose Seasonal PF 09/18/2014,  09/20/2015  . Influenza-Unspecified 10/10/2012  . Pneumococcal Conjugate-13 12/19/2014  . Pneumococcal Polysaccharide-23 10/11/2013  . Tdap 09/21/2012    Preventative care: Last colonoscopy: 2008 Last mammogram: 2017  Prior vaccinations: TD or Tdap: 2013  Influenza: 2016  Pneumococcal: 2014 Prevnar13: 2016 Shingles/Zostavax: Declined  Names of Other Physician/Practitioners you currently use: 1. [facility]- here for primary care 2. Dr. Sabra Heck, eye doctor, last visit 2017 3. Dr. Lavone Neri , dentist, last visit 2017 Patient Care Team: Unk Pinto, MD as PCP - General (Internal Medicine) Gaynelle Arabian, MD as Consulting Physician (Orthopedic Surgery) Harrie Foreman, [place] as Referring Physician (Optometry) Teena Irani, MD as Consulting Physician (Gastroenterology) Larey Dresser, MD as Consulting Physician (Cardiology) Suella Broad, MD as Consulting Physician (Physical Medicine and Rehabilitation)  Past Surgical History  Procedure Laterality Date  . Lexiscan myoview  11/11    EF 81%, normal perfusion images suggesting no ischemia or infarction  . Surgery for ddd    . Cataract surgery    . Abnormal ekg    . Back surgery    . Total hip arthroplasty  12/02/2012    Procedure: TOTAL HIP ARTHROPLASTY;  Surgeon: Gearlean Alf, MD;  Location: [facility];  Service: Orthopedics;  Laterality: Right;  . Eye surgery Bilateral     cataract  . Spine surgery      lumbar  . Abdominal hysterectomy      partial  . Joint replacement      left hip replacement , right knee replacement   . Colonoscopsy  2008    Neg  Dr Amedeo Plenty   Family History  Problem Relation Age of Onset  . Diabetes      DM - sibiling   . Coronary artery disease Neg Hx     no premature CAd   . Heart disease Mother    Social History  Substance Use Topics  . Smoking status: Former Smoker    Quit date: 11/09/1970  . Smokeless tobacco: None  . Alcohol Use: Yes     Comment: occasional     No Known Allergies  MEDICARE WELLNESS OBJECTIVES: Tobacco use: She does not smoke.  Patient is a former smoker. If yes, counseling given Alcohol Current alcohol use: social drinker Diet: well balanced Physical activity: Current Exercise Habits: Home exercise routine, Type of exercise: walking, Time (Minutes): 20 Cardiac risk factors: Cardiac Risk Factors include: advanced age (>61men, >18 women);dyslipidemia;hypertension Depression/mood screen:   Depression screen [facility] 2/9 04/08/2016  Decreased Interest 0  Down, Depressed, Hopeless 0  PHQ - 2 Score 0    ADLs:  In your present state of health, do you have any difficulty performing the following activities: 04/08/2016  Hearing? Y  Vision? N  Difficulty concentrating or making decisions? N  Walking or climbing stairs? N  Dressing or bathing? N  Doing errands, shopping? N  Preparing Food and eating ? N  Using the Toilet? N  In the past six months, have you accidently leaked urine? N  Do you have problems with loss of bowel control? N  Managing your Medications? N  Managing your Finances? N  Housekeeping or managing your Housekeeping? N     Cognitive Testing  Alert? Yes  Normal Appearance?Yes  Oriented to person? Yes  Place? Yes   Time? Yes  Recall of three objects?  Yes  Can perform simple calculations? Yes  Displays appropriate judgment?Yes  Can read the correct time from a watch face?Yes  EOL planning: Does patient have an advance directive?: Yes Type of Advance Directive: Healthcare Power of Attorney, Living will Does patient want to make changes to advanced directive?: No - Patient declined Copy of advanced directive(s) in chart?: No - copy requested   Objective:   Today's Vitals   04/08/16 0920  BP: 140/72  Pulse: 68  Temp: 98 F (36.7 C)  TempSrc: Temporal  Resp: 16  Height: 4' 10.5" (1.486 m)  Weight: 129 lb (58.514 kg)   Body mass index is 26.5 kg/(m^2).  General appearance: alert, no distress,  WD/WN,  female HEENT: normocephalic, sclerae anicteric, TMs pearly, nares patent, no discharge or erythema, pharynx normal Oral cavity: MMM, no lesions Neck: supple, no lymphadenopathy, no thyromegaly, no masses Heart: RRR, normal S1, S2, no murmurs Lungs: CTA bilaterally, no wheezes, rhonchi, or rales Abdomen: +bs, soft, non tender, non distended, no masses, no hepatomegaly, no splenomegaly Musculoskeletal: nontender, no swelling, no obvious deformity Extremities: no edema, no cyanosis, no clubbing Pulses: 2+ symmetric, upper and lower extremities, normal cap refill Neurological: alert, oriented x 3, CN2-12 intact, strength normal upper extremities and lower extremities, sensation normal throughout, DTRs 2+ throughout, no cerebellar signs, gait normal with walker Psychiatric: normal affect, behavior normal, pleasant  Breast: defer Gyn: defer Rectal: defer   Medicare Attestation I have personally reviewed: The patient's medical and social history Their use of alcohol, tobacco or illicit drugs Their current medications and supplements The patient's functional ability including ADLs,fall risks, home safety risks, cognitive, and hearing and visual impairment Diet and physical activities Evidence for depression or mood disorders  The patient's weight, height, BMI, and visual acuity have been recorded in the chart.  I have made referrals, counseling, and provided education to the patient based on review of the above and I have provided the patient with a written personalized care plan for preventive services.     Starlyn Skeans, PA-C   04/08/2016

## 2016-06-29 ENCOUNTER — Encounter: Payer: Self-pay | Admitting: Internal Medicine

## 2016-07-10 ENCOUNTER — Ambulatory Visit (INDEPENDENT_AMBULATORY_CARE_PROVIDER_SITE_OTHER): Payer: Medicare Other | Admitting: Internal Medicine

## 2016-07-10 VITALS — BP 130/70 | HR 72 | Temp 97.3°F | Resp 16 | Ht 58.5 in | Wt 131.2 lb

## 2016-07-10 DIAGNOSIS — E559 Vitamin D deficiency, unspecified: Secondary | ICD-10-CM | POA: Diagnosis not present

## 2016-07-10 DIAGNOSIS — E782 Mixed hyperlipidemia: Secondary | ICD-10-CM | POA: Diagnosis not present

## 2016-07-10 DIAGNOSIS — Z79899 Other long term (current) drug therapy: Secondary | ICD-10-CM

## 2016-07-10 DIAGNOSIS — N183 Chronic kidney disease, stage 3 unspecified: Secondary | ICD-10-CM

## 2016-07-10 DIAGNOSIS — I10 Essential (primary) hypertension: Secondary | ICD-10-CM | POA: Diagnosis not present

## 2016-07-10 DIAGNOSIS — E1122 Type 2 diabetes mellitus with diabetic chronic kidney disease: Secondary | ICD-10-CM | POA: Diagnosis not present

## 2016-07-10 LAB — CBC WITH DIFFERENTIAL/PLATELET
Basophils Absolute: 0 cells/uL (ref 0–200)
Basophils Relative: 0 %
EOS PCT: 5 %
Eosinophils Absolute: 325 cells/uL (ref 15–500)
HEMATOCRIT: 33.8 % — AB (ref 35.0–45.0)
Hemoglobin: 11.4 g/dL — ABNORMAL LOW (ref 11.7–15.5)
LYMPHS PCT: 41 %
Lymphs Abs: 2665 cells/uL (ref 850–3900)
MCH: 31.3 pg (ref 27.0–33.0)
MCHC: 33.7 g/dL (ref 32.0–36.0)
MCV: 92.9 fL (ref 80.0–100.0)
MONO ABS: 455 {cells}/uL (ref 200–950)
MONOS PCT: 7 %
MPV: 10.5 fL (ref 7.5–12.5)
NEUTROS PCT: 47 %
Neutro Abs: 3055 cells/uL (ref 1500–7800)
PLATELETS: 198 10*3/uL (ref 140–400)
RBC: 3.64 MIL/uL — AB (ref 3.80–5.10)
RDW: 13.2 % (ref 11.0–15.0)
WBC: 6.5 10*3/uL (ref 3.8–10.8)

## 2016-07-10 LAB — HEPATIC FUNCTION PANEL
ALBUMIN: 4.3 g/dL (ref 3.6–5.1)
ALT: 9 U/L (ref 6–29)
AST: 15 U/L (ref 10–35)
Alkaline Phosphatase: 47 U/L (ref 33–130)
BILIRUBIN INDIRECT: 0.4 mg/dL (ref 0.2–1.2)
BILIRUBIN TOTAL: 0.4 mg/dL (ref 0.2–1.2)
Bilirubin, Direct: 0 mg/dL (ref <=0.2)
TOTAL PROTEIN: 7.1 g/dL (ref 6.1–8.1)

## 2016-07-10 LAB — LIPID PANEL
CHOLESTEROL: 292 mg/dL — AB (ref 125–200)
HDL: 61 mg/dL (ref >=46)
LDL Cholesterol: 183 mg/dL — ABNORMAL HIGH (ref <130)
TRIGLYCERIDES: 241 mg/dL — AB (ref <150)
Total CHOL/HDL Ratio: 4.8 Ratio (ref <=5.0)
VLDL: 48 mg/dL — ABNORMAL HIGH (ref <30)

## 2016-07-10 LAB — BASIC METABOLIC PANEL WITH GFR
BUN: 40 mg/dL — AB (ref 7–25)
CALCIUM: 9.5 mg/dL (ref 8.6–10.4)
CO2: 22 mmol/L (ref 20–31)
Chloride: 101 mmol/L (ref 98–110)
Creat: 1.95 mg/dL — ABNORMAL HIGH (ref 0.60–0.88)
GFR, EST AFRICAN AMERICAN: 26 mL/min — AB (ref >=60)
GFR, EST NON AFRICAN AMERICAN: 22 mL/min — AB (ref >=60)
GLUCOSE: 108 mg/dL — AB (ref 65–99)
Potassium: 4.9 mmol/L (ref 3.5–5.3)
Sodium: 134 mmol/L — ABNORMAL LOW (ref 135–146)

## 2016-07-10 LAB — TSH: TSH: 1.43 mIU/L

## 2016-07-10 LAB — MAGNESIUM: Magnesium: 2.3 mg/dL (ref 1.5–2.5)

## 2016-07-10 NOTE — Progress Notes (Signed)
[facility]   Unk Pinto, M.D.    Uvaldo Bristle. Silverio Lay, P.A.-C      Starlyn Skeans, P.A.-C  [employer]                [address] [place] [id number] Telephone [phone] Telefax [phone] ___________________________________________________________________________     This very nice 82y.o.WWF presents for evaluation and management of multiple medical co-morbidities.  Patient has been followed for HTN, T2_NIDDM, Hyperlipidemia and Vitamin D Deficiency.     HTN predates circa 2007. Patient's BP has been controlled at home.Today's BP: 130/70. Patient denies any cardiac symptoms as chest pain, palpitations, shortness of breath, dizziness or ankle swelling.     Patient's hyperlipidemia is not controlled with diet and age precludes aggressive pharmacologic intervention. Patient denies myalgias or other medication SE's. Last lipids were not at goal: Lab Results  Component Value Date   CHOL 292 (H) 07/10/2016   HDL 61 07/10/2016   LDLCALC 183 (H) 07/10/2016   TRIG 241 (H) 07/10/2016   CHOLHDL 4.8 07/10/2016      Patient has T2_NIDDM circa 2016 with A1c 6.5% and patient is attempting dietary management and patient denies reactive hypoglycemic symptoms, visual blurring, diabetic polys or paresthesias. Last A1c was  Lab Results  Component Value Date   HGBA1C 6.3 (H) 07/10/2016       Finally, patient has history of Vitamin D Deficiency and last vitamin D was at goal:  Lab Results  Component Value Date   VD25OH 68 07/10/2016   Current Outpatient Prescriptions on File Prior to Visit  Medication Sig  . atenolol (TENORMIN) 50 MG tablet TAKE 1/2 -1 TAB AT BEDTIME FOR BLOOD PRESSURE  . Cholecalciferol (VITAMIN D) 2000 UNITS CAPS Take 6,000 Units by mouth daily.  . enalapril (VASOTEC) 20 MG tablet Take 1 tablet (20 mg total) by mouth daily.  . Magnesium 250 MG TABS Take by mouth 2 (two) times  daily.  Marland Kitchen triamcinolone ointment (KENALOG) 0.1 % Apply to rash 2-3 x daily as needed   No Known Allergies Past Medical History:  Diagnosis Date  . Arthritis   . HLD (hyperlipidemia)   . HTN (hypertension)    echo (11/11) with EF 55-60%, mild LV hypertrophy, PA systolic pressure 34 mmHg  . Low back pain   . Pre-diabetes    diet controlled  . Unspecified vitamin D deficiency    Health Maintenance  Topic Date Due  . OPHTHALMOLOGY EXAM  09/18/2015  . INFLUENZA VACCINE  06/09/2016  . ZOSTAVAX  05/13/2018 (Originally 06/04/1987)  . DEXA SCAN  05/10/2019 (Originally 06/03/1992)  . FOOT EXAM  09/19/2016  . HEMOGLOBIN A1C  01/07/2017  . TETANUS/TDAP  09/21/2022  . PNA vac Low Risk Adult  Completed   Immunization History  Administered Date(s) Administered  . Influenza Split 10/11/2013  . Influenza, High Dose Seasonal PF 09/18/2014, 09/20/2015  . Influenza-Unspecified 10/10/2012  . Pneumococcal Conjugate-13 12/19/2014  . Pneumococcal Polysaccharide-23 10/11/2013  . Tdap 09/21/2012   Past Surgical History:  Procedure Laterality Date  . ABDOMINAL HYSTERECTOMY     partial  . abnormal EKG    . BACK SURGERY    . cataract surgery    . colonoscopsy  2008   Neg  Dr Amedeo Plenty  . EYE SURGERY Bilateral    cataract  . JOINT REPLACEMENT     left hip replacement , right  knee replacement   . lexiscan myoview  11/11   EF 81%, normal perfusion images suggesting no ischemia or infarction  . SPINE SURGERY     lumbar  . surgery for DDD    . TOTAL HIP ARTHROPLASTY  12/02/2012   Procedure: TOTAL HIP ARTHROPLASTY;  Surgeon: Gearlean Alf, MD;  Location: [facility];  Service: Orthopedics;  Laterality: Right;   Family History  Problem Relation Age of Onset  . Diabetes      DM - sibiling   . Coronary artery disease Neg Hx     no premature CAd   . Heart disease Mother    Social History   Social History  . Marital status: Single    Spouse name: N/A  . Number of children: N/A  . Years of  education: N/A   Social History Main Topics  . Smoking status: Former Smoker    Quit date: 11/09/1970  . Smokeless tobacco: Not on file  . Alcohol use Yes     Comment: occasional   . Drug use: No  . Sexual activity: Not on file   Social History Narrative   Retired, lives in [place]. Widowed   Daughter in [place]. Jeanette Espinoza [phone])   Does not get regular exercise    ROS Constitutional: Denies fever, chills, weight loss/gain, headaches, insomnia,  night sweats or change in appetite. Does c/o fatigue. Eyes: Denies redness, blurred vision, diplopia, discharge, itchy or watery eyes.  ENT: Denies discharge, congestion, post nasal drip, epistaxis, sore throat, earache, hearing loss, dental pain, Tinnitus, Vertigo, Sinus pain or snoring.  Cardio: Denies chest pain, palpitations, irregular heartbeat, syncope, dyspnea, diaphoresis, orthopnea, PND, claudication or edema Respiratory: denies cough, dyspnea, DOE, pleurisy, hoarseness, laryngitis or wheezing.  Gastrointestinal: Denies dysphagia, heartburn, reflux, water brash, pain, cramps, nausea, vomiting, bloating, diarrhea, constipation, hematemesis, melena, hematochezia, jaundice or hemorrhoids Genitourinary: Denies dysuria, frequency, urgency, nocturia, hesitancy, discharge, hematuria or flank pain Musculoskeletal: Denies arthralgia, myalgia, stiffness, Jt. Swelling, pain, limp or strain/sprain. Denies Falls. Skin: Denies puritis, rash, hives, warts, acne, eczema or change in skin lesion Neuro: No weakness, tremor, incoordination, spasms, paresthesia or pain Psychiatric: Denies confusion, memory loss or sensory loss. Denies Depression. Endocrine: Denies change in weight, skin, hair change, nocturia, and paresthesia, diabetic polys, visual blurring or hyper / hypo glycemic episodes.  Heme/Lymph: No excessive bleeding, bruising or enlarged lymph nodes.  Physical Exam  BP 130/70   Pulse 72   Temp 97.3 F (36.3 C)   Resp 16   Ht 4' 10.5" (1.486 m)    Wt 131 lb 3.2 oz (59.5 kg)   BMI 26.95 kg/m   General Appearance: Well nourished and appearing much younger than chronological age  in no apparent distress.  Eyes: PERRLA, EOMs, conjunctiva no swelling or erythema, normal fundi and vessels. Sinuses: No frontal/maxillary tenderness ENT/Mouth: EACs patent / TMs  nl. Nares clear without erythema, swelling, mucoid exudates. Oral hygiene is good. No erythema, swelling, or exudate. Tongue normal, non-obstructing. Tonsils not swollen or erythematous. Hearing normal.  Neck: Supple, thyroid normal. No bruits, nodes or JVD. Respiratory: Respiratory effort normal.  BS equal and clear bilateral without rales, rhonci, wheezing or stridor. Cardio: Heart sounds are normal with regular rate and rhythm and no murmurs, rubs or gallops. Peripheral pulses are normal and equal bilaterally without edema. No aortic or femoral bruits. Chest: symmetric with normal excursions and percussion.  Abdomen: Soft, with Nl bowel sounds. Nontender, no guarding, rebound, hernias, masses, or organomegaly.  Lymphatics: Non tender without  lymphadenopathy.  Genitourinary: No hernias.Testes nl. DRE - prostate nl for age - smooth & firm w/o nodules. Musculoskeletal: Full ROM all peripheral extremities, joint stability, 5/5 strength, and normal gait. Skin: Warm and dry without rashes, lesions, cyanosis, clubbing or  ecchymosis.  Neuro: Cranial nerves intact, reflexes equal bilaterally. Normal muscle tone, no cerebellar symptoms. Sensation intact.  Pysch: Alert and oriented X 3 with normal affect, insight and judgment appropriate.   Assessment and Plan  1. Essential hypertension  - TSH  2. Hyperlipidemia  - Lipid panel - TSH  3. T2_NIDDM w/ CKD2, w/o use of insulin (HCC)  - Hemoglobin A1c - Insulin, random  4. Vitamin D deficiency  - VITAMIN D 25 Hydroxy   5. Medication management  - CBC with Differential/Platelet - BASIC METABOLIC PANEL WITH GFR - Hepatic  function panel - Magnesium     Continue prudent diet as discussed, weight control, BP monitoring, regular exercise, and medications as discussed.  Discussed med effects and SE's. Routine screening labs and tests as requested with regular follow-up as recommended. Over 40 minutes of exam, counseling, chart review and high complex critical decision making was performed

## 2016-07-10 NOTE — Patient Instructions (Signed)

## 2016-07-11 LAB — VITAMIN D 25 HYDROXY (VIT D DEFICIENCY, FRACTURES): Vit D, 25-Hydroxy: 68 ng/mL (ref 30–100)

## 2016-07-11 LAB — HEMOGLOBIN A1C
Hgb A1c MFr Bld: 6.3 % — ABNORMAL HIGH (ref <5.7)
MEAN PLASMA GLUCOSE: 134 mg/dL

## 2016-07-11 LAB — INSULIN, RANDOM: INSULIN: 6.2 u[IU]/mL (ref 2.0–19.6)

## 2016-07-13 ENCOUNTER — Encounter: Payer: Self-pay | Admitting: Internal Medicine

## 2016-07-16 ENCOUNTER — Other Ambulatory Visit: Payer: Self-pay | Admitting: Internal Medicine

## 2016-10-15 ENCOUNTER — Ambulatory Visit: Payer: Self-pay | Admitting: Physician Assistant

## 2016-10-15 ENCOUNTER — Other Ambulatory Visit: Payer: Self-pay | Admitting: Physician Assistant

## 2016-10-15 ENCOUNTER — Ambulatory Visit (INDEPENDENT_AMBULATORY_CARE_PROVIDER_SITE_OTHER): Payer: Medicare Other | Admitting: Physician Assistant

## 2016-10-15 ENCOUNTER — Encounter: Payer: Self-pay | Admitting: Physician Assistant

## 2016-10-15 VITALS — BP 140/86 | HR 76 | Temp 97.3°F | Resp 14 | Ht 58.5 in | Wt 129.0 lb

## 2016-10-15 DIAGNOSIS — E782 Mixed hyperlipidemia: Secondary | ICD-10-CM

## 2016-10-15 DIAGNOSIS — Z23 Encounter for immunization: Secondary | ICD-10-CM | POA: Diagnosis not present

## 2016-10-15 DIAGNOSIS — E559 Vitamin D deficiency, unspecified: Secondary | ICD-10-CM | POA: Diagnosis not present

## 2016-10-15 DIAGNOSIS — I10 Essential (primary) hypertension: Secondary | ICD-10-CM | POA: Diagnosis not present

## 2016-10-15 DIAGNOSIS — Z79899 Other long term (current) drug therapy: Secondary | ICD-10-CM | POA: Diagnosis not present

## 2016-10-15 DIAGNOSIS — N183 Chronic kidney disease, stage 3 (moderate): Secondary | ICD-10-CM

## 2016-10-15 DIAGNOSIS — E1122 Type 2 diabetes mellitus with diabetic chronic kidney disease: Secondary | ICD-10-CM | POA: Diagnosis not present

## 2016-10-15 MED ORDER — ATENOLOL 50 MG PO TABS: ORAL_TABLET | ORAL | 1 refills | Status: DC

## 2016-10-15 NOTE — Patient Instructions (Signed)
Please monitor your blood pressure, as we get older our body can not respond to a low blood pressure as well as it did when we were younger, for this reason we want a bit higher of a blood pressure as you get older to avoid dizziness and fatigue which can lead to falls. [place] call if your blood pressure is consistently above 150/90.   Please monitor your blood pressure. If it is getting below 130/80 AND you are having fatigue with exertion, dizziness we may need to cut your blood pressure medication in half. Please call the office if this is happening. Hypotension As your heart beats, it forces blood through your body. This force is called blood pressure. If you have hypotension, you have low blood pressure. When your blood pressure is too low, you may not get enough blood to your brain. You may feel weak, feel lightheaded, have a fast heartbeat, or even pass out (faint). HOME CARE  Drink enough fluids to keep your pee (urine) clear or pale yellow.  Take all medicines as told by your doctor.  Get up slowly after sitting or lying down.  Wear support stockings as told by your doctor.  Maintain a healthy diet by including foods such as fruits, vegetables, nuts, whole grains, and lean meats. GET HELP IF:  You are throwing up (vomiting) or have watery poop (diarrhea).  You have a fever for more than 2-3 days.  You feel more thirsty than usual.  You feel weak and tired. GET HELP RIGHT AWAY IF:   You pass out (faint).  You have chest pain or a fast or irregular heartbeat.  You lose feeling in part of your body.  You cannot move your arms or legs.  You have trouble speaking.  You get sweaty or feel lightheaded. MAKE SURE YOU:   Understand these instructions.  Will watch your condition.  Will get help right away if you are not doing well or get worse. Document Released: 01/20/2010 Document Revised: 06/28/2013 Document Reviewed: 04/28/2013 Exit[employer] Patient Information 2015  [employer]. This information is not intended to replace advice given to you by your health care provider. Make sure you discuss any questions you have with your health care provider.  

## 2016-10-15 NOTE — Progress Notes (Signed)
Assessment and Plan:  Hypertension -Continue medication, monitor blood pressure at home. Continue DASH diet.  Reminder to go to the ER if any CP, SOB, nausea, dizziness, severe HA, changes vision/speech, left arm numbness and tingling and jaw pain.  Cholesterol -Continue diet and exercise, no med due to age. Check cholesterol.   Diabetes with diabetic chronic kidney disease -Continue diet and exercise. Check A1C  Vitamin D Def - check level and continue medications.   Continue diet and meds as discussed. Further disposition pending results of labs. Discussed med's effects and SE's.   Over 30 minutes of exam, counseling, chart review, and critical decision making was performed Future Appointments Date Time Provider [place]  01/21/2017 9:00 AM Unk Pinto, MD [facility] None     HPI 82 y.o. female  presents for 3 month follow up on hypertension, cholesterol, diabetes and vitamin D deficiency.   Her blood pressure has been controlled at home, today her BP is BP: 140/86.  She does not workout, walks with a walker. She lives at [facility] She denies chest pain, shortness of breath, dizziness. Got to see grandson that is 42 years old at thanksgiving. She does drive, goes to grocery store, parks far away in the parking lot to try to get exercise.   She is not on cholesterol medication and denies myalgias. Her cholesterol is not at goal. The cholesterol was:   Lab Results  Component Value Date   CHOL 292 (H) 07/10/2016   HDL 61 07/10/2016   LDLCALC 183 (H) 07/10/2016   TRIG 241 (H) 07/10/2016   CHOLHDL 4.8 07/10/2016    She has been working on diet and exercise for diabetes with diabetic chronic kidney disease, she is not on bASA due to easy bruising, she is on ACE/ARB, she does not check her sugars at home, and denies  hypoglycemia , paresthesia of the feet, polydipsia, polyuria and visual disturbances. Last A1C was:  Lab Results  Component Value Date   HGBA1C 6.3 (H)  07/10/2016    Patient is on Vitamin D supplement, 6000 daily. Lab Results  Component Value Date   VD25OH 68 07/10/2016    Current Medications:  Current Outpatient Prescriptions on File Prior to Visit  Medication Sig Dispense Refill  . atenolol (TENORMIN) 50 MG tablet TAKE 1/2 -1 TAB AT BEDTIME FOR BLOOD PRESSURE 90 tablet 1  . Cholecalciferol (VITAMIN D) 2000 UNITS CAPS Take 6,000 Units by mouth daily.    . enalapril (VASOTEC) 20 MG tablet TAKE 1 TABLET (20 MG TOTAL) BY MOUTH DAILY. 90 tablet 1  . Magnesium 250 MG TABS Take by mouth 2 (two) times daily.    Marland Kitchen triamcinolone ointment (KENALOG) 0.1 % Apply to rash 2-3 x daily as needed 30 g 3   No current facility-administered medications on file prior to visit.    Medical History:  Past Medical History:  Diagnosis Date  . Arthritis   . HLD (hyperlipidemia)   . HTN (hypertension)    echo (11/11) with EF 55-60%, mild LV hypertrophy, PA systolic pressure 34 mmHg  . Low back pain   . Pre-diabetes    diet controlled  . Unspecified vitamin D deficiency    Allergies: No Known Allergies   Review of Systems:  Review of Systems  Constitutional: Negative.   HENT: Negative.   Respiratory: Negative.   Cardiovascular: Negative.   Gastrointestinal: Negative.   Genitourinary: Negative.   Musculoskeletal: Positive for joint pain. Negative for back pain, falls, myalgias and neck pain.  Skin: Negative.   Neurological: Negative.   Endo/Heme/Allergies: Negative for environmental allergies and polydipsia. Bruises/bleeds easily (on ASA).  Psychiatric/Behavioral: Negative.     Family history- Review and unchanged Social history- Review and unchanged Physical Exam: BP 140/86   Pulse 76   Temp 97.3 F (36.3 C)   Resp 14   Ht 4' 10.5" (1.486 m)   Wt 129 lb (58.5 kg)   SpO2 97%   BMI 26.50 kg/m  Wt Readings from Last 3 Encounters:  10/15/16 129 lb (58.5 kg)  07/10/16 131 lb 3.2 oz (59.5 kg)  04/08/16 129 lb (58.5 kg)   General  Appearance: Well nourished, in no apparent distress. Eyes: PERRLA, EOMs, conjunctiva no swelling or erythema Sinuses: No Frontal/maxillary tenderness ENT/Mouth: Ext aud canals clear, TMs without erythema, bulging. No erythema, swelling, or exudate on post pharynx.  Tonsils not swollen or erythematous. Hearing normal.  Neck: Supple, thyroid normal.  Respiratory: Respiratory effort normal, BS equal bilaterally without rales, rhonchi, wheezing or stridor.  Cardio: RRR with no MRGs. Brisk peripheral pulses without edema.  Abdomen: Soft, + BS.  Non tender, no guarding, rebound, hernias, masses. Lymphatics: Non tender without lymphadenopathy.  Musculoskeletal: Full ROM, 5/5 strength, Ambulates with walker, slow Skin: Warm, dry without rashes, lesions, ecchymosis.  Neuro: Cranial nerves intact. No cerebellar symptoms.  Psych: Awake and oriented X 3, normal affect, Insight and Judgment appropriate.    Vicie Mutters, PA-C 10:43 AM [facility]

## 2016-10-16 LAB — BASIC METABOLIC PANEL WITH GFR
BUN: 40 mg/dL — ABNORMAL HIGH (ref 7–25)
CALCIUM: 9.8 mg/dL (ref 8.6–10.4)
CO2: 22 mmol/L (ref 20–31)
CREATININE: 2.13 mg/dL — AB (ref 0.60–0.88)
Chloride: 101 mmol/L (ref 98–110)
GFR, EST AFRICAN AMERICAN: 23 mL/min — AB (ref >=60)
GFR, EST NON AFRICAN AMERICAN: 20 mL/min — AB (ref >=60)
GLUCOSE: 121 mg/dL — AB (ref 65–99)
Potassium: 4.7 mmol/L (ref 3.5–5.3)
SODIUM: 135 mmol/L (ref 135–146)

## 2016-10-16 LAB — HEPATIC FUNCTION PANEL
ALK PHOS: 43 U/L (ref 33–130)
ALT: 8 U/L (ref 6–29)
AST: 12 U/L (ref 10–35)
Albumin: 4.4 g/dL (ref 3.6–5.1)
BILIRUBIN DIRECT: 0 mg/dL (ref <=0.2)
BILIRUBIN INDIRECT: 0.3 mg/dL (ref 0.2–1.2)
Total Bilirubin: 0.3 mg/dL (ref 0.2–1.2)
Total Protein: 7.1 g/dL (ref 6.1–8.1)

## 2016-10-16 LAB — CBC WITH DIFFERENTIAL/PLATELET
BASOS ABS: 0 {cells}/uL (ref 0–200)
Basophils Relative: 0 %
EOS ABS: 72 {cells}/uL (ref 15–500)
EOS PCT: 1 %
HCT: 35.8 % (ref 35.0–45.0)
Hemoglobin: 11.7 g/dL (ref 11.7–15.5)
LYMPHS PCT: 44 %
Lymphs Abs: 3168 cells/uL (ref 850–3900)
MCH: 30.9 pg (ref 27.0–33.0)
MCHC: 32.7 g/dL (ref 32.0–36.0)
MCV: 94.5 fL (ref 80.0–100.0)
MONOS PCT: 9 %
MPV: 10.9 fL (ref 7.5–12.5)
Monocytes Absolute: 648 cells/uL (ref 200–950)
NEUTROS ABS: 3312 {cells}/uL (ref 1500–7800)
Neutrophils Relative %: 46 %
PLATELETS: 183 10*3/uL (ref 140–400)
RBC: 3.79 MIL/uL — ABNORMAL LOW (ref 3.80–5.10)
RDW: 13.2 % (ref 11.0–15.0)
WBC: 7.2 10*3/uL (ref 3.8–10.8)

## 2016-10-16 LAB — LIPID PANEL
CHOL/HDL RATIO: 4.4 ratio (ref <5.0)
CHOLESTEROL: 252 mg/dL — AB (ref <200)
HDL: 57 mg/dL (ref >50)
LDL Cholesterol: 161 mg/dL — ABNORMAL HIGH (ref <100)
Triglycerides: 171 mg/dL — ABNORMAL HIGH (ref <150)
VLDL: 34 mg/dL — ABNORMAL HIGH (ref <30)

## 2016-10-16 LAB — MICROALBUMIN, URINE

## 2016-10-16 LAB — TSH: TSH: 1.82 mIU/L

## 2016-10-16 LAB — HEMOGLOBIN A1C
Hgb A1c MFr Bld: 5.9 % — ABNORMAL HIGH (ref <5.7)
Mean Plasma Glucose: 123 mg/dL

## 2017-01-21 ENCOUNTER — Ambulatory Visit (INDEPENDENT_AMBULATORY_CARE_PROVIDER_SITE_OTHER): Payer: Medicare Other | Admitting: Internal Medicine

## 2017-01-21 VITALS — BP 156/72 | HR 64 | Temp 97.3°F | Resp 16 | Ht 58.5 in | Wt 128.8 lb

## 2017-01-21 DIAGNOSIS — I10 Essential (primary) hypertension: Secondary | ICD-10-CM | POA: Diagnosis not present

## 2017-01-21 DIAGNOSIS — E782 Mixed hyperlipidemia: Secondary | ICD-10-CM

## 2017-01-21 DIAGNOSIS — Z1212 Encounter for screening for malignant neoplasm of rectum: Secondary | ICD-10-CM

## 2017-01-21 DIAGNOSIS — Z0001 Encounter for general adult medical examination with abnormal findings: Secondary | ICD-10-CM

## 2017-01-21 DIAGNOSIS — Z0000 Encounter for general adult medical examination without abnormal findings: Secondary | ICD-10-CM | POA: Diagnosis not present

## 2017-01-21 DIAGNOSIS — Z136 Encounter for screening for cardiovascular disorders: Secondary | ICD-10-CM

## 2017-01-21 DIAGNOSIS — E1122 Type 2 diabetes mellitus with diabetic chronic kidney disease: Secondary | ICD-10-CM

## 2017-01-21 DIAGNOSIS — Z79899 Other long term (current) drug therapy: Secondary | ICD-10-CM

## 2017-01-21 DIAGNOSIS — N183 Chronic kidney disease, stage 3 (moderate): Secondary | ICD-10-CM

## 2017-01-21 DIAGNOSIS — E559 Vitamin D deficiency, unspecified: Secondary | ICD-10-CM

## 2017-01-21 LAB — CBC WITH DIFFERENTIAL/PLATELET
BASOS ABS: 0 {cells}/uL (ref 0–200)
Basophils Relative: 0 %
EOS ABS: 256 {cells}/uL (ref 15–500)
Eosinophils Relative: 4 %
HCT: 36.7 % (ref 35.0–45.0)
HEMOGLOBIN: 12 g/dL (ref 11.7–15.5)
LYMPHS ABS: 2752 {cells}/uL (ref 850–3900)
Lymphocytes Relative: 43 %
MCH: 30.7 pg (ref 27.0–33.0)
MCHC: 32.7 g/dL (ref 32.0–36.0)
MCV: 93.9 fL (ref 80.0–100.0)
MPV: 11.1 fL (ref 7.5–12.5)
Monocytes Absolute: 640 cells/uL (ref 200–950)
Monocytes Relative: 10 %
NEUTROS ABS: 2752 {cells}/uL (ref 1500–7800)
NEUTROS PCT: 43 %
Platelets: 191 10*3/uL (ref 140–400)
RBC: 3.91 MIL/uL (ref 3.80–5.10)
RDW: 13.8 % (ref 11.0–15.0)
WBC: 6.4 10*3/uL (ref 3.8–10.8)

## 2017-01-21 LAB — HEPATIC FUNCTION PANEL
ALK PHOS: 54 U/L (ref 33–130)
ALT: 10 U/L (ref 6–29)
AST: 15 U/L (ref 10–35)
Albumin: 4.4 g/dL (ref 3.6–5.1)
BILIRUBIN DIRECT: 0.1 mg/dL (ref <=0.2)
BILIRUBIN TOTAL: 0.4 mg/dL (ref 0.2–1.2)
Indirect Bilirubin: 0.3 mg/dL (ref 0.2–1.2)
Total Protein: 7.3 g/dL (ref 6.1–8.1)

## 2017-01-21 LAB — LIPID PANEL
CHOL/HDL RATIO: 5.9 ratio — AB (ref <5.0)
Cholesterol: 300 mg/dL — ABNORMAL HIGH (ref <200)
HDL: 51 mg/dL (ref >50)
LDL CALC: 199 mg/dL — AB (ref <100)
Triglycerides: 252 mg/dL — ABNORMAL HIGH (ref <150)
VLDL: 50 mg/dL — ABNORMAL HIGH (ref <30)

## 2017-01-21 LAB — BASIC METABOLIC PANEL WITH GFR
BUN: 31 mg/dL — ABNORMAL HIGH (ref 7–25)
CHLORIDE: 103 mmol/L (ref 98–110)
CO2: 21 mmol/L (ref 20–31)
Calcium: 9.7 mg/dL (ref 8.6–10.4)
Creat: 1.72 mg/dL — ABNORMAL HIGH (ref 0.60–0.88)
GFR, Est African American: 30 mL/min — ABNORMAL LOW (ref >=60)
GFR, Est Non African American: 26 mL/min — ABNORMAL LOW (ref >=60)
Glucose, Bld: 124 mg/dL — ABNORMAL HIGH (ref 65–99)
POTASSIUM: 4.5 mmol/L (ref 3.5–5.3)
SODIUM: 138 mmol/L (ref 135–146)

## 2017-01-21 LAB — TSH: TSH: 2.88 m[IU]/L

## 2017-01-21 NOTE — Patient Instructions (Signed)

## 2017-01-21 NOTE — Progress Notes (Addendum)
[facility] Unk Pinto, M.D.    Uvaldo Bristle. Silverio Lay, P.A.-C      Starlyn Skeans, P.A.-C  [facility]                [address] [place], [place] [address] Telephone [phone] Telefax [phone]  Annual Screening/Preventative Visit & Comprehensive Evaluation &  Examination     This very nice 83 y.o. MWF presents for a Screening/Preventative Visit & comprehensive evaluation and management of multiple medical co-morbidities.  Patient has been followed for HTN, T2_NIDDM  Prediabetes, Hyperlipidemia and Vitamin D Deficiency.      HTN predates since 2007. Patient's BP has been controlled at home and patient denies any cardiac symptoms as chest pain, palpitations, shortness of breath, dizziness or ankle swelling. Today's  BP is sl elevated at 156/72.       Patient's hyperlipidemia is controlled with diet and medications. Patient denies myalgias or other medication SE's. Last lipids were not at goal and aggressive treatment deferred in consideration of age and lack of anticipated benefit. : Lab Results  Component Value Date   CHOL 252 (H) 10/15/2016   HDL 57 10/15/2016   LDLCALC 161 (H) 10/15/2016   TRIG 171 (H) 10/15/2016   CHOLHDL 4.4 10/15/2016      Patient has T2_NIDDM ( A1c  6.5% in 2016) and patient denies reactive hypoglycemic symptoms, visual blurring, diabetic polys, or paresthesias. Last A1c was near goal with diet: Lab Results  Component Value Date   HGBA1C 5.9 (H) 10/15/2016      Finally, patient has history of Vitamin D Deficiency and last Vitamin D was at goal: Lab Results  Component Value Date   VD25OH 68 07/10/2016   Current Outpatient Prescriptions on File Prior to Visit  Medication Sig  . atenolol  50 MG tablet TAKE 1/2 -1 TAB AT BEDTIME   . VITAMIN D 2000 UNITS  Take 6,000 Units  daily.  . enalapril  20 MG  TAKE 1 TAB DAILY.  . Magnesium 250 MG Take2 x / daily.  Marland Kitchen  triamcinolone oint  0.1 % Apply to rash 2-3 x daily as needed   No Known Allergies   Past Medical History:  Diagnosis Date  . Arthritis   . HLD (hyperlipidemia)   . HTN (hypertension)    echo (11/11) with EF 55-60%, mild LV hypertrophy, PA systolic pressure 34 mmHg  . Low back pain   . Pre-diabetes    diet controlled  . Unspecified vitamin D deficiency    Health Maintenance  Topic Date Due  . OPHTHALMOLOGY EXAM  09/18/2015  . FOOT EXAM  09/19/2016  . DEXA SCAN  05/10/2019 (Originally 06/03/1992)  . HEMOGLOBIN A1C  04/15/2017  . TETANUS/TDAP  09/21/2022  . INFLUENZA VACCINE  Completed  . PNA vac Low Risk Adult  Completed   Immunization History  Administered Date(s) Administered  . Influenza Split 10/11/2013  . Influenza, High Dose Seasonal PF 09/18/2014, 09/20/2015  . Influenza, Seasonal, Injecte, Preservative Fre 10/15/2016  . Influenza-Unspecified 10/10/2012  . Pneumococcal Conjugate-13 12/19/2014  . Pneumococcal Polysaccharide-23 10/11/2013  . Tdap 09/21/2012   Past Surgical History:  Procedure Laterality Date  . ABDOMINAL HYSTERECTOMY     partial  . abnormal EKG    . BACK SURGERY    . cataract surgery    . colonoscopsy  2008   Neg  Dr Amedeo Plenty  .  EYE SURGERY Bilateral    cataract  . JOINT REPLACEMENT     left hip replacement , right knee replacement   . lexiscan myoview  11/11   EF 81%, normal perfusion images suggesting no ischemia or infarction  . SPINE SURGERY     lumbar  . surgery for DDD    . TOTAL HIP ARTHROPLASTY  12/02/2012   Procedure: TOTAL HIP ARTHROPLASTY;  Surgeon: Gearlean Alf, MD;  Location: [facility];  Service: Orthopedics;  Laterality: Right;   Family History  Problem Relation Age of Onset  . Diabetes      DM - sibiling   . Coronary artery disease Neg Hx     no premature CAd   . Heart disease Mother    Social History  Substance Use Topics  . Smoking status: Former Smoker    Quit date: 11/09/1970  . Smokeless tobacco: Not on file  .  Alcohol use Yes     Comment: occasional     ROS Constitutional: Denies fever, chills, weight loss/gain, headaches, insomnia,  night sweats, and change in appetite. Does c/o fatigue. Eyes: Denies redness, blurred vision, diplopia, discharge, itchy, watery eyes.  ENT: Denies discharge, congestion, post nasal drip, epistaxis, sore throat, earache, hearing loss, dental pain, Tinnitus, Vertigo, Sinus pain, snoring.  Cardio: Denies chest pain, palpitations, irregular heartbeat, syncope, dyspnea, diaphoresis, orthopnea, PND, claudication, edema Respiratory: denies cough, dyspnea, DOE, pleurisy, hoarseness, laryngitis, wheezing.  Gastrointestinal: Denies dysphagia, heartburn, reflux, water brash, pain, cramps, nausea, vomiting, bloating, diarrhea, constipation, hematemesis, melena, hematochezia, jaundice, hemorrhoids Genitourinary: Denies dysuria, frequency, urgency, nocturia, hesitancy, discharge, hematuria, flank pain Breast: Breast lumps, nipple discharge, bleeding.  Musculoskeletal: Denies arthralgia, myalgia, stiffness, Jt. Swelling, pain, limp, and strain/sprain. Denies falls. Skin: Denies puritis, rash, hives, warts, acne, eczema, changing in skin lesion Neuro: No weakness, tremor, incoordination, spasms, paresthesia, pain Psychiatric: Denies confusion, memory loss, sensory loss. Denies Depression. Endocrine: Denies change in weight, skin, hair change, nocturia, and paresthesia, diabetic polys, visual blurring, hyper / hypo glycemic episodes.  Heme/Lymph: No excessive bleeding, bruising, enlarged lymph nodes.  Physical Exam  BP (!) 156/72   Pulse 64   Temp 97.3 F (36.3 C)   Resp 16   Ht 4' 10.5" (1.486 m)   Wt 128 lb 12.8 oz (58.4 kg)   BMI 26.46 kg/m   General Appearance: Well nourished and in no apparent distress.  Eyes: PERRLA, EOMs, conjunctiva no swelling or erythema, normal fundi and vessels. Sinuses: No frontal/maxillary tenderness ENT/Mouth: EACs patent / TMs  nl. Nares  clear without erythema, swelling, mucoid exudates. Oral hygiene is good. No erythema, swelling, or exudate. Tongue normal, non-obstructing. Tonsils not swollen or erythematous. Hearing normal.  Neck: Supple, thyroid normal. No bruits, nodes or JVD. Respiratory: Respiratory effort normal.  BS equal and clear bilateral without rales, rhonci, wheezing or stridor. Cardio: Heart sounds are normal with regular rate and rhythm and no murmurs, rubs or gallops. Peripheral pulses are normal and equal bilaterally without edema. No aortic or femoral bruits. Chest: symmetric with normal excursions and percussion. Breasts: Symmetric, without lumps, nipple discharge, retractions, or fibrocystic changes.  Abdomen: Flat, soft with bowel sounds active. Nontender, no guarding, rebound, hernias, masses, or organomegaly.  Lymphatics: Non tender without lymphadenopathy.  Musculoskeletal: Full ROM all peripheral extremities, joint stability, 5/5 strength, and normal gait. Skin: Warm and dry without rashes, lesions, cyanosis, clubbing or  ecchymosis.  Neuro: Cranial nerves intact, reflexes equal bilaterally. Normal muscle tone, no cerebellar symptoms. Sensation intact to touch, Vibratory  and Monofilament to the toes bilaterally.  Pysch: Alert and oriented X 3, normal affect, Insight and Judgment appropriate.   Assessment and Plan  1. Annual Preventative Screening Examination   2. Essential hypertension  - Microalbumin / creatinine urine ratio - EKG 12-Lead - Urinalysis, Routine w reflex microscopic - CBC with Differential/Platelet - BASIC METABOLIC PANEL WITH GFR - Magnesium - TSH  3. Hyperlipidemia  - EKG 12-Lead - Hepatic function panel - Lipid panel - TSH  4. Type 2 diabetes mellitus with stage 3 chronic kidney disease, without long-term current use of insulin (HCC)  - Microalbumin / creatinine urine ratio - EKG 12-Lead - HM DIABETES FOOT EXAM - LOW EXTREMITY NEUR EXAM DOCUM - Hemoglobin A1c -  Insulin, random  5. Screening for rectal cancer  - POC Hemoccult Bld/Stl   6. Screening for ischemic heart disease  - EKG 12-Lead  7. Medication management  - Urinalysis, Routine w reflex microscopic - CBC with Differential/Platelet - BASIC METABOLIC PANEL WITH GFR - Hepatic function panel - Lipid panel - TSH - Hemoglobin A1c - Insulin, random - VITAMIN D 25 Hydroxy        Continue prudent diet as discussed, weight control, BP monitoring, regular exercise, and medications. Discussed med's effects and SE's. Screening labs and tests as requested with regular follow-up as recommended. Over 40 minutes of exam, counseling, chart review and high complex critical decision making was performed.

## 2017-01-22 LAB — URINALYSIS, ROUTINE W REFLEX MICROSCOPIC

## 2017-01-22 LAB — MICROALBUMIN / CREATININE URINE RATIO

## 2017-01-22 LAB — HEMOGLOBIN A1C
Hgb A1c MFr Bld: 5.9 % — ABNORMAL HIGH (ref <5.7)
Mean Plasma Glucose: 123 mg/dL

## 2017-01-22 LAB — MAGNESIUM: MAGNESIUM: 2.1 mg/dL (ref 1.5–2.5)

## 2017-01-22 LAB — VITAMIN D 25 HYDROXY (VIT D DEFICIENCY, FRACTURES): VIT D 25 HYDROXY: 67 ng/mL (ref 30–100)

## 2017-01-23 ENCOUNTER — Encounter: Payer: Self-pay | Admitting: Internal Medicine

## 2017-01-25 LAB — INSULIN, RANDOM: Insulin: 14.9 u[IU]/mL (ref 2.0–19.6)

## 2017-03-09 ENCOUNTER — Other Ambulatory Visit: Payer: Self-pay | Admitting: *Deleted

## 2017-03-09 MED ORDER — ENALAPRIL MALEATE 20 MG PO TABS: 20.0000 mg | ORAL_TABLET | Freq: Every day | ORAL | 1 refills | Status: DC

## 2017-04-26 NOTE — Progress Notes (Signed)
MEDICARE ANNUAL WELLNESS VISIT AND FOLLOW UP  Assessment:   Essential hypertension -recommended atenolol in PM and enalarpil in AM -DASH diet -cont exercise as tolerated - TSH   Type 2 diabetes mellitus with stage 3 chronic kidney disease, without long-term current use of insulin (HCC) -cont diet and exercise -UTD with eye exams - Hemoglobin A1c   Hyperlipidemia -cont diet and exercise -given age is not a candidate for statins - Lipid panel  Vitamin D deficiency -cont Vit D supplement  Medication management - CBC with Differential/Platelet - BASIC METABOLIC PANEL WITH GFR - Hepatic function panel   Primary osteoarthritis of both hips -cont movement and exercise as tolerated   Medicare annual wellness visit, subsequent -due next year.   Over 30 minutes of exam, counseling, chart review, and critical decision making was performed Future Appointments Date Time Provider [place]  08/04/2017 9:30 AM Unk Pinto, MD [facility] None  02/11/2018 9:00 AM Unk Pinto, MD [facility] None     Plan:   During the course of the visit the patient was educated and counseled about appropriate screening and preventive services including:    Pneumococcal vaccine   Influenza vaccine  Td vaccine  Prevnar 13  Screening electrocardiogram  Screening mammography  Bone densitometry screening  Colorectal cancer screening  Diabetes screening  Glaucoma screening  Nutrition counseling   Advanced directives: given info/requested copies  Subjective:   Jeanette Espinoza is a 83 y.o. female who presents for Medicare Annual Wellness Visit and 3 month follow up on hypertension, prediabetes, hyperlipidemia, vitamin D def.   Her blood pressure has been controlled at home, today their BP is BP: 120/68 She does not workout. She denies chest pain, shortness of breath, dizziness.   She is not on cholesterol medication and denies myalgias. Her cholesterol is not at  goal. The cholesterol last visit was:   Lab Results  Component Value Date   CHOL 300 (H) 01/21/2017   HDL 51 01/21/2017   LDLCALC 199 (H) 01/21/2017   TRIG 252 (H) 01/21/2017   CHOLHDL 5.9 (H) 01/21/2017   She does have diabetes, she has had an A1C as high as 7.1 in the past but she is controlling it with diet and exercise.  She notes that she is asymptomatic. Lab Results  Component Value Date   HGBA1C 5.9 (H) 01/21/2017   Last GFR Lab Results  Component Value Date   GFRNONAA 26 (L) 01/21/2017   Patient is on Vitamin D supplement. Lab Results  Component Value Date   VD25OH 67 01/21/2017     BMI is Body mass index is 26.05 kg/m., she is working on diet and exercise. Wt Readings from Last 3 Encounters:  04/27/17 126 lb 12.8 oz (57.5 kg)  01/21/17 128 lb 12.8 oz (58.4 kg)  10/15/16 129 lb (58.5 kg)    Medication Review Current Outpatient Prescriptions on File Prior to Visit  Medication Sig Dispense Refill  . atenolol (TENORMIN) 50 MG tablet TAKE 1/2 -1 TAB AT BEDTIME FOR BLOOD PRESSURE 90 tablet 1  . Cholecalciferol (VITAMIN D) 2000 UNITS CAPS Take 6,000 Units by mouth daily.    . enalapril (VASOTEC) 20 MG tablet Take 1 tablet (20 mg total) by mouth daily. 90 tablet 1  . Magnesium 250 MG TABS Take by mouth 2 (two) times daily.    Marland Kitchen triamcinolone ointment (KENALOG) 0.1 % Apply to rash 2-3 x daily as needed 30 g 3   No current facility-administered medications on file prior to visit.  Current Problems (verified) Patient Active Problem List   Diagnosis Date Noted  . HTN (hypertension) 01/02/2016  . Medication management 02/06/2014  . 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 Hyperlipidemia   . Vitamin D deficiency   . OA (osteoarthritis) of hip 12/02/2012    Screening Tests Immunization History  Administered Date(s) Administered  . Influenza Split 10/11/2013  . Influenza, High Dose Seasonal PF 09/18/2014, 09/20/2015  . Influenza, Seasonal, Injecte,  Preservative Fre 10/15/2016  . Influenza-Unspecified 10/10/2012  . Pneumococcal Conjugate-13 12/19/2014  . Pneumococcal Polysaccharide-23 10/11/2013  . Tdap 09/21/2012    Preventative care: Last colonoscopy: 2008 will not get another Last mammogram: 2017  Prior vaccinations: TD or Tdap: 2013  Influenza: 2016  Pneumococcal: 2014 Prevnar13: 2016 Shingles/Zostavax: Declined  Names of Other Physician/Practitioners you currently use: 1. [facility]- here for primary care 2. Dr. Sabra Heck, eye doctor, last visit Oct 2017 3. Dr. Lavone Neri , dentist, last visit Oct 2017 Patient Care Team: Unk Pinto, MD as PCP - General (Internal Medicine) Gaynelle Arabian, MD as Consulting Physician (Orthopedic Surgery) Harrie Foreman, [place] as Referring Physician (Optometry) Teena Irani, MD as Consulting Physician (Gastroenterology) Larey Dresser, MD as Consulting Physician (Cardiology) Suella Broad, MD as Consulting Physician (Physical Medicine and Rehabilitation)  Allergies No Known Allergies  SURGICAL HISTORY She  has a past surgical history that includes lexiscan myoview (11/11); surgery for DDD; cataract surgery; abnormal EKG; Back surgery; Total hip arthroplasty (12/02/2012); Eye surgery (Bilateral); Spine surgery; Abdominal hysterectomy; Joint replacement; and colonoscopsy (2008). FAMILY HISTORY Her family history includes Heart disease in her mother. SOCIAL HISTORY She  reports that she quit smoking about 46 years ago. She has never used smokeless tobacco. She reports that she drinks alcohol. She reports that she does not use drugs.   MEDICARE WELLNESS OBJECTIVES: Physical activity: Current Exercise Habits: The patient does not participate in regular exercise at present Cardiac risk factors: Cardiac Risk Factors include: (P) advanced age (>95men, >6 women);family history of premature cardiovascular disease;hypertension;dyslipidemia;sedentary  lifestyle Depression/mood screen:   Depression screen [facility] 2/9 04/27/2017  Decreased Interest 0  Down, Depressed, Hopeless 0  PHQ - 2 Score 0    ADLs:  In your present state of health, do you have any difficulty performing the following activities: 04/27/2017 01/23/2017  Hearing? N N  Vision? N N  Difficulty concentrating or making decisions? N N  Walking or climbing stairs? Y N  Dressing or bathing? N N  Doing errands, shopping? N N  Preparing Food and eating ? N -  Using the Toilet? N -  In the past six months, have you accidently leaked urine? N -  Do you have problems with loss of bowel control? N -  Managing your Medications? N -  Managing your Finances? N -  Housekeeping or managing your Housekeeping? N -  Some recent data might be hidden     Cognitive Testing  Alert? Yes  Normal Appearance?Yes  Oriented to person? Yes  Place? Yes   Time? Yes  Recall of three objects?  Yes  Can perform simple calculations? Yes  Displays appropriate judgment?Yes  Can read the correct time from a watch face?Yes  EOL planning: Does Patient Have a Medical Advance Directive?: Yes Type of Advance Directive: Healthcare Power of Attorney, Living will Copy of [place] in Chart?: No - copy requested   Objective:   Today's Vitals   04/27/17 0920  BP: 120/68  Pulse: 73  Resp: 14  Temp: 97.3 F (36.3 C)  SpO2: 97%  Weight: 126 lb 12.8 oz (57.5 kg)  Height: 4' 10.5" (1.486 m)  PainSc: 0-No pain   Body mass index is 26.05 kg/m.  General appearance: alert, no distress, WD/WN,  female HEENT: normocephalic, sclerae anicteric, TMs pearly, nares patent, no discharge or erythema, pharynx normal Oral cavity: MMM, no lesions Neck: supple, no lymphadenopathy, no thyromegaly, no masses Heart: RRR, normal S1, S2, no murmurs Lungs: CTA bilaterally, no wheezes, rhonchi, or rales Abdomen: +bs, soft, non tender, non distended, no masses, no hepatomegaly, no  splenomegaly Musculoskeletal: nontender, no swelling, no obvious deformity Extremities: no edema, no cyanosis, no clubbing Pulses: 2+ symmetric, upper and lower extremities, normal cap refill Neurological: alert, oriented x 3, CN2-12 intact, strength normal upper extremities and lower extremities, sensation normal throughout, DTRs 2+ throughout, no cerebellar signs, gait normal with walker Psychiatric: normal affect, behavior normal, pleasant  Breast: defer Gyn: defer Rectal: defer   Medicare Attestation I have personally reviewed: The patient's medical and social history Their use of alcohol, tobacco or illicit drugs Their current medications and supplements The patient's functional ability including ADLs,fall risks, home safety risks, cognitive, and hearing and visual impairment Diet and physical activities Evidence for depression or mood disorders  The patient's weight, height, BMI, and visual acuity have been recorded in the chart.  I have made referrals, counseling, and provided education to the patient based on review of the above and I have provided the patient with a written personalized care plan for preventive services.     Vicie Mutters, PA-C   04/27/2017

## 2017-04-27 ENCOUNTER — Ambulatory Visit (INDEPENDENT_AMBULATORY_CARE_PROVIDER_SITE_OTHER): Payer: Medicare Other | Admitting: Physician Assistant

## 2017-04-27 ENCOUNTER — Encounter: Payer: Self-pay | Admitting: Physician Assistant

## 2017-04-27 VITALS — BP 120/68 | HR 73 | Temp 97.3°F | Resp 14 | Ht 58.5 in | Wt 126.8 lb

## 2017-04-27 DIAGNOSIS — E559 Vitamin D deficiency, unspecified: Secondary | ICD-10-CM | POA: Diagnosis not present

## 2017-04-27 DIAGNOSIS — Z79899 Other long term (current) drug therapy: Secondary | ICD-10-CM | POA: Diagnosis not present

## 2017-04-27 DIAGNOSIS — E782 Mixed hyperlipidemia: Secondary | ICD-10-CM

## 2017-04-27 DIAGNOSIS — N183 Chronic kidney disease, stage 3 (moderate): Secondary | ICD-10-CM

## 2017-04-27 DIAGNOSIS — R6889 Other general symptoms and signs: Secondary | ICD-10-CM | POA: Diagnosis not present

## 2017-04-27 DIAGNOSIS — I10 Essential (primary) hypertension: Secondary | ICD-10-CM | POA: Diagnosis not present

## 2017-04-27 DIAGNOSIS — E1122 Type 2 diabetes mellitus with diabetic chronic kidney disease: Secondary | ICD-10-CM | POA: Diagnosis not present

## 2017-04-27 DIAGNOSIS — Z0001 Encounter for general adult medical examination with abnormal findings: Secondary | ICD-10-CM

## 2017-04-27 DIAGNOSIS — M160 Bilateral primary osteoarthritis of hip: Secondary | ICD-10-CM | POA: Diagnosis not present

## 2017-04-27 DIAGNOSIS — Z0000 Encounter for general adult medical examination without abnormal findings: Secondary | ICD-10-CM

## 2017-04-27 LAB — CBC WITH DIFFERENTIAL/PLATELET
Basophils Absolute: 0 cells/uL (ref 0–200)
Basophils Relative: 0 %
EOS ABS: 122 {cells}/uL (ref 15–500)
Eosinophils Relative: 2 %
HEMATOCRIT: 35.2 % (ref 35.0–45.0)
HEMOGLOBIN: 11.8 g/dL (ref 11.7–15.5)
LYMPHS ABS: 2745 {cells}/uL (ref 850–3900)
LYMPHS PCT: 45 %
MCH: 31.3 pg (ref 27.0–33.0)
MCHC: 33.5 g/dL (ref 32.0–36.0)
MCV: 93.4 fL (ref 80.0–100.0)
MONO ABS: 549 {cells}/uL (ref 200–950)
MPV: 10.9 fL (ref 7.5–12.5)
Monocytes Relative: 9 %
Neutro Abs: 2684 cells/uL (ref 1500–7800)
Neutrophils Relative %: 44 %
Platelets: 189 10*3/uL (ref 140–400)
RBC: 3.77 MIL/uL — AB (ref 3.80–5.10)
RDW: 13.1 % (ref 11.0–15.0)
WBC: 6.1 10*3/uL (ref 3.8–10.8)

## 2017-04-27 LAB — HEPATIC FUNCTION PANEL
ALBUMIN: 4.3 g/dL (ref 3.6–5.1)
ALT: 9 U/L (ref 6–29)
AST: 13 U/L (ref 10–35)
Alkaline Phosphatase: 50 U/L (ref 33–130)
BILIRUBIN INDIRECT: 0.3 mg/dL (ref 0.2–1.2)
Bilirubin, Direct: 0.1 mg/dL (ref <=0.2)
TOTAL PROTEIN: 7.1 g/dL (ref 6.1–8.1)
Total Bilirubin: 0.4 mg/dL (ref 0.2–1.2)

## 2017-04-27 LAB — LIPID PANEL
Cholesterol: 284 mg/dL — ABNORMAL HIGH (ref <200)
HDL: 48 mg/dL — ABNORMAL LOW (ref >50)
LDL CALC: 182 mg/dL — AB (ref <100)
TRIGLYCERIDES: 269 mg/dL — AB (ref <150)
Total CHOL/HDL Ratio: 5.9 Ratio — ABNORMAL HIGH (ref <5.0)
VLDL: 54 mg/dL — AB (ref <30)

## 2017-04-27 LAB — BASIC METABOLIC PANEL WITH GFR
BUN: 34 mg/dL — AB (ref 7–25)
CHLORIDE: 101 mmol/L (ref 98–110)
CO2: 23 mmol/L (ref 20–31)
Calcium: 9.5 mg/dL (ref 8.6–10.4)
Creat: 1.78 mg/dL — ABNORMAL HIGH (ref 0.60–0.88)
GFR, EST NON AFRICAN AMERICAN: 25 mL/min — AB (ref >=60)
GFR, Est African American: 29 mL/min — ABNORMAL LOW (ref >=60)
Glucose, Bld: 140 mg/dL — ABNORMAL HIGH (ref 65–99)
POTASSIUM: 4.5 mmol/L (ref 3.5–5.3)
Sodium: 135 mmol/L (ref 135–146)

## 2017-04-27 LAB — TSH: TSH: 1.52 mIU/L

## 2017-04-27 MED ORDER — TRIAMCINOLONE ACETONIDE 0.1 % EX OINT: TOPICAL_OINTMENT | CUTANEOUS | 3 refills | Status: DC

## 2017-04-27 NOTE — Patient Instructions (Signed)

## 2017-04-28 LAB — HEMOGLOBIN A1C
HEMOGLOBIN A1C: 6.2 % — AB (ref <5.7)
Mean Plasma Glucose: 131 mg/dL

## 2017-04-28 NOTE — Progress Notes (Signed)
Pt aware of lab results & voiced understanding of those results.

## 2017-08-04 ENCOUNTER — Ambulatory Visit (INDEPENDENT_AMBULATORY_CARE_PROVIDER_SITE_OTHER): Payer: Medicare Other | Admitting: Internal Medicine

## 2017-08-04 ENCOUNTER — Encounter: Payer: Self-pay | Admitting: Internal Medicine

## 2017-08-04 VITALS — BP 138/76 | HR 64 | Temp 97.5°F | Resp 16 | Ht 58.5 in | Wt 121.4 lb

## 2017-08-04 DIAGNOSIS — N183 Chronic kidney disease, stage 3 (moderate): Secondary | ICD-10-CM

## 2017-08-04 DIAGNOSIS — E1122 Type 2 diabetes mellitus with diabetic chronic kidney disease: Secondary | ICD-10-CM | POA: Diagnosis not present

## 2017-08-04 DIAGNOSIS — Z79899 Other long term (current) drug therapy: Secondary | ICD-10-CM

## 2017-08-04 DIAGNOSIS — Z23 Encounter for immunization: Secondary | ICD-10-CM

## 2017-08-04 DIAGNOSIS — E559 Vitamin D deficiency, unspecified: Secondary | ICD-10-CM

## 2017-08-04 DIAGNOSIS — E782 Mixed hyperlipidemia: Secondary | ICD-10-CM | POA: Diagnosis not present

## 2017-08-04 DIAGNOSIS — I10 Essential (primary) hypertension: Secondary | ICD-10-CM

## 2017-08-04 NOTE — Patient Instructions (Signed)

## 2017-08-04 NOTE — Progress Notes (Signed)
This very nice 83 y.o. MWF presents for 6 month follow up with Hypertension, Hyperlipidemia,T2_NIDDM and Vitamin D Deficiency.      Patient is treated for HTN (2007) & BP has been controlled at home. Today's BP is at goal - 138/76. Patient has had no complaints of any cardiac type chest pain, palpitations, dyspnea/orthopnea/PND, dizziness, claudication, or dependent edema.     Hyperlipidemia is not controlled with diet & meds are deferred for age.  Last Lipids were not at goal: Lab Results  Component Value Date   CHOL 284 (H) 04/27/2017   HDL 48 (L) 04/27/2017   LDLCALC 182 (H) 04/27/2017   TRIG 269 (H) 04/27/2017   CHOLHDL 5.9 (H) 04/27/2017      Also, the patient has history of T2_NIDDM ( A1c 6.5%  / 2016)  Currently managed with diet and she has had no symptoms of reactive hypoglycemia, diabetic polys, paresthesias or visual blurring.  Last A1c was not at goal: Lab Results  Component Value Date   HGBA1C 6.2 (H) 04/27/2017      Further, the patient also has history of Vitamin D Deficiency and supplements vitamin D without any suspected side-effects. Last vitamin D was at goal: Lab Results  Component Value Date   VD25OH 67 01/21/2017   Current Outpatient Prescriptions on File Prior to Visit  Medication Sig  . atenolol (TENORMIN) 50 MG tablet TAKE 1/2 -1 TAB AT BEDTIME FOR BLOOD PRESSURE  . Cholecalciferol (VITAMIN D) 2000 UNITS CAPS Take 6,000 Units by mouth daily.  . enalapril (VASOTEC) 20 MG tablet Take 1 tablet (20 mg total) by mouth daily.  . Magnesium 250 MG TABS Take by mouth 2 (two) times daily.  Marland Kitchen triamcinolone ointment (KENALOG) 0.1 % Apply to rash 2-3 x daily as needed   No current facility-administered medications on file prior to visit.    No Known Allergies   PMHx:   Past Medical History:  Diagnosis Date  . Arthritis   . HLD (hyperlipidemia)   . HTN (hypertension)    echo (11/11) with EF 55-60%, mild LV hypertrophy, PA systolic pressure 34 mmHg  . Low  back pain   . Pre-diabetes    diet controlled  . Unspecified vitamin D deficiency    Immunization History  Administered Date(s) Administered  . Influenza Split 10/11/2013  . Influenza, High Dose Seasonal PF 09/18/2014, 09/20/2015  . Influenza, Seasonal, Injecte, Preservative Fre 10/15/2016  . Influenza-Unspecified 10/10/2012  . Pneumococcal Conjugate-13 12/19/2014  . Pneumococcal Polysaccharide-23 10/11/2013  . Tdap 09/21/2012   Past Surgical History:  Procedure Laterality Date  . ABDOMINAL HYSTERECTOMY     partial  . abnormal EKG    . BACK SURGERY    . cataract surgery    . colonoscopsy  2008   Neg  Dr Amedeo Plenty  . EYE SURGERY Bilateral    cataract  . JOINT REPLACEMENT     left hip replacement , right knee replacement   . lexiscan myoview  11/11   EF 81%, normal perfusion images suggesting no ischemia or infarction  . SPINE SURGERY     lumbar  . surgery for DDD    . TOTAL HIP ARTHROPLASTY  12/02/2012   Procedure: TOTAL HIP ARTHROPLASTY;  Surgeon: Gearlean Alf, MD;  Location: [facility];  Service: Orthopedics;  Laterality: Right;   FHx:    Reviewed / unchanged  SHx:    Reviewed / unchanged  Systems Review:  Constitutional: Denies fever, chills, wt changes, headaches, insomnia,  fatigue, night sweats, change in appetite. Eyes: Denies redness, blurred vision, diplopia, discharge, itchy, watery eyes.  ENT: Denies discharge, congestion, post nasal drip, epistaxis, sore throat, earache, hearing loss, dental pain, tinnitus, vertigo, sinus pain, snoring.  CV: Denies chest pain, palpitations, irregular heartbeat, syncope, dyspnea, diaphoresis, orthopnea, PND, claudication or edema. Respiratory: denies cough, dyspnea, DOE, pleurisy, hoarseness, laryngitis, wheezing.  Gastrointestinal: Denies dysphagia, odynophagia, heartburn, reflux, water brash, abdominal pain or cramps, nausea, vomiting, bloating, diarrhea, constipation, hematemesis, melena, hematochezia  or  hemorrhoids. Genitourinary: Denies dysuria, frequency, urgency, nocturia, hesitancy, discharge, hematuria or flank pain. Musculoskeletal: Denies arthralgias, myalgias, stiffness, jt. swelling, pain, limping or strain/sprain.  Skin: Denies pruritus, rash, hives, warts, acne, eczema or change in skin lesion(s). Neuro: No weakness, tremor, incoordination, spasms, paresthesia or pain. Psychiatric: Denies confusion, memory loss or sensory loss. Endo: Denies change in weight, skin or hair change.  Heme/Lymph: No excessive bleeding, bruising or enlarged lymph nodes.  Physical Exam  BP 138/76   Pulse 64   Temp (!) 97.5 F (36.4 C)   Resp 16   Ht 4' 10.5" (1.486 m)   Wt 121 lb 6.4 oz (55.1 kg)   BMI 24.94 kg/m   Appears well nourished, well groomed  and in no distress.  Eyes: PERRLA, EOMs, conjunctiva no swelling or erythema. Sinuses: No frontal/maxillary tenderness ENT/Mouth: EAC's clear, TM's nl w/o erythema, bulging. Nares clear w/o erythema, swelling, exudates. Oropharynx clear without erythema or exudates. Oral hygiene is good. Tongue normal, non obstructing. Hearing intact.  Neck: Supple. Thyroid nl. Car 2+/2+ without bruits, nodes or JVD. Chest: Respirations nl with BS clear & equal w/o rales, rhonchi, wheezing or stridor.  Cor: Heart sounds normal w/ regular rate and rhythm without sig. murmurs, gallops, clicks or rubs. Peripheral pulses normal and equal  without edema.  Abdomen: Soft & bowel sounds normal. Non-tender w/o guarding, rebound, hernias, masses or organomegaly.  Lymphatics: Unremarkable.  Musculoskeletal: Full ROM all peripheral extremities, joint stability, 5/5 strength and normal gait.  Skin: Warm, dry without exposed rashes, lesions or ecchymosis apparent.  Neuro: Cranial nerves intact, reflexes equal bilaterally. Sensory-motor testing grossly intact. Tendon reflexes grossly intact.  Pysch: Alert & oriented x 3.  Insight and judgement nl & appropriate. No  ideations.  Assessment and Plan:  1. Essential hypertension  - Continue medication, monitor blood pressure at home.  - Continue DASH diet. Reminder to go to the ER if any CP,  SOB, nausea, dizziness, severe HA, changes vision/speech.  - CBC with Differential/Platelet - BASIC METABOLIC PANEL WITH GFR - Magnesium  2. Hyperlipidemia, mixed  - Continue diet/meds, exercise,& lifestyle modifications.  - Continue monitor periodic cholesterol/liver & renal functions   - Hepatic function panel - TSH  3. Type 2 diabetes mellitus with stage 3 chronic kidney disease, without long-term current use of insulin (HCC)  - Continue diet, exercise, lifestyle modifications.  - Monitor appropriate labs.  - Hemoglobin A1c - Insulin, random  4. Vitamin D deficiency  - Continue supplementation.  - VITAMIN D 25 Hydroxy  5. Medication management  - CBC with Differential/Platelet - BASIC METABOLIC PANEL WITH GFR - Hepatic function panel - Magnesium - TSH - Hemoglobin A1c - Insulin, random - VITAMIN D 25 Hydroxy   6. Need for immunization against influenza  - Flu vaccine HIGH DOSE PF (Fluzone High dose)       Discussed  regular exercise, BP monitoring, weight control to achieve/maintain BMI less than 25 and discussed med and SE's. Recommended labs to assess and  monitor clinical status with further disposition pending results of labs. Over 30 minutes of exam, counseling, chart review was performed.

## 2017-08-05 LAB — CBC WITH DIFFERENTIAL/PLATELET
Basophils Absolute: 29 cells/uL (ref 0–200)
Basophils Relative: 0.4 %
EOS ABS: 151 {cells}/uL (ref 15–500)
EOS PCT: 2.1 %
HEMATOCRIT: 36.5 % (ref 35.0–45.0)
HEMOGLOBIN: 12.5 g/dL (ref 11.7–15.5)
LYMPHS ABS: 2801 {cells}/uL (ref 850–3900)
MCH: 31.6 pg (ref 27.0–33.0)
MCHC: 34.2 g/dL (ref 32.0–36.0)
MCV: 92.2 fL (ref 80.0–100.0)
MPV: 11.7 fL (ref 7.5–12.5)
Monocytes Relative: 8.3 %
Neutro Abs: 3622 cells/uL (ref 1500–7800)
Neutrophils Relative %: 50.3 %
Platelets: 212 10*3/uL (ref 140–400)
RBC: 3.96 10*6/uL (ref 3.80–5.10)
RDW: 12.2 % (ref 11.0–15.0)
Total Lymphocyte: 38.9 %
WBC: 7.2 10*3/uL (ref 3.8–10.8)
WBCMIX: 598 {cells}/uL (ref 200–950)

## 2017-08-05 LAB — HEPATIC FUNCTION PANEL
AG RATIO: 1.5 (calc) (ref 1.0–2.5)
ALBUMIN MSPROF: 4.6 g/dL (ref 3.6–5.1)
ALT: 9 U/L (ref 6–29)
AST: 15 U/L (ref 10–35)
Alkaline phosphatase (APISO): 56 U/L (ref 33–130)
BILIRUBIN TOTAL: 0.4 mg/dL (ref 0.2–1.2)
Bilirubin, Direct: 0.1 mg/dL (ref 0.0–0.2)
Globulin: 3 g/dL (calc) (ref 1.9–3.7)
Indirect Bilirubin: 0.3 mg/dL (calc) (ref 0.2–1.2)
Total Protein: 7.6 g/dL (ref 6.1–8.1)

## 2017-08-05 LAB — VITAMIN D 25 HYDROXY (VIT D DEFICIENCY, FRACTURES): VIT D 25 HYDROXY: 66 ng/mL (ref 30–100)

## 2017-08-05 LAB — BASIC METABOLIC PANEL WITH GFR
BUN/Creatinine Ratio: 16 (calc) (ref 6–22)
BUN: 28 mg/dL — ABNORMAL HIGH (ref 7–25)
CALCIUM: 9.8 mg/dL (ref 8.6–10.4)
CO2: 25 mmol/L (ref 20–32)
Chloride: 102 mmol/L (ref 98–110)
Creat: 1.73 mg/dL — ABNORMAL HIGH (ref 0.60–0.88)
GFR, EST NON AFRICAN AMERICAN: 26 mL/min/{1.73_m2} — AB (ref >=60)
GFR, Est African American: 30 mL/min/{1.73_m2} — ABNORMAL LOW (ref >=60)
Glucose, Bld: 110 mg/dL — ABNORMAL HIGH (ref 65–99)
Potassium: 4.8 mmol/L (ref 3.5–5.3)
Sodium: 137 mmol/L (ref 135–146)

## 2017-08-05 LAB — INSULIN, RANDOM: INSULIN: 3.8 u[IU]/mL (ref 2.0–19.6)

## 2017-08-05 LAB — HEMOGLOBIN A1C
EAG (MMOL/L): 7 (calc)
Hgb A1c MFr Bld: 6 % of total Hgb — ABNORMAL HIGH (ref <5.7)
Mean Plasma Glucose: 126 (calc)

## 2017-08-05 LAB — MAGNESIUM: Magnesium: 2.2 mg/dL (ref 1.5–2.5)

## 2017-08-05 LAB — TSH: TSH: 2.08 mIU/L (ref 0.40–4.50)

## 2017-09-02 ENCOUNTER — Other Ambulatory Visit: Payer: Self-pay | Admitting: Internal Medicine

## 2017-09-17 LAB — HM DIABETES EYE EXAM

## 2017-10-04 ENCOUNTER — Encounter: Payer: Self-pay | Admitting: Internal Medicine

## 2017-11-03 DIAGNOSIS — N184 Chronic kidney disease, stage 4 (severe): Secondary | ICD-10-CM

## 2017-11-03 DIAGNOSIS — E1122 Type 2 diabetes mellitus with diabetic chronic kidney disease: Secondary | ICD-10-CM | POA: Insufficient documentation

## 2017-11-03 NOTE — Progress Notes (Signed)
FOLLOW UP  Assessment and Plan:   Hypertension Well controlled with current medications  Monitor blood pressure at home; patient to call if consistently greater than 130/80 Continue DASH diet.   Reminder to go to the ER if any CP, SOB, nausea, dizziness, severe HA, changes vision/speech, left arm numbness and tingling and jaw pain.  Cholesterol Not at goal; treated by lifestyle without medications secondary to age  Continue low cholesterol diet and exercise.  Check lipid panel.   Diabetes with diabetic chronic kidney disease Currently with A1Cs in prediabetic range off of medications Continue diet and exercise.  Perform daily foot/skin check, notify office of any concerning changes.  Check A1C, BMP with GFR  Stage 4 CKD secondary to diabetes  Increase fluids, avoid NSAIDS, monitor sugars, will monitor  Vitamin D Def/ osteoporosis prevention Continue supplementation Check Vit D level  Continue diet and meds as discussed. Further disposition pending results of labs. Discussed med's effects and SE's.   Over 30 minutes of exam, counseling, chart review, and critical decision making was performed.   Future Appointments  Date Time Provider [place]  02/11/2018  9:00 AM Unk Pinto, MD [facility] None    ----------------------------------------------------------------------------------------------------------------------  HPI 83 y.o. Caucasian female who lives in assisted living community presents for 3 month follow up on hypertension, cholesterol, T2 diabetes, stable 4th stage CKD and vitamin D deficiency.   Her blood pressure has been controlled at home, today their BP is BP: 124/60  She does not workout - she is limited by ability to ambulate - she reports she does make an effort to walk daily with her walker.  She denies chest pain, shortness of breath, dizziness.   She is not on cholesterol medication secondary to age and denies myalgias. Her cholesterol is not  at goal. The cholesterol last visit was:   Lab Results  Component Value Date   CHOL 284 (H) 04/27/2017   HDL 48 (L) 04/27/2017   LDLCALC 182 (H) 04/27/2017   TRIG 269 (H) 04/27/2017   CHOLHDL 5.9 (H) 04/27/2017    She has been working on diet and exercise for T2 diabetes, and denies nausea, polydipsia, polyuria, visual disturbances, vomiting and weight loss. She has recently demonstrated improved A1Cs in prediabetic range. Last A1C in the office was:  Lab Results  Component Value Date   HGBA1C 6.0 (H) 08/04/2017   Patient is on Vitamin D supplement and was near goal of 70 at last visit:   Lab Results  Component Value Date   VD25OH 66 08/04/2017     She has stable stage 4 CKD secondary to diabetes:  Lab Results  Component Value Date   GFRNONAA 26 (L) 08/04/2017   GFRNONAA 25 (L) 04/27/2017   GFRNONAA 26 (L) 01/21/2017    Current Medications:  Current Outpatient Medications on File Prior to Visit  Medication Sig  . atenolol (TENORMIN) 50 MG tablet TAKE 1/2 -1 TAB AT BEDTIME FOR BLOOD PRESSURE  . Cholecalciferol (VITAMIN D) 2000 UNITS CAPS Take 6,000 Units by mouth daily.  . enalapril (VASOTEC) 20 MG tablet TAKE 1 TABLET BY MOUTH EVERY DAY  . Magnesium 250 MG TABS Take by mouth 2 (two) times daily.   No current facility-administered medications on file prior to visit.      Allergies: No Known Allergies   Medical History:  Past Medical History:  Diagnosis Date  . Arthritis   . HLD (hyperlipidemia)   . HTN (hypertension)    echo (11/11) with EF 55-60%, mild  LV hypertrophy, PA systolic pressure 34 mmHg  . Low back pain   . Pre-diabetes    diet controlled  . Unspecified vitamin D deficiency    Family history- Reviewed and unchanged Social history- Reviewed and unchanged   Review of Systems:  Review of Systems  Constitutional: Negative for malaise/fatigue and weight loss.  HENT: Negative for hearing loss and tinnitus.   Eyes: Negative for blurred vision and double  vision.  Respiratory: Negative for cough, shortness of breath and wheezing.   Cardiovascular: Negative for chest pain, palpitations, orthopnea, claudication and leg swelling.  Gastrointestinal: Negative for abdominal pain, blood in stool, constipation, diarrhea, heartburn, melena, nausea and vomiting.  Genitourinary: Negative.   Musculoskeletal: Negative for joint pain and myalgias.  Skin: Negative for rash.  Neurological: Negative for dizziness, tingling, sensory change, weakness and headaches.  Endo/Heme/Allergies: Negative for polydipsia.  Psychiatric/Behavioral: Negative.   All other systems reviewed and are negative.    Physical Exam: BP 124/60   Pulse 71   Temp (!) 97.5 F (36.4 C)   Ht 4' 10.5" (1.486 m)   Wt 124 lb 3.2 oz (56.3 kg)   SpO2 99%   BMI 25.52 kg/m  Wt Readings from Last 3 Encounters:  11/04/17 124 lb 3.2 oz (56.3 kg)  08/04/17 121 lb 6.4 oz (55.1 kg)  04/27/17 126 lb 12.8 oz (57.5 kg)   General Appearance: Well nourished, in no apparent distress. Eyes: PERRLA, EOMs, conjunctiva no swelling or erythema Sinuses: No Frontal/maxillary tenderness ENT/Mouth: Ext aud canals clear, TMs without erythema, bulging. No erythema, swelling, or exudate on post pharynx.  Tonsils not swollen or erythematous. Hearing normal.  Neck: Supple, thyroid normal.  Respiratory: Respiratory effort normal, BS equal bilaterally without rales, rhonchi, wheezing or stridor.  Cardio: RRR with no MRGs. Brisk peripheral pulses without edema.  Abdomen: Soft, + BS.  Non tender, no guarding, rebound, hernias, masses. Lymphatics: Non tender without lymphadenopathy.  Musculoskeletal: Full ROM, 5/5 strength, Ambulates with walker gait Skin: Warm, dry without rashes, lesions, ecchymosis.  Neuro: Cranial nerves intact. No cerebellar symptoms.  Psych: Awake and oriented X 3, normal affect, Insight and Judgment appropriate.    Izora Ribas, NP 9:37 AM [facility]

## 2017-11-04 ENCOUNTER — Encounter: Payer: Self-pay | Admitting: Adult Health

## 2017-11-04 ENCOUNTER — Ambulatory Visit: Payer: Medicare Other | Admitting: Adult Health

## 2017-11-04 VITALS — BP 124/60 | HR 71 | Temp 97.5°F | Ht 58.5 in | Wt 124.2 lb

## 2017-11-04 DIAGNOSIS — E1122 Type 2 diabetes mellitus with diabetic chronic kidney disease: Secondary | ICD-10-CM

## 2017-11-04 DIAGNOSIS — Z79899 Other long term (current) drug therapy: Secondary | ICD-10-CM | POA: Diagnosis not present

## 2017-11-04 DIAGNOSIS — N184 Chronic kidney disease, stage 4 (severe): Secondary | ICD-10-CM | POA: Diagnosis not present

## 2017-11-04 DIAGNOSIS — E559 Vitamin D deficiency, unspecified: Secondary | ICD-10-CM | POA: Diagnosis not present

## 2017-11-04 DIAGNOSIS — I10 Essential (primary) hypertension: Secondary | ICD-10-CM

## 2017-11-04 DIAGNOSIS — E782 Mixed hyperlipidemia: Secondary | ICD-10-CM | POA: Diagnosis not present

## 2017-11-04 MED ORDER — TRIAMCINOLONE ACETONIDE 0.1 % EX OINT: TOPICAL_OINTMENT | CUTANEOUS | 3 refills | Status: DC

## 2017-11-04 NOTE — Patient Instructions (Signed)
We recommend that you continue to push fluids - 80+ fluid ounces daily of water and avoid aleve/ibuprofen for your kidneys.    Chronic Kidney Disease, Adult Chronic kidney disease (CKD) occurs when the kidneys become damaged slowly over a long period of time. The kidneys are a pair of organs that do many important jobs in the body, including:  Removing waste and extra fluid from the blood to make urine.  Making hormones that maintain the amount of fluid in tissues and blood vessels.  Maintaining the right amount of fluids and chemicals in the body.  A small amount of kidney damage may not cause problems, but a large amount of damage may make it hard or impossible for the kidneys to work the way they should. If steps are not taken to slow down kidney damage or to stop it from getting worse, the kidneys may stop working permanently (end-stage renal disease or ESRD). Most of the time, CKD does not go away, but it can often be controlled. People who have CKD are usually able to live normal lives. What are the causes? The most common causes of this condition are diabetes and high blood pressure (hypertension). Other causes include:  Heart and blood vessel (cardiovascular) disease.  Kidney diseases, such as: ? Glomerulonephritis. ? Interstitial nephritis. ? Polycystic kidney disease. ? Renal vascular disease.  Diseases that affect the immune system.  Genetic diseases.  Medicines that damage the kidneys, such as anti-inflammatory medicines.  Being around or being in contact with poisonous (toxic) substances.  A kidney or urinary infection that occurs again and again (recurs).  Vasculitis. This is swelling or inflammation of the blood vessels.  A problem with urine flow that may be caused by: ? Cancer. ? Having kidney stones more than one time. ? An enlarged prostate, in males.  What increases the risk? You are more likely to develop this condition if you:  Are older than age  54.  Are female.  Are African-American, Hispanic, Asian, [place], or American [place].  Are a current or former smoker.  Are obese.  Have a family history of kidney disease or failure.  Often take medicines that are damaging to the kidneys.  What are the signs or symptoms? Symptoms of this condition include:  Swelling (edema) of the face, legs, ankles, or feet.  Tiredness (lethargy) and having less energy.  Nausea or vomiting.  Confusion or trouble concentrating.  Problems with urination, such as: ? Painful or burning feeling during urination. ? Decreased urine production. ? Frequent urination, especially at night. ? Bloody urine.  Muscle twitches and cramps, especially in the legs.  Shortness of breath.  Weakness.  Loss of appetite.  Metallic taste in the mouth.  Trouble sleeping.  Dry, itchy skin.  A low blood count (anemia).  Pale lining of the eyelids and surface of the eye (conjunctiva).  Symptoms develop slowly and may not be obvious until the kidney damage becomes severe. It is possible to have kidney disease for years without having any symptoms. How is this diagnosed? This condition may be diagnosed based on:  Blood tests.  Urine tests.  Imaging tests, such as an ultrasound or CT scan.  A test in which a sample of tissue is removed from the kidneys to be examined under a microscope (kidney biopsy).  These test results will help your health care provider determine how serious the CKD is. How is this treated? There is no cure for most cases of this condition, but treatment  usually relieves symptoms and prevents or slows the progression of the disease. Treatment may include:  Making diet changes, which may require you to avoid alcohol, salty foods (sodium), and foods that are high in potassium, calcium, and protein.  Medicines: ? To lower blood pressure. ? To control blood glucose. ? To relieve anemia. ? To relieve swelling. ? To  protect your bones. ? To improve the balance of electrolytes in your blood.  Removing toxic waste from the body through types of dialysis, if the kidneys can no longer do their job (kidney failure).  Managing any other conditions that are causing your CKD or making it worse.  Follow these instructions at home: Medicines  Take over-the-counter and prescription medicines only as told by your health care provider. The dose of some medicines that you take may need to be adjusted.  Do not take any new medicines unless approved by your health care provider. Many medicines can worsen your kidney damage.  Do not take any vitamin and mineral supplements unless approved by your health care provider. Many nutritional supplements can worsen your kidney damage. General instructions  Follow your prescribed diet as told by your health care provider.  Do not use any products that contain nicotine or tobacco, such as cigarettes and e-cigarettes. If you need help quitting, ask your health care provider.  Monitor and track your blood pressure at home. Report changes in your blood pressure as told by your health care provider.  If you are being treated for diabetes, monitor and track your blood sugar (blood glucose) levels as told by your health care provider.  Maintain a healthy weight. If you need help with this, ask your health care provider.  Start or continue an exercise plan. Exercise at least 30 minutes a day, 5 days a week.  Keep your immunizations up to date as told by your health care provider.  Keep all follow-up visits as told by your health care provider. This is important. Where to find more information:  American Association of Kidney Patients: BombTimer.gl  National Kidney Foundation: www.kidney.Mila Doce: https://mathis.com/  [facility]: www.lifeoptions.org and www.kidneyschool.org Contact a health care provider if:  Your symptoms get  worse.  You develop new symptoms. Get help right away if:  You develop symptoms of ESRD, which include: ? Headaches. ? Numbness in the hands or feet. ? Easy bruising. ? Frequent hiccups. ? Chest pain. ? Shortness of breath. ? Lack of menstruation, in women.  You have a fever.  You have decreased urine production.  You have pain or bleeding when you urinate. Summary  Chronic kidney disease (CKD) occurs when the kidneys become damaged slowly over a long period of time.  The most common causes of this condition are diabetes and high blood pressure (hypertension).  There is no cure for most cases of this condition, but treatment usually relieves symptoms and prevents or slows the progression of the disease. Treatment may include a combination of medicines and lifestyle changes. This information is not intended to replace advice given to you by your health care provider. Make sure you discuss any questions you have with your health care provider. Document Released: 08/04/2008 Document Revised: 12/03/2016 Document Reviewed: 12/03/2016 Elsevier Interactive Patient Education  [employer].

## 2017-11-05 LAB — BASIC METABOLIC PANEL WITH GFR
BUN/Creatinine Ratio: 20 (calc) (ref 6–22)
BUN: 38 mg/dL — AB (ref 7–25)
CO2: 25 mmol/L (ref 20–32)
CREATININE: 1.93 mg/dL — AB (ref 0.60–0.88)
Calcium: 9.9 mg/dL (ref 8.6–10.4)
Chloride: 103 mmol/L (ref 98–110)
GFR, EST NON AFRICAN AMERICAN: 22 mL/min/{1.73_m2} — AB (ref >=60)
GFR, Est African American: 26 mL/min/{1.73_m2} — ABNORMAL LOW (ref >=60)
Glucose, Bld: 109 mg/dL — ABNORMAL HIGH (ref 65–99)
Potassium: 5.2 mmol/L (ref 3.5–5.3)
SODIUM: 138 mmol/L (ref 135–146)

## 2017-11-05 LAB — LIPID PANEL
CHOL/HDL RATIO: 5.1 (calc) — AB (ref <5.0)
Cholesterol: 308 mg/dL — ABNORMAL HIGH (ref <200)
HDL: 60 mg/dL (ref >50)
LDL Cholesterol (Calc): 209 mg/dL (calc) — ABNORMAL HIGH
NON-HDL CHOLESTEROL (CALC): 248 mg/dL — AB (ref <130)
Triglycerides: 201 mg/dL — ABNORMAL HIGH (ref <150)

## 2017-11-05 LAB — CBC WITH DIFFERENTIAL/PLATELET
BASOS ABS: 40 {cells}/uL (ref 0–200)
BASOS PCT: 0.5 %
EOS ABS: 176 {cells}/uL (ref 15–500)
Eosinophils Relative: 2.2 %
HEMATOCRIT: 34.4 % — AB (ref 35.0–45.0)
HEMOGLOBIN: 11.5 g/dL — AB (ref 11.7–15.5)
LYMPHS ABS: 2368 {cells}/uL (ref 850–3900)
MCH: 31 pg (ref 27.0–33.0)
MCHC: 33.4 g/dL (ref 32.0–36.0)
MCV: 92.7 fL (ref 80.0–100.0)
MPV: 11.2 fL (ref 7.5–12.5)
Monocytes Relative: 8.2 %
NEUTROS ABS: 4760 {cells}/uL (ref 1500–7800)
Neutrophils Relative %: 59.5 %
Platelets: 185 10*3/uL (ref 140–400)
RBC: 3.71 10*6/uL — ABNORMAL LOW (ref 3.80–5.10)
RDW: 12.2 % (ref 11.0–15.0)
Total Lymphocyte: 29.6 %
WBC: 8 10*3/uL (ref 3.8–10.8)
WBCMIX: 656 {cells}/uL (ref 200–950)

## 2017-11-05 LAB — HEPATIC FUNCTION PANEL
AG RATIO: 1.7 (calc) (ref 1.0–2.5)
ALKALINE PHOSPHATASE (APISO): 56 U/L (ref 33–130)
ALT: 8 U/L (ref 6–29)
AST: 15 U/L (ref 10–35)
Albumin: 4.5 g/dL (ref 3.6–5.1)
BILIRUBIN INDIRECT: 0.4 mg/dL (ref 0.2–1.2)
BILIRUBIN TOTAL: 0.5 mg/dL (ref 0.2–1.2)
Bilirubin, Direct: 0.1 mg/dL (ref 0.0–0.2)
GLOBULIN: 2.7 g/dL (ref 1.9–3.7)
TOTAL PROTEIN: 7.2 g/dL (ref 6.1–8.1)

## 2017-11-05 LAB — TSH: TSH: 1.88 m[IU]/L (ref 0.40–4.50)

## 2017-11-05 LAB — VITAMIN D 25 HYDROXY (VIT D DEFICIENCY, FRACTURES): VIT D 25 HYDROXY: 76 ng/mL (ref 30–100)

## 2017-11-05 LAB — HEMOGLOBIN A1C
EAG (MMOL/L): 7.1 (calc)
Hgb A1c MFr Bld: 6.1 % of total Hgb — ABNORMAL HIGH (ref <5.7)
MEAN PLASMA GLUCOSE: 128 (calc)

## 2017-12-07 ENCOUNTER — Other Ambulatory Visit: Payer: Self-pay

## 2017-12-07 MED ORDER — ENALAPRIL MALEATE 20 MG PO TABS: 20.0000 mg | ORAL_TABLET | Freq: Every day | ORAL | 1 refills | Status: DC

## 2018-02-03 ENCOUNTER — Other Ambulatory Visit: Payer: Self-pay | Admitting: *Deleted

## 2018-02-03 MED ORDER — ATENOLOL 50 MG PO TABS: ORAL_TABLET | ORAL | 1 refills | Status: DC

## 2018-02-10 NOTE — Progress Notes (Signed)
[facility] Unk Pinto, M.D.     Uvaldo Bristle. Silverio Lay, P.A.-C Liane Comber, [place] [address] [place] [address] Telephone [phone] Telefax [phone] Annual Screening/Preventative Visit & Comprehensive Evaluation &  Examination     This very nice 84 y.o. [facility] presents for a Screening/Preventative Visit & comprehensive evaluation and management of multiple medical co-morbidities.  Patient has been followed for HTN, HLD, T2_NIDDM/CKD3  and Vitamin D Deficiency.     Patient relates shot of "cortisone" several months ago to Rt hand by Dr Amedeo Plenty with pains resolved. Also,  seen by Dr Veverly Fells recently for Lt shoulder pain with upcoming f/u.       HTN predates since 2007. Patient's BP has been controlled at home and patient denies any cardiac symptoms as chest pain, palpitations, shortness of breath, dizziness or ankle swelling. Today's BP is at goal - 136/72.      Patient's hyperlipidemia is not controlled with diet and medications deferred for age.  Last lipids were not at goal: Lab Results  Component Value Date   CHOL 308 (H) 11/04/2017   HDL 60 11/04/2017   LDLCALC 209 (H) 11/04/2017   TRIG 201 (H) 11/04/2017   CHOLHDL 5.1 (H) 11/04/2017      Patient has T2_NIDDM (A1c 6.5%/2016) managing with diet and w/CKD3 (GFR 38) and patient denies reactive hypoglycemic symptoms, visual blurring, diabetic polys, or paresthesias. Last A1c was  Lab Results  Component Value Date   HGBA1C 6.1 (H) 11/04/2017      Finally, patient has history of Vitamin D Deficiency and last Vitamin D was at goal: Lab Results  Component Value Date   VD25OH 76 11/04/2017   Current Outpatient Medications on File Prior to Visit  Medication Sig  . atenolol (TENORMIN) 50 MG tablet TAKE 1/2 -1 TAB AT BEDTIME FOR BLOOD PRESSURE  . Cholecalciferol (VITAMIN D) 2000 UNITS CAPS Take 6,000 Units by mouth daily.  . enalapril  (VASOTEC) 20 MG tablet Take 1 tablet (20 mg total) by mouth daily.  . Magnesium 250 MG TABS Take by mouth 2 (two) times daily.  Marland Kitchen triamcinolone ointment (KENALOG) 0.1 % Apply to rash 2-3 x daily as needed   No current facility-administered medications on file prior to visit.    No Known Allergies Past Medical History:  Diagnosis Date  . Arthritis   . HLD (hyperlipidemia)   . HTN (hypertension)    echo (11/11) with EF 55-60%, mild LV hypertrophy, PA systolic pressure 34 mmHg  . Low back pain   . Pre-diabetes    diet controlled  . Unspecified vitamin D deficiency    Health Maintenance  Topic Date Due  . DEXA SCAN  05/10/2019 (Originally 06/03/1992)  . HEMOGLOBIN A1C  05/05/2018  . INFLUENZA VACCINE  06/09/2018  . OPHTHALMOLOGY EXAM  09/17/2018  . FOOT EXAM  02/11/2019  . TETANUS/TDAP  09/21/2022  . PNA vac Low Risk Adult  Completed   Immunization History  Administered Date(s) Administered  . Influenza Split 10/11/2013  . Influenza, High Dose Seasonal PF 09/18/2014, 09/20/2015, 08/04/2017  . Influenza, Seasonal, Injecte, Preservative Fre 10/15/2016  . Influenza-Unspecified 10/10/2012  . Pneumococcal Conjugate-13 12/19/2014  . Pneumococcal Polysaccharide-23 10/11/2013  . Tdap 09/21/2012   Last Colon - 2008 - Dr Amedeo Plenty - aged out  Last MGM - 01/21/2016 recc 2 year f/u  Past Surgical History:  Procedure Laterality Date  . ABDOMINAL HYSTERECTOMY     partial  . abnormal EKG    .  BACK SURGERY    . cataract surgery    . colonoscopsy  2008   Neg  Dr Amedeo Plenty  . EYE SURGERY Bilateral    cataract  . JOINT REPLACEMENT     left hip replacement , right knee replacement   . lexiscan myoview  11/11   EF 81%, normal perfusion images suggesting no ischemia or infarction  . SPINE SURGERY     lumbar  . surgery for DDD    . TOTAL HIP ARTHROPLASTY  12/02/2012   Procedure: TOTAL HIP ARTHROPLASTY;  Surgeon: Gearlean Alf, MD;  Location: [facility];  Service: Orthopedics;  Laterality: Right;    Family History  Problem Relation Age of Onset  . Diabetes Unknown        DM - sibiling   . Coronary artery disease Neg Hx        no premature CAd   . Heart disease Mother    Social History   Tobacco Use  . Smoking status: Former Smoker    Last attempt to quit: 11/09/1970    Years since quitting: 47.2  . Smokeless tobacco: Never Used  Substance Use Topics  . Alcohol use: Yes    Comment: occasional   . Drug use: No    ROS Constitutional: Denies fever, chills, weight loss/gain, headaches, insomnia,  night sweats, and change in appetite. Does c/o fatigue. Eyes: Denies redness, blurred vision, diplopia, discharge, itchy, watery eyes.  ENT: Denies discharge, congestion, post nasal drip, epistaxis, sore throat, earache, hearing loss, dental pain, Tinnitus, Vertigo, Sinus pain, snoring.  Cardio: Denies chest pain, palpitations, irregular heartbeat, syncope, dyspnea, diaphoresis, orthopnea, PND, claudication, edema Respiratory: denies cough, dyspnea, DOE, pleurisy, hoarseness, laryngitis, wheezing.  Gastrointestinal: Denies dysphagia, heartburn, reflux, water brash, pain, cramps, nausea, vomiting, bloating, diarrhea, constipation, hematemesis, melena, hematochezia, jaundice, hemorrhoids Genitourinary: Denies dysuria, frequency, urgency, nocturia, hesitancy, discharge, hematuria, flank pain Breast: Breast lumps, nipple discharge, bleeding.  Musculoskeletal: Denies arthralgia, myalgia, stiffness, Jt. Swelling, pain, limp, and strain/sprain. Denies falls. Skin: Denies puritis, rash, hives, warts, acne, eczema, changing in skin lesion Neuro: No weakness, tremor, incoordination, spasms, paresthesia, pain Psychiatric: Denies confusion, memory loss, sensory loss. Denies Depression. Endocrine: Denies change in weight, skin, hair change, nocturia, and paresthesia, diabetic polys, visual blurring, hyper / hypo glycemic episodes.  Heme/Lymph: No excessive bleeding, bruising, enlarged lymph  nodes.  Physical Exam  BP 136/72   Pulse 68   Temp (!) 97.5 F (36.4 C)   Resp 16   Ht 4' 10.5" (1.486 m)   Wt 123 lb (55.8 kg)   BMI 25.27 kg/m   General Appearance: Well nourished, well groomed and in no apparent distress.  Eyes: PERRLA, EOMs, conjunctiva no swelling or erythema, normal fundi and vessels. Sinuses: No frontal/maxillary tenderness ENT/Mouth: EACs patent / TMs  nl. Nares clear without erythema, swelling, mucoid exudates. Oral hygiene is good. No erythema, swelling, or exudate. Tongue normal, non-obstructing. Tonsils not swollen or erythematous. Hearing normal.  Neck: Supple, thyroid not palpable. No bruits, nodes or JVD. Respiratory: Respiratory effort normal.  BS equal and clear bilateral without rales, rhonci, wheezing or stridor. Cardio: Heart sounds are normal with regular rate and rhythm and no murmurs, rubs or gallops. Peripheral pulses are normal and equal bilaterally without edema. No aortic or femoral bruits. Chest: symmetric with normal excursions and percussion. Breasts: Symmetric, without lumps, nipple discharge, retractions, or fibrocystic changes.  Abdomen: Flat, soft with bowel sounds active. Nontender, no guarding, rebound, hernias, masses, or organomegaly.  Lymphatics: Non tender without  lymphadenopathy.  Genitourinary:  Musculoskeletal: Full ROM generalized decrease in muscle power, tone & bulk. and  Gait stabilized with a rolling walker. Skin: Warm and dry without rashes, lesions, cyanosis, clubbing or  ecchymosis.  Neuro: Cranial nerves intact, reflexes equal bilaterally. Normal muscle tone, no cerebellar symptoms. Sensation intact.  Pysch: Alert and oriented X 3, normal affect, Insight and Judgment appropriate.   Assessment and Plan  1. Annual Preventative Screening Examination  2. Essential hypertension  - EKG 12-Lead - Urinalysis, Routine w reflex microscopic - Microalbumin / creatinine urine ratio - CBC with Differential/Platelet -  BASIC METABOLIC PANEL WITH GFR - Magnesium - TSH  3. Hyperlipidemia, mixed  - EKG 12-Lead - Hepatic function panel  4. Type 2 diabetes mellitus with stage 3 chronic kidney disease, without long-term current use of insulin (HCC)  - EKG 12-Lead - Urinalysis, Routine w reflex microscopic - Microalbumin / creatinine urine ratio - HM DIABETES FOOT EXAM - LOW EXTREMITY NEUR EXAM DOCUM - Hemoglobin A1c - Insulin, random  5. Vitamin D deficiency  - VITAMIN D 25 Hydroxyl  6. Screening for colorectal cancer  - POC Hemoccult Bld/Stl (3-Cd Home Screen); Future  7. Screening for ischemic heart disease  - EKG 12-Lead  8. Medication management  - Urinalysis, Routine w reflex microscopic - Microalbumin / creatinine urine ratio - CBC with Differential/Platelet - BASIC METABOLIC PANEL WITH GFR - Hepatic function panel - Magnesium - Hemoglobin A1c - Insulin, random - VITAMIN D 25 Hydroxyl - TSH          Patient was counseled in prudent diet to achieve/maintain BMI less than 25 for weight control, BP monitoring, regular exercise and medications. Discussed med's effects and SE's. Screening labs and tests as requested with regular follow-up as recommended. Over 40 minutes of exam, counseling, chart review and high complex critical decision making was performed.

## 2018-02-10 NOTE — Patient Instructions (Signed)

## 2018-02-11 ENCOUNTER — Ambulatory Visit: Payer: Medicare Other | Admitting: Internal Medicine

## 2018-02-11 ENCOUNTER — Encounter: Payer: Self-pay | Admitting: Internal Medicine

## 2018-02-11 VITALS — BP 136/72 | HR 68 | Temp 97.5°F | Resp 16 | Ht 58.5 in | Wt 123.0 lb

## 2018-02-11 DIAGNOSIS — Z79899 Other long term (current) drug therapy: Secondary | ICD-10-CM

## 2018-02-11 DIAGNOSIS — Z1211 Encounter for screening for malignant neoplasm of colon: Secondary | ICD-10-CM

## 2018-02-11 DIAGNOSIS — E559 Vitamin D deficiency, unspecified: Secondary | ICD-10-CM

## 2018-02-11 DIAGNOSIS — Z0000 Encounter for general adult medical examination without abnormal findings: Secondary | ICD-10-CM

## 2018-02-11 DIAGNOSIS — E782 Mixed hyperlipidemia: Secondary | ICD-10-CM

## 2018-02-11 DIAGNOSIS — I10 Essential (primary) hypertension: Secondary | ICD-10-CM

## 2018-02-11 DIAGNOSIS — E1122 Type 2 diabetes mellitus with diabetic chronic kidney disease: Secondary | ICD-10-CM

## 2018-02-11 DIAGNOSIS — Z136 Encounter for screening for cardiovascular disorders: Secondary | ICD-10-CM | POA: Diagnosis not present

## 2018-02-11 DIAGNOSIS — Z0001 Encounter for general adult medical examination with abnormal findings: Secondary | ICD-10-CM

## 2018-02-11 DIAGNOSIS — Z1212 Encounter for screening for malignant neoplasm of rectum: Secondary | ICD-10-CM

## 2018-02-11 DIAGNOSIS — N183 Chronic kidney disease, stage 3 (moderate): Secondary | ICD-10-CM

## 2018-02-11 LAB — TSH: TSH: 1.69 m[IU]/L (ref 0.40–4.50)

## 2018-02-14 LAB — HEPATIC FUNCTION PANEL
AG RATIO: 1.5 (calc) (ref 1.0–2.5)
ALBUMIN MSPROF: 4.4 g/dL (ref 3.6–5.1)
ALT: 9 U/L (ref 6–29)
AST: 14 U/L (ref 10–35)
Alkaline phosphatase (APISO): 63 U/L (ref 33–130)
BILIRUBIN INDIRECT: 0.3 mg/dL (ref 0.2–1.2)
Bilirubin, Direct: 0.1 mg/dL (ref 0.0–0.2)
GLOBULIN: 2.9 g/dL (ref 1.9–3.7)
TOTAL PROTEIN: 7.3 g/dL (ref 6.1–8.1)
Total Bilirubin: 0.4 mg/dL (ref 0.2–1.2)

## 2018-02-14 LAB — CBC WITH DIFFERENTIAL/PLATELET
Basophils Absolute: 47 cells/uL (ref 0–200)
Basophils Relative: 0.6 %
EOS ABS: 237 {cells}/uL (ref 15–500)
Eosinophils Relative: 3 %
HCT: 34.5 % — ABNORMAL LOW (ref 35.0–45.0)
HEMOGLOBIN: 11.9 g/dL (ref 11.7–15.5)
Lymphs Abs: 2986 cells/uL (ref 850–3900)
MCH: 31.9 pg (ref 27.0–33.0)
MCHC: 34.5 g/dL (ref 32.0–36.0)
MCV: 92.5 fL (ref 80.0–100.0)
MONOS PCT: 8.1 %
MPV: 11.4 fL (ref 7.5–12.5)
NEUTROS ABS: 3990 {cells}/uL (ref 1500–7800)
Neutrophils Relative %: 50.5 %
Platelets: 194 10*3/uL (ref 140–400)
RBC: 3.73 10*6/uL — AB (ref 3.80–5.10)
RDW: 12.5 % (ref 11.0–15.0)
Total Lymphocyte: 37.8 %
WBC: 7.9 10*3/uL (ref 3.8–10.8)
WBCMIX: 640 {cells}/uL (ref 200–950)

## 2018-02-14 LAB — VITAMIN D 25 HYDROXY (VIT D DEFICIENCY, FRACTURES): VIT D 25 HYDROXY: 67 ng/mL (ref 30–100)

## 2018-02-14 LAB — BASIC METABOLIC PANEL WITH GFR
BUN / CREAT RATIO: 22 (calc) (ref 6–22)
BUN: 42 mg/dL — AB (ref 7–25)
CALCIUM: 9.9 mg/dL (ref 8.6–10.4)
CO2: 26 mmol/L (ref 20–32)
CREATININE: 1.93 mg/dL — AB (ref 0.60–0.88)
Chloride: 102 mmol/L (ref 98–110)
GFR, Est African American: 26 mL/min/{1.73_m2} — ABNORMAL LOW (ref >=60)
GFR, Est Non African American: 22 mL/min/{1.73_m2} — ABNORMAL LOW (ref >=60)
GLUCOSE: 122 mg/dL — AB (ref 65–99)
Potassium: 4.7 mmol/L (ref 3.5–5.3)
Sodium: 136 mmol/L (ref 135–146)

## 2018-02-14 LAB — MAGNESIUM: MAGNESIUM: 2.6 mg/dL — AB (ref 1.5–2.5)

## 2018-02-14 LAB — HEMOGLOBIN A1C
EAG (MMOL/L): 7 (calc)
HEMOGLOBIN A1C: 6 %{Hb} — AB (ref <5.7)
Mean Plasma Glucose: 126 (calc)

## 2018-02-14 LAB — INSULIN, RANDOM: Insulin: 5.9 u[IU]/mL (ref 2.0–19.6)

## 2018-03-18 ENCOUNTER — Other Ambulatory Visit: Payer: Self-pay | Admitting: Internal Medicine

## 2018-03-18 DIAGNOSIS — I2510 Atherosclerotic heart disease of native coronary artery without angina pectoris: Secondary | ICD-10-CM

## 2018-03-18 DIAGNOSIS — N184 Chronic kidney disease, stage 4 (severe): Secondary | ICD-10-CM

## 2018-03-18 DIAGNOSIS — I10 Essential (primary) hypertension: Secondary | ICD-10-CM

## 2018-03-22 ENCOUNTER — Encounter (HOSPITAL_COMMUNITY): Payer: Self-pay

## 2018-04-26 ENCOUNTER — Encounter: Payer: Self-pay | Admitting: Physician Assistant

## 2018-04-26 ENCOUNTER — Ambulatory Visit: Payer: Medicare Other | Admitting: Physician Assistant

## 2018-04-26 VITALS — BP 145/66 | HR 67 | Ht <= 58 in | Wt 121.8 lb

## 2018-04-26 DIAGNOSIS — R7303 Prediabetes: Secondary | ICD-10-CM | POA: Diagnosis not present

## 2018-04-26 DIAGNOSIS — I10 Essential (primary) hypertension: Secondary | ICD-10-CM

## 2018-04-26 DIAGNOSIS — N184 Chronic kidney disease, stage 4 (severe): Secondary | ICD-10-CM

## 2018-04-26 DIAGNOSIS — Z79899 Other long term (current) drug therapy: Secondary | ICD-10-CM | POA: Diagnosis not present

## 2018-04-26 DIAGNOSIS — E785 Hyperlipidemia, unspecified: Secondary | ICD-10-CM

## 2018-04-26 DIAGNOSIS — M25512 Pain in left shoulder: Secondary | ICD-10-CM | POA: Diagnosis not present

## 2018-04-26 DIAGNOSIS — R0609 Other forms of dyspnea: Secondary | ICD-10-CM | POA: Diagnosis not present

## 2018-04-26 LAB — CBC
HEMOGLOBIN: 12.2 g/dL (ref 11.1–15.9)
Hematocrit: 35.2 % (ref 34.0–46.6)
MCH: 32.7 pg (ref 26.6–33.0)
MCHC: 34.7 g/dL (ref 31.5–35.7)
MCV: 94 fL (ref 79–97)
Platelets: 255 10*3/uL (ref 150–450)
RBC: 3.73 x10E6/uL — AB (ref 3.77–5.28)
RDW: 12.5 % (ref 12.3–15.4)
WBC: 8 10*3/uL (ref 3.4–10.8)

## 2018-04-26 NOTE — Patient Instructions (Addendum)
Medication Instructions: Your physician recommends that you continue on your current medications as directed.    If you need a refill on your cardiac medications before your next appointment, please call your pharmacy.   Labwork: Your physician recommends that you return for lab work in: Today (CBC)   Procedures/Testing: Your physician has requested that you have an echocardiogram. Echocardiography is a painless test that uses sound waves to create images of your heart. It provides your doctor with information about the size and shape of your heart and how well your heart's chambers and valves are working. This procedure takes approximately one hour. There are no restrictions for this procedure. [address]   Follow-Up: Your physician wants you to follow-up in 1 month with Almyra Deforest, [place]   Special Instructions:    Thank you for choosing [facility]!!

## 2018-04-26 NOTE — Progress Notes (Signed)
Cardiology Office Note    Date:  04/26/2018   ID:  Jeanette Espinoza, DOB [birth year], MRN [id number]  PCP:  Unk Pinto, MD  Cardiologist:  New -previously seen by Dr. Aundra Dubin in 2011  Chief Complaint  Patient presents with  . Shortness of Breath  . New Patient (Initial Visit)    eval for SOB. Referred by Dr. Melford Aase. Discussed with Dr. Gwenlyn Found    History of Present Illness:  Jeanette Espinoza is a 84 y.o. female with PMH of HTN, HLD, CKD stage IV and prediabetes.  Her mother passed away at age 47 from congestive heart failure.  Patient has been remotely seen by Dr. Aundra Dubin, the last visit was in November 2011.  She was previously evaluated by Dr. Aundra Dubin for abnormal EKG.  She herself has never been diagnosed with any cardiac issue.  Previous echocardiogram on 09/09/2010 which showed EF 55 to 60%, grade 1 DD, PA peak pressure 34 mmHg.  Lipid panel obtained in December 2018 showed a total cholesterol 308, HDL 60, LDL 209, triglyceride 201.  Myoview obtained in 09/2010 showed EF is 81%, normal perfusion image.  Patient presents today for new cardiology visit.  One of her issue is her left shoulder was hit by ironing board back in April and resulting in rotator cuff injury.  Dr. Veverly Fells of orthopedic is thinking about having a surgery and requested clearance prior to the surgery.  However patient says she has decided not to pursue the surgery.  She is mainly here in cardiology office for evaluation of dyspnea on exertion and fatigue in the past several months.  She denies any obvious chest pain.  EKG shows that she is developing a new left bundle branch block when compared today's EKG to her previous EKG in April 2019.  I did discuss the case with Dr. Gwenlyn Found, initially we recommended echocardiogram and Lexiscan stress test for cardiac clearance however after patient decided not to pursue surgery, I will proceed with initial echocardiogram assessment first.  If echocardiogram is abnormal, I would have low  threshold to proceed with a Lexiscan Myoview in light of new left bundle branch block and dyspnea on exertion which could be anginal equivalent.  On exam, she has no lower extremity edema, orthopnea or PND.   Past Medical History:  Diagnosis Date  . Arthritis   . HLD (hyperlipidemia)   . HTN (hypertension)    echo (11/11) with EF 55-60%, mild LV hypertrophy, PA systolic pressure 34 mmHg  . Low back pain   . Pre-diabetes    diet controlled  . Unspecified vitamin D deficiency     Past Surgical History:  Procedure Laterality Date  . ABDOMINAL HYSTERECTOMY     partial  . abnormal EKG    . BACK SURGERY    . cataract surgery    . colonoscopsy  2008   Neg  Dr Amedeo Plenty  . EYE SURGERY Bilateral    cataract  . JOINT REPLACEMENT     left hip replacement , right knee replacement   . lexiscan myoview  11/11   EF 81%, normal perfusion images suggesting no ischemia or infarction  . SPINE SURGERY     lumbar  . surgery for DDD    . TOTAL HIP ARTHROPLASTY  12/02/2012   Procedure: TOTAL HIP ARTHROPLASTY;  Surgeon: Gearlean Alf, MD;  Location: [facility];  Service: Orthopedics;  Laterality: Right;    Current Medications: Outpatient Medications Prior to Visit  Medication Sig Dispense Refill  .  atenolol (TENORMIN) 50 MG tablet TAKE 1/2 -1 TAB AT BEDTIME FOR BLOOD PRESSURE 90 tablet 1  . Cholecalciferol (VITAMIN D) 2000 UNITS CAPS Take 6,000 Units by mouth daily.    . enalapril (VASOTEC) 20 MG tablet Take 1 tablet (20 mg total) by mouth daily. 90 tablet 1  . Magnesium 250 MG TABS Take by mouth 2 (two) times daily.    Marland Kitchen triamcinolone ointment (KENALOG) 0.1 % Apply to rash 2-3 x daily as needed 30 g 3   No facility-administered medications prior to visit.      Allergies:   Patient has no known allergies.   Social History   Socioeconomic History  . Marital status: Single    Spouse name: Not on file  . Number of children: Not on file  . Years of education: Not on file  . Highest education  level: Not on file  Occupational History  . Not on file  Social Needs  . Financial resource strain: Not on file  . Food insecurity:    Worry: Not on file    Inability: Not on file  . Transportation needs:    Medical: Not on file    Non-medical: Not on file  Tobacco Use  . Smoking status: Former Smoker    Last attempt to quit: 11/09/1970    Years since quitting: 47.4  . Smokeless tobacco: Never Used  Substance and Sexual Activity  . Alcohol use: Yes    Comment: occasional   . Drug use: No  . Sexual activity: Not on file  Lifestyle  . Physical activity:    Days per week: Not on file    Minutes per session: Not on file  . Stress: Not on file  Relationships  . Social connections:    Talks on phone: Not on file    Gets together: Not on file    Attends religious service: Not on file    Active member of club or organization: Not on file    Attends meetings of clubs or organizations: Not on file    Relationship status: Not on file  Other Topics Concern  . Not on file  Social History Narrative   Retired, lives in [place]. Widowed   Daughter in [place]. [name] [phone])   Does not get regular exercise     Family History:  The patient's family history includes Diabetes in her unknown relative; Heart disease in her mother.   ROS:   Please see the history of present illness.    ROS All other systems reviewed and are negative.   PHYSICAL EXAM:   VS:  BP (!) 145/66 (BP Location: Right Arm)   Pulse 67   Ht 4\' 10"  (1.473 m)   Wt 121 lb 12.8 oz (55.2 kg)   BMI 25.46 kg/m    GEN: Well nourished, well developed, in no acute distress  HEENT: normal  Neck: no JVD, carotid bruits, or masses Cardiac: RRR; no murmurs, rubs, or gallops,no edema  Respiratory:  clear to auscultation bilaterally, normal work of breathing GI: soft, nontender, nondistended, + BS MS: no deformity or atrophy  Skin: warm and dry, no rash Neuro:  Alert and Oriented x 3, Strength and sensation are  intact Psych: euthymic mood, full affect  Wt Readings from Last 3 Encounters:  04/26/18 121 lb 12.8 oz (55.2 kg)  02/11/18 123 lb (55.8 kg)  11/04/17 124 lb 3.2 oz (56.3 kg)      Studies/Labs Reviewed:   EKG:  EKG is ordered today.  The ekg ordered today demonstrates normal sinus rhythm with left bundle branch block  Recent Labs: 02/11/2018: ALT 9; BUN 42; Creat 1.93; Hemoglobin 11.9; Magnesium 2.6; Platelets 194; Potassium 4.7; Sodium 136; TSH 1.69   Lipid Panel    Component Value Date/Time   CHOL 308 (H) 11/04/2017 0947   TRIG 201 (H) 11/04/2017 0947   HDL 60 11/04/2017 0947   CHOLHDL 5.1 (H) 11/04/2017 0947   VLDL 54 (H) 04/27/2017 0958   LDLCALC 209 (H) 11/04/2017 0947    Additional studies/ records that were reviewed today include:   Echo 09/09/2010 Study Conclusions  - Left ventricle: The cavity size was normal. Wall thickness was  increased in a pattern of mild LVH. Systolic function was normal.  The estimated ejection fraction was in the range of 55% to 60%.  Wall motion was normal; there were no regional wall motion  abnormalities. Doppler parameters are consistent with abnormal  left ventricular relaxation (grade 1 diastolic dysfunction). - Atrial septum: No defect or patent foramen ovale was identified. - Pulmonary arteries: PA peak pressure: 67mm Hg (S).   ASSESSMENT:    1. DOE (dyspnea on exertion)   2. Medication management   3. Essential hypertension   4. Hyperlipidemia, unspecified hyperlipidemia type   5. CKD (chronic kidney disease), stage IV ([place])   6. Prediabetes   7. Acute pain of left shoulder      PLAN:  In order of problems listed above:  1. Dyspnea on exertion: Patient has been having progressive dyspnea on exertion for the past several months, she denies any chest pain.  EKG showed a new left bundle branch block.  I will proceed with echocardiogram first, I would have very low threshold to proceed with a Lexiscan Myoview as  well to rule out anginal equivalent.  It is also possible that her shortness of breath is related to deconditioning and advanced age.  She is currently living in [facility] and does not seems to do much activity.  Obtain CBC to rule out secondary causes for dyspnea on exertion and fatigue.  2. Hypertension: Blood pressure mildly elevated.  However previous blood pressure was normal  3. Hyperlipidemia: Cholesterol very much uncontrolled, will need to consider addition of statin medication follow-up  4. CKD stage IV: Baseline creatinine 1.9.  5. Prediabetes: Monitored by primary care provider  6. Left shoulder pain: Seen by Dr. Veverly Fells, initially planned for left shoulder surgery, however it appears patient has changed her mind and decided not to pursue surgery.  If she were to have surgery in the future, she will need a Lexiscan Myoview for cardiac clearance.  Her age alone will put her at least moderate risk, however if there is any abnormality on the stress test or echocardiogram, that will place her at very high risk going through anesthesia.    Medication Adjustments/Labs and Tests Ordered: Current medicines are reviewed at length with the patient today.  Concerns regarding medicines are outlined above.  Medication changes, Labs and Tests ordered today are listed in the Patient Instructions below. Patient Instructions  Medication Instructions: Your physician recommends that you continue on your current medications as directed.    If you need a refill on your cardiac medications before your next appointment, please call your pharmacy.   Labwork: Your physician recommends that you return for lab work in: Today (CBC)   Procedures/Testing: Your physician has requested that you have an echocardiogram. Echocardiography is a painless test that uses sound waves to create images of  your heart. It provides your doctor with information about the size and shape of your heart and how well your  heart's chambers and valves are working. This procedure takes approximately one hour. There are no restrictions for this procedure. [address]   Follow-Up: Your physician wants you to follow-up in 1 month with Almyra Deforest, [place]   Special Instructions:    Thank you for choosing [facility] at [facility]!!       Signed, Almyra Deforest, [place]  04/26/2018 1:40 PM    [facility] [address], [place], [place]  [address] Phone: [phone]; Fax: [phone]

## 2018-04-27 NOTE — Progress Notes (Signed)
Red blood cell count normal

## 2018-05-02 ENCOUNTER — Telehealth: Payer: Self-pay | Admitting: *Deleted

## 2018-05-02 NOTE — Telephone Encounter (Signed)
Patient called and states she is not having the shoulder surgery and asked if she needs to keep the appointment for her ECHO cardiogram.  Per Dr Melford Aase, it is OK the cancel the cardiac appointments. The patient is aware.

## 2018-05-06 ENCOUNTER — Other Ambulatory Visit (HOSPITAL_COMMUNITY): Payer: Medicare Other

## 2018-06-02 ENCOUNTER — Ambulatory Visit: Payer: Medicare Other | Admitting: Physician Assistant

## 2018-06-08 NOTE — Progress Notes (Signed)
FOLLOW UP  Assessment and Plan:   Hypertension Well controlled with current medications  Monitor blood pressure at home; patient to call if consistently greater than 130/80 Continue DASH diet.   Reminder to go to the ER if any CP, SOB, nausea, dizziness, severe HA, changes vision/speech, left arm numbness and tingling and jaw pain.  Cholesterol Not at goal; treated by lifestyle without medications secondary to age  Continue low cholesterol diet and exercise.  Defer lipid panel.   Diabetes with diabetic chronic kidney disease Currently with A1Cs in prediabetic range off of medications Continue diet and exercise.  Perform daily foot/skin check, notify office of any concerning changes.  Check A1C, CMP with GFR  Stage 4 CKD secondary to diabetes  Increase fluids, avoid NSAIDS, monitor sugars, will monitor Discussed nephrology referral today; patient would like to defer in light of stable values, cautioned should she have diarrhea/emesis needs to let us know immediately for close monitoring  Vitamin D Def/ osteoporosis prevention At goal at recent check;  Defer vitamin D level   Continue diet and meds as discussed. Further disposition pending results of labs. Discussed med's effects and SE's.   Over 30 minutes of exam, counseling, chart review, and critical decision making was performed.   Future Appointments  Date Time Provider [place]  09/09/2018  9:30 AM Vicie Mutters, PA-C [facility] None  03/14/2019  9:00 AM Unk Pinto, MD [facility] None    ----------------------------------------------------------------------------------------------------------------------  HPI 84 y.o. Caucasian female who lives in assisted living community presents for 3 month follow up on hypertension, cholesterol, T2 diabetes, stable 4th stage CKD and vitamin D deficiency. Today she reports she is fighting an intermittent sore throat since 3 days ago without any other symptoms, began  after a family gathering for a birth. She declines any medications today.   BMI is Body mass index is 25.5 kg/m., she has been working on diet, was walking until it got hot, hasn't been feeling up to doing much exercise with current weather.  Wt Readings from Last 3 Encounters:  06/09/18 122 lb (55.3 kg)  04/26/18 121 lb 12.8 oz (55.2 kg)  02/11/18 123 lb (55.8 kg)   Her blood pressure has been controlled at home, today their BP is BP: 134/64  She does not workout - she is limited by ability to ambulate - she reports she does make an effort to walk daily with her walker.  She denies chest pain, shortness of breath, dizziness.   She is not on cholesterol medication secondary to age and denies myalgias. Her cholesterol is not at goal. The cholesterol last visit was:   Lab Results  Component Value Date   CHOL 308 (H) 11/04/2017   HDL 60 11/04/2017   LDLCALC 209 (H) 11/04/2017   TRIG 201 (H) 11/04/2017   CHOLHDL 5.1 (H) 11/04/2017    She has been working on diet and exercise for diet controlled T2 diabetes, and denies nausea, polydipsia, polyuria, visual disturbances, vomiting and weight loss. She has recently demonstrated improved A1Cs in prediabetic range. Last A1C in the office was:  Lab Results  Component Value Date   HGBA1C 6.0 (H) 02/11/2018   Patient is on Vitamin D supplement and was near goal of 70 at last visit:   Lab Results  Component Value Date   VD25OH 67 02/11/2018     She has stable stage 4 CKD secondary to diabetes:  Lab Results  Component Value Date   GFRNONAA 22 (L) 02/11/2018   GFRNONAA 22 (  L) 11/04/2017   GFRNONAA 26 (L) 08/04/2017    Current Medications:  Current Outpatient Medications on File Prior to Visit  Medication Sig  . atenolol (TENORMIN) 50 MG tablet TAKE 1/2 -1 TAB AT BEDTIME FOR BLOOD PRESSURE  . Cholecalciferol (VITAMIN D) 2000 UNITS CAPS Take 6,000 Units by mouth daily.  . enalapril (VASOTEC) 20 MG tablet Take 1 tablet (20 mg total) by mouth  daily.  . Magnesium 250 MG TABS Take by mouth 2 (two) times daily.  Marland Kitchen triamcinolone ointment (KENALOG) 0.1 % Apply to rash 2-3 x daily as needed   No current facility-administered medications on file prior to visit.      Allergies: No Known Allergies   Medical History:  Past Medical History:  Diagnosis Date  . Arthritis   . HLD (hyperlipidemia)   . HTN (hypertension)    echo (11/11) with EF 55-60%, mild LV hypertrophy, PA systolic pressure 34 mmHg  . Low back pain   . Pre-diabetes    diet controlled  . Unspecified vitamin D deficiency    Family history- Reviewed and unchanged Social history- Reviewed and unchanged   Review of Systems:  Review of Systems  Constitutional: Negative for malaise/fatigue and weight loss.  HENT: Positive for sore throat. Negative for congestion, hearing loss and tinnitus.   Eyes: Negative for blurred vision and double vision.  Respiratory: Negative for cough, sputum production, shortness of breath and wheezing.   Cardiovascular: Negative for chest pain, palpitations, orthopnea, claudication and leg swelling.  Gastrointestinal: Negative for abdominal pain, blood in stool, constipation, diarrhea, heartburn, melena, nausea and vomiting.  Genitourinary: Negative.   Musculoskeletal: Negative for joint pain and myalgias.  Skin: Negative for rash.  Neurological: Negative for dizziness, tingling, sensory change, weakness and headaches.  Endo/Heme/Allergies: Negative for polydipsia.  Psychiatric/Behavioral: Negative.   All other systems reviewed and are negative.    Physical Exam: BP 134/64   Pulse 66   Temp 97.7 F (36.5 C)   Ht 4\' 10"  (1.473 m)   Wt 122 lb (55.3 kg)   SpO2 98%   BMI 25.50 kg/m  Wt Readings from Last 3 Encounters:  06/09/18 122 lb (55.3 kg)  04/26/18 121 lb 12.8 oz (55.2 kg)  02/11/18 123 lb (55.8 kg)   General Appearance: Well nourished, in no apparent distress. Eyes: PERRLA, EOMs, conjunctiva no swelling or  erythema Sinuses: No Frontal/maxillary tenderness ENT/Mouth: Ext aud canals clear, TMs without erythema, bulging. No erythema, swelling, or exudate on post pharynx.  Tonsils not swollen or erythematous. Hearing normal.  Neck: Supple, thyroid normal.  Respiratory: Respiratory effort normal, BS equal bilaterally without rales, rhonchi, wheezing or stridor.  Cardio: RRR with no MRGs. Brisk peripheral pulses without edema.  Abdomen: Soft, + BS.  Non tender, no guarding, rebound, hernias, masses. Lymphatics: Non tender without lymphadenopathy.  Musculoskeletal: Full ROM, symmetrical strength, Ambulates slowly with walker Skin: Warm, dry without rashes, lesions, ecchymosis.  Neuro: Cranial nerves intact. No cerebellar symptoms.  Psych: Awake and oriented X 3, normal affect, Insight and Judgment appropriate.    Izora Ribas, NP 11:20 AM [facility]

## 2018-06-09 ENCOUNTER — Encounter: Payer: Self-pay | Admitting: Adult Health

## 2018-06-09 ENCOUNTER — Ambulatory Visit (INDEPENDENT_AMBULATORY_CARE_PROVIDER_SITE_OTHER): Payer: Medicare Other | Admitting: Adult Health

## 2018-06-09 VITALS — BP 134/64 | HR 66 | Temp 97.7°F | Ht <= 58 in | Wt 122.0 lb

## 2018-06-09 DIAGNOSIS — N184 Chronic kidney disease, stage 4 (severe): Secondary | ICD-10-CM

## 2018-06-09 DIAGNOSIS — I10 Essential (primary) hypertension: Secondary | ICD-10-CM

## 2018-06-09 DIAGNOSIS — E1122 Type 2 diabetes mellitus with diabetic chronic kidney disease: Secondary | ICD-10-CM

## 2018-06-09 DIAGNOSIS — E559 Vitamin D deficiency, unspecified: Secondary | ICD-10-CM | POA: Diagnosis not present

## 2018-06-09 DIAGNOSIS — E782 Mixed hyperlipidemia: Secondary | ICD-10-CM

## 2018-06-09 DIAGNOSIS — Z79899 Other long term (current) drug therapy: Secondary | ICD-10-CM | POA: Diagnosis not present

## 2018-06-09 DIAGNOSIS — Z6825 Body mass index (BMI) 25.0-25.9, adult: Secondary | ICD-10-CM

## 2018-06-09 NOTE — Patient Instructions (Addendum)
Aim for 7+ servings of fruits and vegetables daily  65+ fluid ounces of water or unsweet tea for healthy kidneys  Limit animal fats in diet for cholesterol and heart health - choose grass fed whenever available  Aim for low stress - take time to unwind and care for your mental health  Aim for 150 min of moderate intensity exercise weekly for heart health, and weights twice weekly for bone health  Aim for 7-9 hours of sleep daily   Exercise Guidelines   It is important to design a program that is safe and effective for you. The program should meet your specific abilities and needs. Walking, biking, jogging, and swimming are all good aerobic activities. These take light to moderate effort. Adding some light resistance training is also important. Even simple lifestyle changes can help. These lifestyle changes may include parking farther from the store or taking the stairs instead of the elevator. At first, you may begin exercising under supervision, such as at a hospital or clinic. Over time, you may begin exercising at home, with your health care provider's approval.  Types of exercise Aerobic exercise During cardiac rehabilitation, it is important to do aerobic activities. Aerobic exercise keeps joints and muscles moving. It involves large muscle groups. It is also rhythmic and must be done for a longer period of time. Doing these exercises improves circulation and endurance. Examples of aerobic exercise include:  Swimming.  Walking.  Hiking.  Jogging.  Cross-country skiing.  Biking.  Dancing.  Static exercise Static exercise (isometric exercise) uses muscles at high intensities without moving the joints. Some examples of static exercise include pushing against a heavy couch that does not move, doing a wall sit, or holding a plank position. Static exercise improves strength but also quickly increases blood pressure. Follow these guidelines:  If you have circulation problems or  high blood pressure, talk with your health care provider before starting any static exercise routines. Do not do static exercises if your health care provider tells you not to.  Do not hold your breath while doing static exercises. Holding your breath during static exercises can raise your blood pressure to a dangerously high level.  Weight-resistance exercise Weight-resistance exercises are another important part of rehabilitation. These exercises strengthen your muscles by making them work against resistance. Resistance exercises may help you return to activities of daily living sooner and improve your quality of life. They also help reduce cardiac risk factors. Examples of weight-resistance exercise include using:  Free weights.  Weight-lifting machines.  Large, specially designed rubber bands.  You will usually do weight-resistance exercises 2 times a week with a 2-day rest period between workouts. Stretching Stretching before you exercise warms up your muscles and prevents injury. Stretching also improves your flexibility, balance, coordination, and range of motion. Follow these guidelines:  Stretch both before and after exercising.  Do not force a muscle or joint into a painful angle. Stretching should be a relaxing part of your exercise routine.  Once you feel resistance in your muscle, hold the stretch for a few seconds. Make sure you keep breathing while you hold the stretch.  Go slowly when doing all stretches.  Setting a pace  Choose a pace that is comfortable for you. ? You should be able to talk while exercising. If you are short of breath or unable to speak while you exercise, slow down. ? If you are able to sing while exercising, you are not exercising hard enough.  Keep track of how  hard you are working as you exercise (exertion level). Your rehabilitation therapist can teach you to use a mental scale to measure your level of exertion (perceived exertion). Using a  mental scale, you will think about your exertion level and rate it in a range from 6 to 20. ? A rating of 6 to 9. This means that you are doing "very light" exercise and are not exerting yourself enough. For a healthy person, this may be walking at a slow pace. ? A rating of 11 to 15. This is exercise that is "somewhat hard." For a healthy exercise session, you should aim for an exertion rate that is within this range. ? A rating of 16 to 17. This is considered "very hard" or strenuous. For a healthy person, exercise at this rating may start to feel heavy and difficult. ? A rating of 19 to 20. This means that you are working "extremely hard." For most people, these numbers represent the hardest you've ever worked to exercise.  Your health care provider or cardiac rehabilitation specialist may also recommend that you wear a heart rate monitor while you exercise. This will help you keep track of your heart rate zones and how hard your heart is working. Frequency As you are recovering, it is important to start exercising slowly and to gradually work up to your goal. Work with your health care provider to set up an exercise routine that works for you. Generally, cardiac rehabilitation exercise should include:  40 minutes of aerobic activity 3 - 4 days a week.  Stretching and strength exercises 2 - 3 days a week.  Contact a doctor if:  You have any of the following symptoms while exercising: ? Pain, pressure, or burning in your chest, jaw, shoulder, or back (angina). ? Lightheadedness. ? Dizziness. ? Irregular or fast heartbeat. ? Shortness of breath.  You are extremely tired after exercising. Get help right away if:  You have angina that does not get better with medicine and lasts for more than 5 minutes.  You have nausea or you vomit.  You have excessive sweating that is not caused by exercise. Summary  When you are recovering from a heart condition, it is important to have  heart-healthy habits, including exercise routines.  At first, you may begin exercising under supervision, such as at a hospital or clinic. Over time, you may begin exercising at home, with your health care provider's approval.  Aim for 40 minutes of aerobic exercises 3 - 4 days a week.  Aim to do stretching and strength exercises 2 - 3 days a week.  Choose a pace that is comfortable for you. You should be able to talk while exercising. This information is not intended to replace advice given to you by your health care provider. Make sure you discuss any questions you have with your health care provider. Document Released: 10/31/2013 Document Revised: 09/25/2016 Document Reviewed: 09/25/2016 Elsevier Interactive Patient Education  [employer].

## 2018-06-10 ENCOUNTER — Other Ambulatory Visit: Payer: Self-pay | Admitting: Adult Health

## 2018-06-10 DIAGNOSIS — E875 Hyperkalemia: Secondary | ICD-10-CM

## 2018-06-10 LAB — COMPLETE METABOLIC PANEL WITH GFR
AG Ratio: 1.6 (calc) (ref 1.0–2.5)
ALBUMIN MSPROF: 4.6 g/dL (ref 3.6–5.1)
ALKALINE PHOSPHATASE (APISO): 66 U/L (ref 33–130)
ALT: 7 U/L (ref 6–29)
AST: 13 U/L (ref 10–35)
BUN / CREAT RATIO: 16 (calc) (ref 6–22)
BUN: 32 mg/dL — AB (ref 7–25)
CO2: 24 mmol/L (ref 20–32)
CREATININE: 1.97 mg/dL — AB (ref 0.60–0.88)
Calcium: 9.8 mg/dL (ref 8.6–10.4)
Chloride: 97 mmol/L — ABNORMAL LOW (ref 98–110)
GFR, EST AFRICAN AMERICAN: 25 mL/min/{1.73_m2} — AB (ref >=60)
GFR, Est Non African American: 22 mL/min/{1.73_m2} — ABNORMAL LOW (ref >=60)
GLOBULIN: 2.8 g/dL (ref 1.9–3.7)
GLUCOSE: 102 mg/dL — AB (ref 65–99)
Potassium: 5.6 mmol/L — ABNORMAL HIGH (ref 3.5–5.3)
SODIUM: 131 mmol/L — AB (ref 135–146)
Total Bilirubin: 0.5 mg/dL (ref 0.2–1.2)
Total Protein: 7.4 g/dL (ref 6.1–8.1)

## 2018-06-10 LAB — CBC WITH DIFFERENTIAL/PLATELET
BASOS ABS: 22 {cells}/uL (ref 0–200)
BASOS PCT: 0.2 %
EOS ABS: 131 {cells}/uL (ref 15–500)
EOS PCT: 1.2 %
HEMATOCRIT: 33.1 % — AB (ref 35.0–45.0)
HEMOGLOBIN: 11.5 g/dL — AB (ref 11.7–15.5)
LYMPHS ABS: 2943 {cells}/uL (ref 850–3900)
MCH: 31.9 pg (ref 27.0–33.0)
MCHC: 34.7 g/dL (ref 32.0–36.0)
MCV: 91.7 fL (ref 80.0–100.0)
MPV: 11.3 fL (ref 7.5–12.5)
Monocytes Relative: 8.4 %
NEUTROS ABS: 6889 {cells}/uL (ref 1500–7800)
Neutrophils Relative %: 63.2 %
Platelets: 229 10*3/uL (ref 140–400)
RBC: 3.61 10*6/uL — ABNORMAL LOW (ref 3.80–5.10)
RDW: 11.7 % (ref 11.0–15.0)
Total Lymphocyte: 27 %
WBC mixed population: 916 cells/uL (ref 200–950)
WBC: 10.9 10*3/uL — ABNORMAL HIGH (ref 3.8–10.8)

## 2018-06-10 LAB — HEMOGLOBIN A1C
HEMOGLOBIN A1C: 6 %{Hb} — AB (ref <5.7)
MEAN PLASMA GLUCOSE: 126 (calc)
eAG (mmol/L): 7 (calc)

## 2018-06-10 LAB — TSH: TSH: 1.76 m[IU]/L (ref 0.40–4.50)

## 2018-06-14 ENCOUNTER — Ambulatory Visit: Payer: Medicare Other

## 2018-06-14 DIAGNOSIS — E875 Hyperkalemia: Secondary | ICD-10-CM | POA: Diagnosis not present

## 2018-06-14 LAB — BASIC METABOLIC PANEL WITH GFR
BUN / CREAT RATIO: 17 (calc) (ref 6–22)
BUN: 31 mg/dL — ABNORMAL HIGH (ref 7–25)
CO2: 23 mmol/L (ref 20–32)
Calcium: 9.6 mg/dL (ref 8.6–10.4)
Chloride: 98 mmol/L (ref 98–110)
Creat: 1.83 mg/dL — ABNORMAL HIGH (ref 0.60–0.88)
GFR, EST AFRICAN AMERICAN: 27 mL/min/{1.73_m2} — AB (ref >=60)
GFR, EST NON AFRICAN AMERICAN: 24 mL/min/{1.73_m2} — AB (ref >=60)
Glucose, Bld: 111 mg/dL — ABNORMAL HIGH (ref 65–99)
Potassium: 5.1 mmol/L (ref 3.5–5.3)
Sodium: 133 mmol/L — ABNORMAL LOW (ref 135–146)

## 2018-06-14 NOTE — Progress Notes (Signed)
Patient presents to the office for a nurse visit to have BP checked and a lab draw. BP taken in left arm and reading was 128/56. Patient did state that she has a cough and is producing green phlegm, no fever and has had the cough for 5 days. Not currently taking anything for the cough. States that she is starting to feel better. No other signs or symptoms.

## 2018-06-15 ENCOUNTER — Other Ambulatory Visit: Payer: Self-pay | Admitting: Adult Health

## 2018-06-15 MED ORDER — PREDNISONE 20 MG PO TABS: ORAL_TABLET | ORAL | 0 refills | Status: DC

## 2018-06-15 MED ORDER — PROMETHAZINE-DM 6.25-15 MG/5ML PO SYRP: 5.0000 mL | ORAL_SOLUTION | Freq: Four times a day (QID) | ORAL | 1 refills | Status: DC | PRN

## 2018-06-15 MED ORDER — ATENOLOL 50 MG PO TABS: ORAL_TABLET | ORAL | 1 refills | Status: DC

## 2018-06-15 MED ORDER — AZITHROMYCIN 250 MG PO TABS: ORAL_TABLET | ORAL | 1 refills | Status: AC

## 2018-06-15 NOTE — Progress Notes (Signed)
Patient reports nagging deep/hacky productive cough, not improving/resolving. Will send in zpak, prednisone taper, cough medication, emphasized to schedule OV if not improving. Go to ER if suddenly short of breath.

## 2018-07-11 ENCOUNTER — Other Ambulatory Visit: Payer: Self-pay | Admitting: Adult Health

## 2018-07-11 ENCOUNTER — Other Ambulatory Visit: Payer: Self-pay | Admitting: Internal Medicine

## 2018-07-14 ENCOUNTER — Ambulatory Visit: Payer: Medicare Other | Admitting: Internal Medicine

## 2018-07-14 VITALS — BP 158/80 | HR 80 | Temp 97.5°F | Resp 16 | Ht <= 58 in | Wt 122.0 lb

## 2018-07-14 DIAGNOSIS — M25571 Pain in right ankle and joints of right foot: Secondary | ICD-10-CM | POA: Diagnosis not present

## 2018-07-14 DIAGNOSIS — I10 Essential (primary) hypertension: Secondary | ICD-10-CM | POA: Diagnosis not present

## 2018-07-14 MED ORDER — PREDNISONE 10 MG PO TABS: ORAL_TABLET | ORAL | 1 refills | Status: DC

## 2018-07-14 NOTE — Progress Notes (Signed)
  Subjective:    Patient ID: Jeanette Espinoza, female    DOB: 12/21/1924, 84 y.o.   MRN: [id number]  HPI  This very nice 84 yo WWF presents with a 2-3 day prodrome of Rt foot pain. Denies injury. No limping.  Medication Sig  . atenolol 50 MG tablet Take 1/2 to 1 tablet daily.  Marland Kitchen VITAMIN D 2000 UNITS  Take 6,000 Units  daily.  . Magnesium 250 MG TABS Take  2 (two) times daily.  Marland Kitchen triamcinolone oint  0.1 % Apply to rash 2-3 x daily as needed  . atenolol 50 MG tablet Patient taking 25 mg daily.   . enalapril20 MG tablet TAKE 1 TABLET  ONCE DAILY   No Known Allergies   Past Medical History:  Diagnosis Date  . Arthritis   . HLD (hyperlipidemia)   . HTN (hypertension)    echo (11/11) with EF 55-60%, mild LV hypertrophy, PA systolic pressure 34 mmHg  . Low back pain   . Pre-diabetes    diet controlled  . Unspecified vitamin D deficiency       Review of Systems     Objective:   Physical Exam  BP (!) 158/80   Pulse 80   Temp (!) 97.5 F (36.4 C)   Resp 16   Ht 4\' 10"  (1.473 m)   Wt 122 lb (55.3 kg)   BMI 25.50 kg/m   HEENT - WNL. Neck - supple.  Chest - Clear equal BS. Cor - Nl HS. RRR w/o sig MGR. PP 1(+). No edema. MS- FROM w/o deformities.  Rt foot /ankle has norma appearance w/o deformity or STS evident. No erythema . Sl tender over the dorsal foot flexor tendons.  Gait Nl. Neuro -  Nl w/o focal abnormalities.    Assessment & Plan:   1. Pain in joint, ankle and foot, right - ? Tendonitis  - predniSONE (DELTASONE) 10 MG tablet; 1 tab 3 x day for 2 days, then 1 tab 2 x day for 2 days, then 1 tab 1 x day for 3 days  Dispense: 13 tablet; Refill: 1  2. Essential hypertension Refill - atenolol (TENORMIN) 50 MG tablet; Take 50 mg by mouth daily. Take 1/2 to 1 tablet daily.

## 2018-07-15 ENCOUNTER — Encounter: Payer: Self-pay | Admitting: Internal Medicine

## 2018-09-09 ENCOUNTER — Ambulatory Visit: Payer: Self-pay | Admitting: Physician Assistant

## 2018-09-09 ENCOUNTER — Ambulatory Visit: Payer: Self-pay | Admitting: Internal Medicine

## 2018-09-14 DIAGNOSIS — N184 Chronic kidney disease, stage 4 (severe): Secondary | ICD-10-CM

## 2018-09-14 DIAGNOSIS — I2510 Atherosclerotic heart disease of native coronary artery without angina pectoris: Secondary | ICD-10-CM | POA: Insufficient documentation

## 2018-09-14 NOTE — Progress Notes (Signed)
MEDICARE ANNUAL WELLNESS VISIT AND FOLLOW UP  Assessment:   Essential hypertension -recommended atenolol in PM  - WILL STOP ACE DUE TO KIDNEY FUNCTIONS AND HISTORY OF ELEVATED POTASSIUM, GIVEN NORVASC WITH PARAMETERS FOR BP -DASH diet -cont exercise as tolerated - TSH   Hyperlipidemia -cont diet and exercise -given age is not a candidate for statins - Lipid panel  Vitamin D deficiency -cont Vit D supplement  Medication management - CBC with Differential/Platelet - BASIC METABOLIC PANEL WITH GFR - Hepatic function panel   Primary osteoarthritis of both hips -cont movement and exercise as tolerated   Medicare annual wellness visit, subsequent -due next year.  Seniles purpura Discussed process, protect skin, sunscreen  Type 2 diabetes mellitus with stage 4 chronic kidney disease, without long-term current use of insulin (HCC) -     Lipid panel -     Hemoglobin A1c -cont diet and exercise -UTD with eye exams - Hemoglobin A1c   CKD stage 4 due to type 2 diabetes mellitus (HCC) -     COMPLETE METABOLIC PANEL WITH GFR - - WILL STOP ACE DUE TO KIDNEY FUNCTIONS AND HISTORY OF ELEVATED POTASSIUM, GIVEN NORVASC WITH PARAMETERS FOR BP  ASHD (arteriosclerotic heart disease) Control blood pressure, cholesterol, glucose, increase exercise.  No chest pain  Flu vaccine need -     Flu vaccine HIGH DOSE PF (Fluzone High dose)     Over 30 minutes of exam, counseling, chart review, and critical decision making was performed Future Appointments  Date Time Provider [place]  12/19/2018 10:45 AM Vicie Mutters, PA-C [facility] None  03/14/2019  9:00 AM Unk Pinto, MD [facility] None     Plan:   During the course of the visit the patient was educated and counseled about appropriate screening and preventive services including:    Pneumococcal vaccine   Influenza vaccine  Td vaccine  Prevnar 13  Screening electrocardiogram  Screening mammography  Bone  densitometry screening  Colorectal cancer screening  Diabetes screening  Glaucoma screening  Nutrition counseling   Advanced directives: given info/requested copies  Subjective:   Jeanette Espinoza is a 84 y.o. female who presents for Medicare Annual Wellness Visit and 3 month follow up on hypertension, prediabetes, hyperlipidemia, vitamin D def.   She is at [employer], and moving from an outside garden home to inside the building and a one bedroom. She is still driving but did not drive here due to [place], only drives very close to home, she states she can not take her plants on the patio so she is changing.   Her blood pressure has been controlled at home, she is on enalapril in the AM and atenolol in the PM, today their BP is BP: (!) 142/64 She does not workout. She denies chest pain, shortness of breath, dizziness.   She is not on cholesterol medication. Her cholesterol is not at goal. The cholesterol last visit was:  She is off chol due to age and myalgias.  Lab Results  Component Value Date   CHOL 308 (H) 11/04/2017   HDL 60 11/04/2017   LDLCALC 209 (H) 11/04/2017   TRIG 201 (H) 11/04/2017   CHOLHDL 5.1 (H) 11/04/2017   She does have diabetes, she has had an A1C as high as 7.1 in the past but she is controlling it with diet and exercise.  She notes that she is asymptomatic. Lab Results  Component Value Date   HGBA1C 6.0 (H) 06/09/2018   She has CKD stage 4, she was  taken off her enalapril due to elevated potassium however she has been back on it since her last visit and did not understand directions, Last GFR Lab Results  Component Value Date   GFRNONAA 24 (L) 06/14/2018   Patient is on Vitamin D supplement. Lab Results  Component Value Date   VD25OH 67 02/11/2018     BMI is Body mass index is 25.67 kg/m., she is working on diet and exercise. Wt Readings from Last 3 Encounters:  09/16/18 122 lb 12.8 oz (55.7 kg)  07/14/18 122 lb (55.3 kg)  06/09/18 122 lb  (55.3 kg)    Medication Review Current Outpatient Medications on File Prior to Visit  Medication Sig Dispense Refill  . atenolol (TENORMIN) 50 MG tablet Take 50 mg by mouth daily. Take 1/2 to 1 tablet daily.    . Cholecalciferol (VITAMIN D) 2000 UNITS CAPS Take 6,000 Units by mouth daily.    . Magnesium 250 MG TABS Take by mouth 2 (two) times daily.     No current facility-administered medications on file prior to visit.     Current Problems (verified) Patient Active Problem List   Diagnosis Date Noted  . Senile purpura ([place]) 09/16/2018  . ASHD (arteriosclerotic heart disease) 09/14/2018  . CKD (chronic kidney disease) stage 4, GFR 15-29 ml/min (HCC) 09/14/2018  . CKD stage 4 due to type 2 diabetes mellitus ([place]) 11/03/2017  . HTN (hypertension) 01/02/2016  . Medication management 02/06/2014  . 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 Hyperlipidemia   . Vitamin D deficiency   . OA (osteoarthritis) of hip 12/02/2012    Screening Tests Immunization History  Administered Date(s) Administered  . Influenza Split 10/11/2013  . Influenza, High Dose Seasonal PF 09/18/2014, 09/20/2015, 08/04/2017, 09/16/2018  . Influenza, Seasonal, Injecte, Preservative Fre 10/15/2016  . Influenza-Unspecified 10/10/2012  . Pneumococcal Conjugate-13 12/19/2014  . Pneumococcal Polysaccharide-23 10/11/2013  . Tdap 09/21/2012    Preventative care: Last colonoscopy: 2008 will not get another Last mammogram: 2017  Prior vaccinations: TD or Tdap: 2013  Influenza: 2019 Pneumococcal: 2014 Prevnar13: 2016 Shingles/Zostavax: Declined  Names of Other Physician/Practitioners you currently use: 1. [facility]- here for primary care 2. Dr. Sabra Heck, eye doctor, last visit Nov 2019- will request 3. Dr. Lavone Neri , dentist, last visit Oct 2017 Patient Care Team: Unk Pinto, MD as PCP - General (Internal Medicine) Gaynelle Arabian, MD as Consulting Physician  (Orthopedic Surgery) Harrie Foreman, [place] as Referring Physician (Optometry) Teena Irani, MD (Inactive) as Consulting Physician (Gastroenterology) Suella Broad, MD as Consulting Physician (Physical Medicine and Rehabilitation) Almyra Deforest, [place] as Consulting Physician (Cardiology)  Allergies No Known Allergies  SURGICAL HISTORY She  has a past surgical history that includes lexiscan myoview (11/11); surgery for DDD; cataract surgery; abnormal EKG; Back surgery; Total hip arthroplasty (12/02/2012); Eye surgery (Bilateral); Spine surgery; Abdominal hysterectomy; Joint replacement; and colonoscopsy (2008). FAMILY HISTORY Her family history includes Diabetes in her unknown relative; Heart disease in her mother. SOCIAL HISTORY She  reports that she quit smoking about 47 years ago. She has never used smokeless tobacco. She reports that she drinks alcohol. She reports that she does not use drugs.   MEDICARE WELLNESS OBJECTIVES: Physical activity: Current Exercise Habits: The patient does not participate in regular exercise at present(some walking when the weather is good), Exercise limited by: orthopedic condition(s) Cardiac risk factors: Cardiac Risk Factors include: advanced age (>86men, >46 women);dyslipidemia;hypertension Depression/mood screen:   Depression screen [facility] 2/9 09/16/2018  Decreased Interest 0  Down, Depressed, Hopeless 0  PHQ - 2 Score 0    ADLs:  In your present state of health, do you have any difficulty performing the following activities: 09/16/2018 07/15/2018  Hearing? Y N  Vision? N N  Difficulty concentrating or making decisions? N N  Comment occ trouble with name finding  -  Walking or climbing stairs? Y N  Comment walks with a walker -  Dressing or bathing? N N  Doing errands, shopping? N N  Comment she will still go grocery shopping -  Some recent data might be hidden     Cognitive Testing  Alert? Yes  Normal Appearance?Yes  Oriented to person? Yes  Place? Yes    Time? Yes  Recall of three objects?  Yes  Can perform simple calculations? Yes  Displays appropriate judgment?Yes  Can read the correct time from a watch face?Yes  EOL planning: Does Patient Have a Medical Advance Directive?: Yes Type of Advance Directive: Healthcare Power of Attorney, Living will Does patient want to make changes to medical advance directive?: No - Patient declined   Objective:   Today's Vitals   09/16/18 0907  BP: (!) 142/64  Pulse: 76  Resp: 16  Temp: 97.7 F (36.5 C)  Weight: 122 lb 12.8 oz (55.7 kg)  Height: 4\' 10"  (1.473 m)   Body mass index is 25.67 kg/m.  General appearance: alert, no distress, WD/WN,  female HEENT: normocephalic, sclerae anicteric, TMs pearly, nares patent, no discharge or erythema, pharynx normal Oral cavity: MMM, no lesions Neck: supple, no lymphadenopathy, no thyromegaly, no masses Heart: RRR, normal S1, S2, no murmurs Lungs: CTA bilaterally, no wheezes, rhonchi, or rales Abdomen: +bs, soft, non tender, non distended, no masses, no hepatomegaly, no splenomegaly Musculoskeletal: nontender, no swelling, no obvious deformity Extremities: no edema, no cyanosis, no clubbing Pulses: 2+ symmetric, upper and lower extremities, normal cap refill Neurological: alert, oriented x 3, CN2-12 intact, strength normal upper extremities and lower extremities, sensation normal throughout, DTRs 2+ throughout, no cerebellar signs, gait normal with walker Psychiatric: normal affect, behavior normal, pleasant  Breast: defer Gyn: defer Rectal: defer   Medicare Attestation I have personally reviewed: The patient's medical and social history Their use of alcohol, tobacco or illicit drugs Their current medications and supplements The patient's functional ability including ADLs,fall risks, home safety risks, cognitive, and hearing and visual impairment Diet and physical activities Evidence for depression or mood disorders  The patient's weight,  height, BMI, and visual acuity have been recorded in the chart.  I have made referrals, counseling, and provided education to the patient based on review of the above and I have provided the patient with a written personalized care plan for preventive services.     Vicie Mutters, PA-C   09/16/2018

## 2018-09-16 ENCOUNTER — Ambulatory Visit: Payer: Medicare Other | Admitting: Physician Assistant

## 2018-09-16 VITALS — BP 142/64 | HR 76 | Temp 97.7°F | Resp 16 | Ht <= 58 in | Wt 122.8 lb

## 2018-09-16 DIAGNOSIS — R6889 Other general symptoms and signs: Secondary | ICD-10-CM

## 2018-09-16 DIAGNOSIS — Z23 Encounter for immunization: Secondary | ICD-10-CM

## 2018-09-16 DIAGNOSIS — N184 Chronic kidney disease, stage 4 (severe): Secondary | ICD-10-CM

## 2018-09-16 DIAGNOSIS — D692 Other nonthrombocytopenic purpura: Secondary | ICD-10-CM

## 2018-09-16 DIAGNOSIS — E559 Vitamin D deficiency, unspecified: Secondary | ICD-10-CM | POA: Diagnosis not present

## 2018-09-16 DIAGNOSIS — I2510 Atherosclerotic heart disease of native coronary artery without angina pectoris: Secondary | ICD-10-CM

## 2018-09-16 DIAGNOSIS — E782 Mixed hyperlipidemia: Secondary | ICD-10-CM

## 2018-09-16 DIAGNOSIS — M160 Bilateral primary osteoarthritis of hip: Secondary | ICD-10-CM

## 2018-09-16 DIAGNOSIS — I10 Essential (primary) hypertension: Secondary | ICD-10-CM

## 2018-09-16 DIAGNOSIS — Z79899 Other long term (current) drug therapy: Secondary | ICD-10-CM

## 2018-09-16 DIAGNOSIS — E1122 Type 2 diabetes mellitus with diabetic chronic kidney disease: Secondary | ICD-10-CM

## 2018-09-16 DIAGNOSIS — Z0001 Encounter for general adult medical examination with abnormal findings: Secondary | ICD-10-CM | POA: Diagnosis not present

## 2018-09-16 DIAGNOSIS — Z0000 Encounter for general adult medical examination without abnormal findings: Secondary | ICD-10-CM

## 2018-09-16 MED ORDER — AMLODIPINE BESYLATE 5 MG PO TABS: 5.0000 mg | ORAL_TABLET | Freq: Every day | ORAL | 1 refills | Status: DC

## 2018-09-16 MED ORDER — TRIAMCINOLONE ACETONIDE 0.1 % EX OINT: 1.0000 "application " | TOPICAL_OINTMENT | Freq: Two times a day (BID) | CUTANEOUS | 1 refills | Status: DC

## 2018-09-16 NOTE — Patient Instructions (Addendum)
Continue the atenolol Stop the enalapril due to your kidney function and start the amlodipine 5 mg in its place but take at night as well.   Keep an eye on your blood pressure If the 5 mg is too much for your blood pressure, it is below 120/60 or you are dizzy, drink lots of water and cut the norvasc in half.   HYPERTENSION INFORMATION  Monitor your blood pressure at home, please keep a record and bring that in with you to your next office visit.   Go to the ER if any CP, SOB, nausea, dizziness, severe HA, changes vision/speech  Your most recent BP: BP: (!) 142/64   DASH Eating Plan DASH stands for "Dietary Approaches to Stop Hypertension." The DASH eating plan is a healthy eating plan that has been shown to reduce high blood pressure (hypertension). Additional health benefits may include reducing the risk of type 2 diabetes mellitus, heart disease, and stroke. The DASH eating plan may also help with weight loss. WHAT DO I NEED TO KNOW ABOUT THE DASH EATING PLAN? For the DASH eating plan, you will follow these general guidelines:  Choose foods with a percent daily value for sodium of less than 5% (as listed on the food label).  Use salt-free seasonings or herbs instead of table salt or sea salt.  Check with your health care provider or pharmacist before using salt substitutes.  Eat lower-sodium products, often labeled as "lower sodium" or "no salt added."  Eat fresh foods.  Eat more vegetables, fruits, and low-fat dairy products.  Choose whole grains. Look for the word "whole" as the first word in the ingredient list.  Choose fish and skinless chicken or [place] more often than red meat. Limit fish, poultry, and meat to 6 oz (170 g) each day.  Limit sweets, desserts, sugars, and sugary drinks.  Choose heart-healthy fats.  Limit cheese to 1 oz (28 g) per day.  Eat more home-cooked food and less restaurant, buffet, and fast food.  Limit fried foods.  Cook foods using methods  other than frying.  Limit canned vegetables. If you do use them, rinse them well to decrease the sodium.  When eating at a restaurant, ask that your food be prepared with less salt, or no salt if possible. WHAT FOODS CAN I EAT? Seek help from a dietitian for individual calorie needs. Grains Whole grain or whole wheat bread. Brown rice. Whole grain or whole wheat pasta. Quinoa, bulgur, and whole grain cereals. Low-sodium cereals. Corn or whole wheat flour tortillas. Whole grain cornbread. Whole grain crackers. Low-sodium crackers. Vegetables Fresh or frozen vegetables (raw, steamed, roasted, or grilled). Low-sodium or reduced-sodium tomato and vegetable juices. Low-sodium or reduced-sodium tomato sauce and paste. Low-sodium or reduced-sodium canned vegetables.  Fruits All fresh, canned (in natural juice), or frozen fruits. Meat and Other Protein Products Ground beef (85% or leaner), grass-fed beef, or beef trimmed of fat. Skinless chicken or [place]. Ground chicken or [place]. Pork trimmed of fat. All fish and seafood. Eggs. Dried beans, peas, or lentils. Unsalted nuts and seeds. Unsalted canned beans. Dairy Low-fat dairy products, such as skim or 1% milk, 2% or reduced-fat cheeses, low-fat ricotta or cottage cheese, or plain low-fat yogurt. Low-sodium or reduced-sodium cheeses. Fats and Oils Tub margarines without trans fats. Light or reduced-fat mayonnaise and salad dressings (reduced sodium). Avocado. Safflower, olive, or canola oils. Natural peanut or almond butter. Other Unsalted popcorn and pretzels. The items listed above may not be a complete list of  recommended foods or beverages. Contact your dietitian for more options. WHAT FOODS ARE NOT RECOMMENDED? Grains White bread. White pasta. White rice. Refined cornbread. Bagels and croissants. Crackers that contain trans fat. Vegetables Creamed or fried vegetables. Vegetables in a cheese sauce. Regular canned vegetables. Regular canned  tomato sauce and paste. Regular tomato and vegetable juices. Fruits Dried fruits. Canned fruit in light or heavy syrup. Fruit juice. Meat and Other Protein Products Fatty cuts of meat. Ribs, chicken wings, bacon, sausage, bologna, salami, chitterlings, fatback, hot dogs, bratwurst, and packaged luncheon meats. Salted nuts and seeds. Canned beans with salt. Dairy Whole or 2% milk, cream, half-and-half, and cream cheese. Whole-fat or sweetened yogurt. Full-fat cheeses or blue cheese. Nondairy creamers and whipped toppings. Processed cheese, cheese spreads, or cheese curds. Condiments Onion and garlic salt, seasoned salt, table salt, and sea salt. Canned and packaged gravies. Worcestershire sauce. Tartar sauce. Barbecue sauce. Teriyaki sauce. Soy sauce, including reduced sodium. Steak sauce. Fish sauce. Oyster sauce. Cocktail sauce. Horseradish. Ketchup and mustard. Meat flavorings and tenderizers. Bouillon cubes. Hot sauce. Tabasco sauce. Marinades. Taco seasonings. Relishes. Fats and Oils Butter, stick margarine, lard, shortening, ghee, and bacon fat. Coconut, palm kernel, or palm oils. Regular salad dressings. Other Pickles and olives. Salted popcorn and pretzels. The items listed above may not be a complete list of foods and beverages to avoid. Contact your dietitian for more information. WHERE CAN I FIND MORE INFORMATION? National Heart, Lung, and Blood Institute: travelstabloid.com Document Released: 10/15/2011 Document Revised: 03/12/2014 Document Reviewed: 08/30/2013 [facility] Patient Information 2015 [place], [place]. This information is not intended to replace advice given to you by your health care provider. Make sure you discuss any questions you have with your health care provider.     Chronic Kidney Disease, Adult Chronic kidney disease (CKD) occurs when the kidneys become damaged slowly over a long period of time. The kidneys are a pair of organs that  do many important jobs in the body, including:  Removing waste and extra fluid from the blood to make urine.  Making hormones that maintain the amount of fluid in tissues and blood vessels.  Maintaining the right amount of fluids and chemicals in the body.  A small amount of kidney damage may not cause problems, but a large amount of damage may make it hard or impossible for the kidneys to work the way they should. If steps are not taken to slow down kidney damage or to stop it from getting worse, the kidneys may stop working permanently (end-stage renal disease or ESRD). Most of the time, CKD does not go away, but it can often be controlled. People who have CKD are usually able to live normal lives. What are the causes? The most common causes of this condition are diabetes and high blood pressure (hypertension). Other causes include:  Heart and blood vessel (cardiovascular) disease.  Kidney diseases, such as: ? Glomerulonephritis. ? Interstitial nephritis. ? Polycystic kidney disease. ? Renal vascular disease.  Diseases that affect the immune system.  Genetic diseases.  Medicines that damage the kidneys, such as anti-inflammatory medicines.  Being around or being in contact with poisonous (toxic) substances.  A kidney or urinary infection that occurs again and again (recurs).  Vasculitis. This is swelling or inflammation of the blood vessels.  A problem with urine flow that may be caused by: ? Cancer. ? Having kidney stones more than one time. ? An enlarged prostate, in males.  What increases the risk? You are more likely to develop  this condition if you:  Are older than age 27.  Are female.  Are African-American, Hispanic, Asian, [place], or American [place].  Are a current or former smoker.  Are obese.  Have a family history of kidney disease or failure.  Often take medicines that are damaging to the kidneys.  What are the signs or symptoms? Symptoms  of this condition include:  Swelling (edema) of the face, legs, ankles, or feet.  Tiredness (lethargy) and having less energy.  Nausea or vomiting.  Confusion or trouble concentrating.  Problems with urination, such as: ? Painful or burning feeling during urination. ? Decreased urine production. ? Frequent urination, especially at night. ? Bloody urine.  Muscle twitches and cramps, especially in the legs.  Shortness of breath.  Weakness.  Loss of appetite.  Metallic taste in the mouth.  Trouble sleeping.  Dry, itchy skin.  A low blood count (anemia).  Pale lining of the eyelids and surface of the eye (conjunctiva).  Symptoms develop slowly and may not be obvious until the kidney damage becomes severe. It is possible to have kidney disease for years without having any symptoms. How is this diagnosed? This condition may be diagnosed based on:  Blood tests.  Urine tests.  Imaging tests, such as an ultrasound or CT scan.  A test in which a sample of tissue is removed from the kidneys to be examined under a microscope (kidney biopsy).  These test results will help your health care provider determine how serious the CKD is. How is this treated? There is no cure for most cases of this condition, but treatment usually relieves symptoms and prevents or slows the progression of the disease. Treatment may include:  Making diet changes, which may require you to avoid alcohol, salty foods (sodium), and foods that are high in potassium, calcium, and protein.  Medicines: ? To lower blood pressure. ? To control blood glucose. ? To relieve anemia. ? To relieve swelling. ? To protect your bones. ? To improve the balance of electrolytes in your blood.  Removing toxic waste from the body through types of dialysis, if the kidneys can no longer do their job (kidney failure).  Managing any other conditions that are causing your CKD or making it worse.  Follow these  instructions at home: Medicines  Take over-the-counter and prescription medicines only as told by your health care provider. The dose of some medicines that you take may need to be adjusted.  Do not take any new medicines unless approved by your health care provider. Many medicines can worsen your kidney damage.  Do not take any vitamin and mineral supplements unless approved by your health care provider. Many nutritional supplements can worsen your kidney damage. General instructions  Follow your prescribed diet as told by your health care provider.  Do not use any products that contain nicotine or tobacco, such as cigarettes and e-cigarettes. If you need help quitting, ask your health care provider.  Monitor and track your blood pressure at home. Report changes in your blood pressure as told by your health care provider.  If you are being treated for diabetes, monitor and track your blood sugar (blood glucose) levels as told by your health care provider.  Maintain a healthy weight. If you need help with this, ask your health care provider.  Start or continue an exercise plan. Exercise at least 30 minutes a day, 5 days a week.  Keep your immunizations up to date as told by your health care  provider.  Keep all follow-up visits as told by your health care provider. This is important. Where to find more information:  American Association of Kidney Patients: BombTimer.gl  National Kidney Foundation: www.kidney.[place]: https://mathis.com/  [facility]: www.lifeoptions.org and www.kidneyschool.org Contact a health care provider if:  Your symptoms get worse.  You develop new symptoms. Get help right away if:  You develop symptoms of ESRD, which include: ? Headaches. ? Numbness in the hands or feet. ? Easy bruising. ? Frequent hiccups. ? Chest pain. ? Shortness of breath. ? Lack of menstruation, in women.  You have a fever.  You have  decreased urine production.  You have pain or bleeding when you urinate. Summary  Chronic kidney disease (CKD) occurs when the kidneys become damaged slowly over a long period of time.  The most common causes of this condition are diabetes and high blood pressure (hypertension).  There is no cure for most cases of this condition, but treatment usually relieves symptoms and prevents or slows the progression of the disease. Treatment may include a combination of medicines and lifestyle changes. This information is not intended to replace advice given to you by your health care provider. Make sure you discuss any questions you have with your health care provider. Document Released: 08/04/2008 Document Revised: 12/03/2016 Document Reviewed: 12/03/2016 Elsevier Interactive Patient Education  [employer].

## 2018-09-17 LAB — COMPLETE METABOLIC PANEL WITH GFR
AG Ratio: 1.7 (calc) (ref 1.0–2.5)
ALT: 10 U/L (ref 6–29)
AST: 17 U/L (ref 10–35)
Albumin: 4.4 g/dL (ref 3.6–5.1)
Alkaline phosphatase (APISO): 58 U/L (ref 33–130)
BUN/Creatinine Ratio: 18 (calc) (ref 6–22)
BUN: 34 mg/dL — ABNORMAL HIGH (ref 7–25)
CO2: 24 mmol/L (ref 20–32)
CREATININE: 1.85 mg/dL — AB (ref 0.60–0.88)
Calcium: 9.7 mg/dL (ref 8.6–10.4)
Chloride: 100 mmol/L (ref 98–110)
GFR, Est African American: 27 mL/min/{1.73_m2} — ABNORMAL LOW (ref >=60)
GFR, Est Non African American: 23 mL/min/{1.73_m2} — ABNORMAL LOW (ref >=60)
GLOBULIN: 2.6 g/dL (ref 1.9–3.7)
Glucose, Bld: 134 mg/dL — ABNORMAL HIGH (ref 65–99)
Potassium: 4.6 mmol/L (ref 3.5–5.3)
SODIUM: 134 mmol/L — AB (ref 135–146)
Total Bilirubin: 0.4 mg/dL (ref 0.2–1.2)
Total Protein: 7 g/dL (ref 6.1–8.1)

## 2018-09-17 LAB — LIPID PANEL
CHOLESTEROL: 250 mg/dL — AB (ref <200)
HDL: 60 mg/dL (ref >50)
LDL CHOLESTEROL (CALC): 152 mg/dL — AB
Non-HDL Cholesterol (Calc): 190 mg/dL (calc) — ABNORMAL HIGH (ref <130)
Total CHOL/HDL Ratio: 4.2 (calc) (ref <5.0)
Triglycerides: 212 mg/dL — ABNORMAL HIGH (ref <150)

## 2018-09-17 LAB — CBC WITH DIFFERENTIAL/PLATELET
BASOS ABS: 40 {cells}/uL (ref 0–200)
Basophils Relative: 0.6 %
EOS PCT: 1.1 %
Eosinophils Absolute: 73 cells/uL (ref 15–500)
HEMATOCRIT: 33.5 % — AB (ref 35.0–45.0)
Hemoglobin: 11.2 g/dL — ABNORMAL LOW (ref 11.7–15.5)
Lymphs Abs: 2290 cells/uL (ref 850–3900)
MCH: 31 pg (ref 27.0–33.0)
MCHC: 33.4 g/dL (ref 32.0–36.0)
MCV: 92.8 fL (ref 80.0–100.0)
MPV: 11.6 fL (ref 7.5–12.5)
Monocytes Relative: 9.3 %
NEUTROS PCT: 54.3 %
Neutro Abs: 3584 cells/uL (ref 1500–7800)
PLATELETS: 206 10*3/uL (ref 140–400)
RBC: 3.61 10*6/uL — ABNORMAL LOW (ref 3.80–5.10)
RDW: 12.6 % (ref 11.0–15.0)
TOTAL LYMPHOCYTE: 34.7 %
WBC mixed population: 614 cells/uL (ref 200–950)
WBC: 6.6 10*3/uL (ref 3.8–10.8)

## 2018-09-17 LAB — MAGNESIUM: Magnesium: 2.4 mg/dL (ref 1.5–2.5)

## 2018-09-17 LAB — HEMOGLOBIN A1C
EAG (MMOL/L): 7.3 (calc)
HEMOGLOBIN A1C: 6.2 %{Hb} — AB (ref <5.7)
MEAN PLASMA GLUCOSE: 131 (calc)

## 2018-09-17 LAB — TSH: TSH: 1.79 m[IU]/L (ref 0.40–4.50)

## 2018-12-16 NOTE — Progress Notes (Signed)
MEDICARE ANNUAL WELLNESS VISIT AND FOLLOW UP  Assessment:   Essential hypertension -recommended atenolol in PM  - CONTROLLED OFF ACE, DOING WELL ON NORVASC -DASH diet -cont exercise as tolerated - TSH   Hyperlipidemia -cont diet and exercise -given age is not a candidate for statins - Lipid panel  Vitamin D deficiency -cont Vit D supplement  Medication management - CBC with Differential/Platelet - BASIC METABOLIC PANEL WITH GFR - Hepatic function panel   Primary osteoarthritis of both hips -cont movement and exercise as tolerated   Medicare annual wellness visit, subsequent -due next year.  Seniles purpura Discussed process, protect skin, sunscreen  Type 2 diabetes mellitus with stage 4 chronic kidney disease, without long-term current use of insulin (HCC) -     Lipid panel -     Hemoglobin A1c -cont diet and exercise -UTD with eye exams - Hemoglobin A1c   CKD stage 4 due to type 2 diabetes mellitus (HCC) -     COMPLETE METABOLIC PANEL WITH GFR - - OFF ACE DUE TO KIDNEY FUNCTION  ASHD (arteriosclerotic heart disease) Control blood pressure, cholesterol, glucose, increase exercise.  No chest pain     Over 30 minutes of exam, counseling, chart review, and critical decision making was performed Future Appointments  Date Time Provider [place]  04/07/2019 11:00 AM Unk Pinto, MD [facility] None     Plan:   During the course of the visit the patient was educated and counseled about appropriate screening and preventive services including:    Pneumococcal vaccine   Influenza vaccine  Td vaccine  Prevnar 13  Screening electrocardiogram  Screening mammography  Bone densitometry screening  Colorectal cancer screening  Diabetes screening  Glaucoma screening  Nutrition counseling   Advanced directives: given info/requested copies  Subjective:   Jeanette Espinoza is a 85 y.o. female who presents for Medicare Annual Wellness  Visit and 3 month follow up on hypertension, prediabetes, hyperlipidemia, vitamin D def.   She is at [facility], and moved from an outside garden home to inside the building and a one bedroom. [facility] brought her today, she still drives but only to a grocery store very close to [facility]. She has been very fatigued with the move. She is hard of hearing, no hearing aids.   Her blood pressure has been controlled at home, she is off ACE due to kidney function and on norvasc now and atenolol in the PM, today their BP is BP: 138/76 She does not workout. She denies chest pain, shortness of breath, dizziness.   She is not on cholesterol medication. Her cholesterol is not at goal. The cholesterol last visit was:  She is off chol due to age and myalgias.  Lab Results  Component Value Date   CHOL 250 (H) 09/16/2018   HDL 60 09/16/2018   LDLCALC 152 (H) 09/16/2018   TRIG 212 (H) 09/16/2018   CHOLHDL 4.2 09/16/2018   She does have diabetes, she has had an A1C as high as 7.1 in the past but she is controlling it with diet and exercise.  She notes that she is asymptomatic. Lab Results  Component Value Date   HGBA1C 6.2 (H) 09/16/2018   She has CKD stage 4, she was taken off her enalapril due to elevated potassium, she is on norvasc now Last GFR Lab Results  Component Value Date   GFRNONAA 23 (L) 09/16/2018   Patient is on Vitamin D supplement. Lab Results  Component Value Date   VD25OH 67 02/11/2018  BMI is Body mass index is 25.25 kg/m., she is working on diet and exercise. Wt Readings from Last 3 Encounters:  12/19/18 120 lb 12.8 oz (54.8 kg)  09/16/18 122 lb 12.8 oz (55.7 kg)  07/14/18 122 lb (55.3 kg)    Medication Review Current Outpatient Medications on File Prior to Visit  Medication Sig Dispense Refill  . amLODipine (NORVASC) 5 MG tablet Take 1 tablet (5 mg total) by mouth daily. 90 tablet 1  . Cholecalciferol (VITAMIN D) 2000 UNITS CAPS Take 6,000 Units by mouth  daily.    . Magnesium 250 MG TABS Take by mouth 2 (two) times daily.    Marland Kitchen triamcinolone ointment (KENALOG) 0.1 % Apply 1 application topically 2 (two) times daily. 80 g 1   No current facility-administered medications on file prior to visit.     Current Problems (verified) Patient Active Problem List   Diagnosis Date Noted  . Senile purpura ([place]) 09/16/2018  . ASHD (arteriosclerotic heart disease) 09/14/2018  . CKD (chronic kidney disease) stage 4, GFR 15-29 ml/min (HCC) 09/14/2018  . CKD stage 4 due to type 2 diabetes mellitus ([place]) 11/03/2017  . HTN (hypertension) 01/02/2016  . Medication management 02/06/2014  . 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 Hyperlipidemia   . Vitamin D deficiency   . OA (osteoarthritis) of hip 12/02/2012    Screening Tests Immunization History  Administered Date(s) Administered  . Influenza Split 10/11/2013  . Influenza, High Dose Seasonal PF 09/18/2014, 09/20/2015, 08/04/2017, 09/16/2018  . Influenza, Seasonal, Injecte, Preservative Fre 10/15/2016  . Influenza-Unspecified 10/10/2012  . Pneumococcal Conjugate-13 12/19/2014  . Pneumococcal Polysaccharide-23 10/11/2013  . Tdap 09/21/2012    Preventative care: Last colonoscopy: 2008 will not get another Last mammogram: 2017- will not get another  Prior vaccinations: TD or Tdap: 2013  Influenza: 2019 Pneumococcal: 2014 Prevnar13: 2016 Shingles/Zostavax: Declined  Names of Other Physician/Practitioners you currently use: 1. [facility]- here for primary care 2. Dr. Sabra Heck, eye doctor, last visit Nov 2019- will request 3. Dr. Lavone Neri , dentist, last visit Oct 2019 Patient Care Team: Unk Pinto, MD as PCP - General (Internal Medicine) Gaynelle Arabian, MD as Consulting Physician (Orthopedic Surgery) Harrie Foreman, [place] as Referring Physician (Optometry) Teena Irani, MD (Inactive) as Consulting Physician (Gastroenterology) Suella Broad, MD as  Consulting Physician (Physical Medicine and Rehabilitation) Almyra Deforest, [place] as Consulting Physician (Cardiology)  Allergies No Known Allergies  SURGICAL HISTORY She  has a past surgical history that includes lexiscan myoview (11/11); surgery for DDD; cataract surgery; abnormal EKG; Back surgery; Total hip arthroplasty (12/02/2012); Eye surgery (Bilateral); Spine surgery; Abdominal hysterectomy; Joint replacement; and colonoscopsy (2008). FAMILY HISTORY Her family history includes Diabetes in her unknown relative; Heart disease in her mother. SOCIAL HISTORY She  reports that she quit smoking about 48 years ago. She has never used smokeless tobacco. She reports current alcohol use. She reports that she does not use drugs.   MEDICARE WELLNESS OBJECTIVES: Physical activity: Current Exercise Habits: The patient does not participate in regular exercise at present Cardiac risk factors: Cardiac Risk Factors include: advanced age (>62men, >8 women);dyslipidemia;hypertension Depression/mood screen:   Depression screen [facility] 2/9 12/19/2018  Decreased Interest 0  Down, Depressed, Hopeless 0  PHQ - 2 Score 0    ADLs:  In your present state of health, do you have any difficulty performing the following activities: 12/19/2018 09/16/2018  Hearing? Tempie Donning  Vision? N N  Difficulty concentrating or making decisions? N N  Comment -  occ trouble with name finding   Walking or climbing stairs? Y Y  Comment - walks with a walker  Dressing or bathing? N N  Doing errands, shopping? N N  Comment - she will still go grocery shopping  Some recent data might be hidden     Cognitive Testing  Alert? Yes  Normal Appearance?Yes  Oriented to person? Yes  Place? Yes   Time? Yes  Recall of three objects?  Yes  Can perform simple calculations? Yes  Displays appropriate judgment?Yes  Can read the correct time from a watch face?Yes  EOL planning: Does Patient Have a Medical Advance Directive?: Yes Type of Advance  Directive: Healthcare Power of Attorney, Living will Does patient want to make changes to medical advance directive?: No - Patient declined   Objective:   Today's Vitals   12/19/18 1036  BP: 138/76  Pulse: 67  Resp: 14  Temp: 97.9 F (36.6 C)  SpO2: 99%  Weight: 120 lb 12.8 oz (54.8 kg)  Height: 4\' 10"  (1.473 m)   Body mass index is 25.25 kg/m.  General appearance: alert, no distress, WD/WN,  female HEENT: normocephalic, sclerae anicteric, TMs pearly, nares patent, no discharge or erythema, pharynx normal Oral cavity: MMM, no lesions Neck: supple, no lymphadenopathy, no thyromegaly, no masses Heart: RRR, normal S1, S2, no murmurs Lungs: CTA bilaterally, no wheezes, rhonchi, or rales Abdomen: +bs, soft, non tender, non distended, no masses, no hepatomegaly, no splenomegaly Musculoskeletal: nontender, no swelling, no obvious deformity Extremities: no edema, no cyanosis, no clubbing Pulses: 2+ symmetric, upper and lower extremities, normal cap refill Neurological: alert, oriented x 3, CN2-12 intact, strength normal upper extremities and lower extremities, sensation normal throughout, DTRs 2+ throughout, no cerebellar signs, gait normal with walker Psychiatric: normal affect, behavior normal, pleasant  Breast: defer Gyn: defer Rectal: defer   Medicare Attestation I have personally reviewed: The patient's medical and social history Their use of alcohol, tobacco or illicit drugs Their current medications and supplements The patient's functional ability including ADLs,fall risks, home safety risks, cognitive, and hearing and visual impairment Diet and physical activities Evidence for depression or mood disorders  The patient's weight, height, BMI, and visual acuity have been recorded in the chart.  I have made referrals, counseling, and provided education to the patient based on review of the above and I have provided the patient with a written personalized care plan for  preventive services.     Vicie Mutters, PA-C   12/19/2018

## 2018-12-19 ENCOUNTER — Encounter: Payer: Self-pay | Admitting: Physician Assistant

## 2018-12-19 ENCOUNTER — Ambulatory Visit: Payer: Medicare Other | Admitting: Physician Assistant

## 2018-12-19 VITALS — BP 138/76 | HR 67 | Temp 97.9°F | Resp 14 | Ht <= 58 in | Wt 120.8 lb

## 2018-12-19 DIAGNOSIS — Z79899 Other long term (current) drug therapy: Secondary | ICD-10-CM

## 2018-12-19 DIAGNOSIS — E559 Vitamin D deficiency, unspecified: Secondary | ICD-10-CM

## 2018-12-19 DIAGNOSIS — R6889 Other general symptoms and signs: Secondary | ICD-10-CM

## 2018-12-19 DIAGNOSIS — I2510 Atherosclerotic heart disease of native coronary artery without angina pectoris: Secondary | ICD-10-CM | POA: Diagnosis not present

## 2018-12-19 DIAGNOSIS — I10 Essential (primary) hypertension: Secondary | ICD-10-CM

## 2018-12-19 DIAGNOSIS — N184 Chronic kidney disease, stage 4 (severe): Secondary | ICD-10-CM

## 2018-12-19 DIAGNOSIS — E782 Mixed hyperlipidemia: Secondary | ICD-10-CM

## 2018-12-19 DIAGNOSIS — Z0001 Encounter for general adult medical examination with abnormal findings: Secondary | ICD-10-CM

## 2018-12-19 DIAGNOSIS — E1122 Type 2 diabetes mellitus with diabetic chronic kidney disease: Secondary | ICD-10-CM

## 2018-12-19 DIAGNOSIS — M160 Bilateral primary osteoarthritis of hip: Secondary | ICD-10-CM

## 2018-12-19 DIAGNOSIS — Z0000 Encounter for general adult medical examination without abnormal findings: Secondary | ICD-10-CM

## 2018-12-19 DIAGNOSIS — D692 Other nonthrombocytopenic purpura: Secondary | ICD-10-CM

## 2018-12-19 MED ORDER — ATENOLOL 50 MG PO TABS: ORAL_TABLET | ORAL | 3 refills | Status: DC

## 2018-12-19 NOTE — Patient Instructions (Signed)
  Jeanette Espinoza , Thank you for taking time to come for your Medicare Wellness Visit. I appreciate your ongoing commitment to your health goals. Please review the following plan we discussed and let me know if I can assist you in the future.    This is a list of the screening recommended for you and due dates:  Health Maintenance  Topic Date Due  . Urine Protein Check  01/21/2018  . Eye exam for diabetics  09/17/2018  . DEXA scan (bone density measurement)  05/10/2019*  . Complete foot exam   02/11/2019  . Hemoglobin A1C  03/17/2019  . Tetanus Vaccine  09/21/2022  . Flu Shot  Completed  . Pneumonia vaccines  Completed  *Topic was postponed. The date shown is not the original due date.    If you have been in the ER, hospital, or long term care facility, we want to see you within one week of discharge so please CONTACT OUR OFFICE at [phone].  We will try to contact you if we know about your admission/visit but we would love for you to contact us first.  We know your history and want to make sure all of your questions are answered and needs are taken care of after admission.   We also want our patients to know that we do NOT approve of [facility] soliciting our patients to do home visits and we do NOT approve of [facility] screenings. Please contact us before doing either of these "services".

## 2018-12-20 LAB — COMPLETE METABOLIC PANEL WITH GFR
AG Ratio: 1.5 (calc) (ref 1.0–2.5)
ALT: 9 U/L (ref 6–29)
AST: 14 U/L (ref 10–35)
Albumin: 4.5 g/dL (ref 3.6–5.1)
Alkaline phosphatase (APISO): 65 U/L (ref 37–153)
BUN/Creatinine Ratio: 18 (calc) (ref 6–22)
BUN: 29 mg/dL — ABNORMAL HIGH (ref 7–25)
CALCIUM: 9.9 mg/dL (ref 8.6–10.4)
CO2: 23 mmol/L (ref 20–32)
CREATININE: 1.61 mg/dL — AB (ref 0.60–0.88)
Chloride: 103 mmol/L (ref 98–110)
GFR, EST NON AFRICAN AMERICAN: 28 mL/min/{1.73_m2} — AB (ref >=60)
GFR, Est African American: 32 mL/min/{1.73_m2} — ABNORMAL LOW (ref >=60)
Globulin: 3 g/dL (calc) (ref 1.9–3.7)
Glucose, Bld: 112 mg/dL — ABNORMAL HIGH (ref 65–99)
Potassium: 4.4 mmol/L (ref 3.5–5.3)
Sodium: 136 mmol/L (ref 135–146)
Total Bilirubin: 0.4 mg/dL (ref 0.2–1.2)
Total Protein: 7.5 g/dL (ref 6.1–8.1)

## 2018-12-20 LAB — CBC WITH DIFFERENTIAL/PLATELET
Absolute Monocytes: 554 cells/uL (ref 200–950)
BASOS ABS: 28 {cells}/uL (ref 0–200)
Basophils Relative: 0.4 %
EOS PCT: 1 %
Eosinophils Absolute: 71 cells/uL (ref 15–500)
HEMATOCRIT: 35.8 % (ref 35.0–45.0)
Hemoglobin: 12 g/dL (ref 11.7–15.5)
LYMPHS ABS: 2954 {cells}/uL (ref 850–3900)
MCH: 30.7 pg (ref 27.0–33.0)
MCHC: 33.5 g/dL (ref 32.0–36.0)
MCV: 91.6 fL (ref 80.0–100.0)
MONOS PCT: 7.8 %
MPV: 11.7 fL (ref 7.5–12.5)
NEUTROS PCT: 49.2 %
Neutro Abs: 3493 cells/uL (ref 1500–7800)
Platelets: 201 10*3/uL (ref 140–400)
RBC: 3.91 10*6/uL (ref 3.80–5.10)
RDW: 12.5 % (ref 11.0–15.0)
TOTAL LYMPHOCYTE: 41.6 %
WBC: 7.1 10*3/uL (ref 3.8–10.8)

## 2018-12-20 LAB — LIPID PANEL
CHOL/HDL RATIO: 5.8 (calc) — AB (ref <5.0)
Cholesterol: 321 mg/dL — ABNORMAL HIGH (ref <200)
HDL: 55 mg/dL (ref >=50)
LDL Cholesterol (Calc): 225 mg/dL (calc) — ABNORMAL HIGH
NON-HDL CHOLESTEROL (CALC): 266 mg/dL — AB (ref <130)
Triglycerides: 223 mg/dL — ABNORMAL HIGH (ref <150)

## 2018-12-20 LAB — MAGNESIUM: MAGNESIUM: 2.3 mg/dL (ref 1.5–2.5)

## 2018-12-20 LAB — HEMOGLOBIN A1C
Hgb A1c MFr Bld: 6.5 % of total Hgb — ABNORMAL HIGH (ref <5.7)
Mean Plasma Glucose: 140 (calc)
eAG (mmol/L): 7.7 (calc)

## 2018-12-26 ENCOUNTER — Other Ambulatory Visit: Payer: Self-pay

## 2018-12-26 DIAGNOSIS — I10 Essential (primary) hypertension: Secondary | ICD-10-CM

## 2018-12-26 MED ORDER — ATENOLOL 50 MG PO TABS: ORAL_TABLET | ORAL | 3 refills | Status: DC

## 2018-12-26 MED ORDER — TRIAMCINOLONE ACETONIDE 0.1 % EX OINT: 1.0000 "application " | TOPICAL_OINTMENT | Freq: Two times a day (BID) | CUTANEOUS | 1 refills | Status: DC

## 2019-03-14 ENCOUNTER — Encounter: Payer: Self-pay | Admitting: Internal Medicine

## 2019-03-28 ENCOUNTER — Other Ambulatory Visit: Payer: Self-pay

## 2019-03-28 MED ORDER — AMLODIPINE BESYLATE 5 MG PO TABS: 5.0000 mg | ORAL_TABLET | Freq: Every day | ORAL | 1 refills | Status: DC

## 2019-03-28 NOTE — Telephone Encounter (Signed)
Med refill request

## 2019-04-07 ENCOUNTER — Encounter: Payer: Self-pay | Admitting: Internal Medicine

## 2019-04-12 ENCOUNTER — Ambulatory Visit: Payer: Medicare Other | Admitting: Internal Medicine

## 2019-04-12 ENCOUNTER — Encounter: Payer: Self-pay | Admitting: Internal Medicine

## 2019-04-12 ENCOUNTER — Other Ambulatory Visit: Payer: Self-pay

## 2019-04-12 VITALS — BP 138/76 | HR 76 | Temp 97.3°F | Resp 16 | Ht <= 58 in | Wt 124.0 lb

## 2019-04-12 DIAGNOSIS — E0822 Diabetes mellitus due to underlying condition with diabetic chronic kidney disease: Secondary | ICD-10-CM

## 2019-04-12 DIAGNOSIS — Z136 Encounter for screening for cardiovascular disorders: Secondary | ICD-10-CM | POA: Diagnosis not present

## 2019-04-12 DIAGNOSIS — E559 Vitamin D deficiency, unspecified: Secondary | ICD-10-CM

## 2019-04-12 DIAGNOSIS — I10 Essential (primary) hypertension: Secondary | ICD-10-CM | POA: Diagnosis not present

## 2019-04-12 DIAGNOSIS — I2510 Atherosclerotic heart disease of native coronary artery without angina pectoris: Secondary | ICD-10-CM | POA: Diagnosis not present

## 2019-04-12 DIAGNOSIS — Z79899 Other long term (current) drug therapy: Secondary | ICD-10-CM

## 2019-04-12 DIAGNOSIS — Z87891 Personal history of nicotine dependence: Secondary | ICD-10-CM

## 2019-04-12 DIAGNOSIS — Z1211 Encounter for screening for malignant neoplasm of colon: Secondary | ICD-10-CM

## 2019-04-12 DIAGNOSIS — Z0001 Encounter for general adult medical examination with abnormal findings: Secondary | ICD-10-CM

## 2019-04-12 DIAGNOSIS — E1122 Type 2 diabetes mellitus with diabetic chronic kidney disease: Secondary | ICD-10-CM

## 2019-04-12 DIAGNOSIS — D692 Other nonthrombocytopenic purpura: Secondary | ICD-10-CM

## 2019-04-12 DIAGNOSIS — Z0000 Encounter for general adult medical examination without abnormal findings: Secondary | ICD-10-CM | POA: Diagnosis not present

## 2019-04-12 DIAGNOSIS — N184 Chronic kidney disease, stage 4 (severe): Secondary | ICD-10-CM

## 2019-04-12 DIAGNOSIS — E782 Mixed hyperlipidemia: Secondary | ICD-10-CM

## 2019-04-12 DIAGNOSIS — Z8249 Family history of ischemic heart disease and other diseases of the circulatory system: Secondary | ICD-10-CM

## 2019-04-12 NOTE — Progress Notes (Signed)
[facility] Unk Pinto, M.D.     Uvaldo Bristle. Silverio Lay, P.A.-C Liane Comber, DNP _______________________________________________ [facility] [address] [place] [address] Telephone [phone] Telefax [phone] Annual Screening/Preventative Visit & Comprehensive Evaluation &  Examination     This very nice 85 y.o. WWF presents for a Screening /Preventative Visit & comprehensive evaluation and management of multiple medical co-morbidities.  Patient has been followed for HTN, HLD, T2_DM w/CKD4 and Vitamin D Deficiency.      She alleges her HTN predates circa 2007. Patient's BP has been controlled at home and patient denies any cardiac symptoms as chest pain, palpitations, shortness of breath, dizziness or ankle swelling. Today's BP is at goal- 138/76      Patient's hyperlipidemia is not controlled with diet and not aggressively treated due to age Lab Results  Component Value Date   CHOL 321 (H) 12/19/2018   HDL 55 12/19/2018   LDLCALC 225 (H) 12/19/2018   TRIG 223 (H) 12/19/2018   CHOLHDL 5.8 (H) 12/19/2018      Patient has hx/o T2_NIDDM (A1c 6.5% / 2016)  W/CKD (GFR 28) and patient is attempting mgmt by diet.  She denies reactive hypoglycemic symptoms, visual blurring, diabetic polys or paresthesias. Last A1c was not at goal: Lab Results  Component Value Date   HGBA1C 6.5 (H) 12/19/2018      Finally, patient has history of Vitamin D Deficiency and last Vitamin D was at goal; Lab Results  Component Value Date   VD25OH 67 02/11/2018   Current Outpatient Medications on File Prior to Visit  Medication Sig  . amLODipine (NORVASC) 5 MG tablet Take 1 tablet (5 mg total) by mouth daily.  Marland Kitchen atenolol (TENORMIN) 50 MG tablet Take 1/2 tablet daily.  . Cholecalciferol (VITAMIN D) 2000 UNITS CAPS Take 6,000 Units by mouth daily.  . Magnesium 250 MG TABS Take by mouth 2 (two) times daily.  Marland Kitchen triamcinolone  ointment (KENALOG) 0.1 % Apply 1 application topically 2 (two) times daily.   No current facility-administered medications on file prior to visit.    No Known Allergies Past Medical History:  Diagnosis Date  . Arthritis   . HLD (hyperlipidemia)   . HTN (hypertension)    echo (11/11) with EF 55-60%, mild LV hypertrophy, PA systolic pressure 34 mmHg  . Low back pain   . Pre-diabetes    diet controlled  . Unspecified vitamin D deficiency    Health Maintenance  Topic Date Due  . URINE MICROALBUMIN  01/21/2018  . OPHTHALMOLOGY EXAM  09/17/2018  . FOOT EXAM  02/11/2019  . DEXA SCAN  05/10/2019 (Originally 06/03/1992)  . INFLUENZA VACCINE  06/10/2019  . HEMOGLOBIN A1C  06/19/2019  . TETANUS/TDAP  09/21/2022  . PNA vac Low Risk Adult  Completed   Immunization History  Administered Date(s) Administered  . Influenza Split 10/11/2013  . Influenza, High Dose Seasonal PF 09/18/2014, 09/20/2015, 08/04/2017, 09/16/2018  . Influenza, Seasonal, Injecte, Preservative Fre 10/15/2016  . Influenza-Unspecified 10/10/2012  . Pneumococcal Conjugate-13 12/19/2014  . Pneumococcal Polysaccharide-23 10/11/2013  . Tdap 09/21/2012   Last Colon - 2008 - Dr Amedeo Plenty - aged out  Last MGM - 01/21/2016  Past Surgical History:  Procedure Laterality Date  . ABDOMINAL HYSTERECTOMY     partial  . abnormal EKG    . BACK SURGERY    . cataract surgery    . colonoscopsy  2008   Neg  Dr Amedeo Plenty  . EYE SURGERY Bilateral  cataract  . JOINT REPLACEMENT     left hip replacement , right knee replacement   . lexiscan myoview  11/11   EF 81%, normal perfusion images suggesting no ischemia or infarction  . SPINE SURGERY     lumbar  . surgery for DDD    . TOTAL HIP ARTHROPLASTY  12/02/2012   Procedure: TOTAL HIP ARTHROPLASTY;  Surgeon: Gearlean Alf, MD;  Location: [facility];  Service: Orthopedics;  Laterality: Right;   Family History  Problem Relation Age of Onset  . Heart disease Mother   . Diabetes Unknown         DM - sibiling   . Coronary artery disease Neg Hx        no premature CAd    Social History   Tobacco Use  . Smoking status: Former Smoker    Last attempt to quit: 11/09/1970    Years since quitting: 48.4  . Smokeless tobacco: Never Used  Substance Use Topics  . Alcohol use: Yes    Comment: occasional   . Drug use: No    ROS Constitutional: Denies fever, chills, weight loss/gain, headaches, insomnia,  night sweats, and change in appetite. Does c/o fatigue. Eyes: Denies redness, blurred vision, diplopia, discharge, itchy, watery eyes.  ENT: Denies discharge, congestion, post nasal drip, epistaxis, sore throat, earache, hearing loss, dental pain, Tinnitus, Vertigo, Sinus pain, snoring.  Cardio: Denies chest pain, palpitations, irregular heartbeat, syncope, dyspnea, diaphoresis, orthopnea, PND, claudication, edema Respiratory: denies cough, dyspnea, DOE, pleurisy, hoarseness, laryngitis, wheezing.  Gastrointestinal: Denies dysphagia, heartburn, reflux, water brash, pain, cramps, nausea, vomiting, bloating, diarrhea, constipation, hematemesis, melena, hematochezia, jaundice, hemorrhoids Genitourinary: Denies dysuria, frequency, urgency, nocturia, hesitancy, discharge, hematuria, flank pain Breast: Breast lumps, nipple discharge, bleeding.  Musculoskeletal: Denies arthralgia, myalgia, stiffness, Jt. Swelling, pain, limp, and strain/sprain. Denies falls. Skin: Denies puritis, rash, hives, warts, acne, eczema, changing in skin lesion Neuro: No weakness, tremor, incoordination, spasms, paresthesia, pain Psychiatric: Denies confusion, memory loss, sensory loss. Denies Depression. Endocrine: Denies change in weight, skin, hair change, nocturia, and paresthesia, diabetic polys, visual blurring, hyper / hypo glycemic episodes.  Heme/Lymph: No excessive bleeding, bruising, enlarged lymph nodes.  Physical Exam  BP 138/76   Pulse 76   Temp (!) 97.3 F (36.3 C)   Resp 16   Ht 4\' 10"  (1.473  m)   Wt 124 lb (56.2 kg)   BMI 25.92 kg/m   General Appearance: Well nourished, well groomed and in no apparent distress.  Eyes: PERRLA, EOMs, conjunctiva no swelling or erythema, normal fundi and vessels. Sinuses: No frontal/maxillary tenderness ENT/Mouth: EACs patent / TMs  nl. Nares clear without erythema, swelling, mucoid exudates. Oral hygiene is good. No erythema, swelling, or exudate. Tongue normal, non-obstructing. Tonsils not swollen or erythematous. Hearing normal.  Neck: Supple, thyroid not palpable. No bruits, nodes or JVD. Respiratory: Respiratory effort normal.  BS equal and clear bilateral without rales, rhonci, wheezing or stridor. Cardio: Heart sounds are normal with regular rate and rhythm and no murmurs, rubs or gallops. Peripheral pulses are normal and equal bilaterally without edema. No aortic or femoral bruits. Chest: symmetric with normal excursions and percussion. Breasts: Symmetric, without lumps, nipple discharge, retractions, or fibrocystic changes.  Abdomen: Flat, soft with bowel sounds active. Nontender, no guarding, rebound, hernias, masses, or organomegaly.  Lymphatics: Non tender without lymphadenopathy.  Musculoskeletal: Full ROM all peripheral extremities, joint stability, 5/5 strength, and normal gait. Skin: Warm and dry without rashes, lesions, cyanosis, clubbing or  ecchymosis.  Neuro:  Cranial nerves intact, reflexes equal bilaterally. Normal muscle tone, no cerebellar symptoms. Sensation intact to touch, vibratory and Monofilament to the toes bilaterally. Pysch: Alert and oriented X 3, normal affect, Insight and Judgment appropriate.   Assessment and Plan  1. Annual Preventative Screening Examination  2. Essential hypertension  - EKG 12-Lead - Urinalysis, Routine w reflex microscopic - Microalbumin / creatinine urine ratio - CBC with Differential/Platelet - COMPLETE METABOLIC PANEL WITH GFR - Magnesium - TSH  3. Hyperlipidemia, mixed  -  Lipid Profile Deferred due to non treatment - EKG 12-Lead - TSH  4. Type 2 diabetes mellitus with stage 4 chronic kidney disease without long-term current use of insulin (HCC)  - EKG 12-Lead - Urinalysis, Routine w reflex microscopic - Microalbumin / creatinine urine ratio - Hemoglobin A1c - Insulin, random  5. Vitamin D deficiency  - VITAMIN D 25 Hydroxyl  6. ASHD (arteriosclerotic heart disease)  - EKG 12-Lead  7. CKD stage 4 due to type 2 diabetes mellitus (HCC)  - Urinalysis, Routine w reflex microscopic - Microalbumin / creatinine urine ratio - COMPLETE METABOLIC PANEL WITH GFR  8. Senile purpura (HCC)  - CBC with Differential/Platelet  9. Screening for colorectal cancer  - POC Hemoccult Bld/Stl   10. Screening for ischemic heart disease  - EKG 12-Lead  11. FHx: heart disease  - EKG 12-Lead  12. Former smoker  - EKG 12-Lead - CBC with Differential/Platelet  13. Medication management  - Urinalysis, Routine w reflex microscopic - Microalbumin / creatinine urine ratio - CBC with Differential/Platelet - COMPLETE METABOLIC PANEL WITH GFR - Magnesium - TSH - Hemoglobin A1c - Insulin, random - VITAMIN D 25 Hydroxyl         Patient was counseled in prudent diet to achieve/maintain BMI less than 25 for weight control, BP monitoring, regular exercise and medications. Discussed med's effects and SE's. Screening labs and tests as requested with regular follow-up as recommended. Over 40 minutes of exam, counseling, chart review and high complex critical decision making was performed.

## 2019-04-12 NOTE — Patient Instructions (Signed)
- Vit D  And Vit C 1,000 mg  are recommended to help protect  against the Covid_19 and other Corona viruses.   - Also it's recommended to take Zinc 50 mg to help  protect against the Covid_19  And best place to get  is also on [employer] and don't pay more than 6-8 cents /pill !   ================================ Coronavirus (COVID-19) Are you at risk?  Are you at risk for the Coronavirus (COVID-19)?  To be considered HIGH RISK for Coronavirus (COVID-19), you have to meet the following criteria:  . Traveled to [place], [place], [place], [place] or [place]; or in the [place] to [place], [place], [place]  . or [place]; and have fever, cough, and shortness of breath within the last 2 weeks of travel OR . Been in close contact with a person diagnosed with COVID-19 within the last 2 weeks and have  . fever, cough,and shortness of breath .  . IF YOU DO NOT MEET THESE CRITERIA, YOU ARE CONSIDERED LOW RISK FOR COVID-19.  What to do if you are HIGH RISK for COVID-19?  Marland Kitchen If you are having a medical emergency, call 911. . Seek medical care right away. Before you go to a doctor's office, urgent care or emergency department, .  call ahead and tell them about your recent travel, contact with someone diagnosed with COVID-19  .  and your symptoms.  . You should receive instructions from your physician's office regarding next steps of care.  . When you arrive at healthcare provider, tell the healthcare staff immediately you have returned from  . visiting [place], [place], [place], [place] or [place]; or traveled in the [place] to [place], [place],  . [place] or [place] in the last two weeks or you have been in close contact with a person diagnosed with  . COVID-19 in the last 2 weeks.   . Tell the health care staff about your symptoms: fever, cough and shortness of breath. . After you have been seen by a medical provider, you will be either: o Tested for (COVID-19) and  discharged home on quarantine except to seek medical care if  o symptoms worsen, and asked to  - Stay home and avoid contact with others until you get your results (4-5 days)  - Avoid travel on public transportation if possible (such as bus, train, or airplane) or o Sent to the Emergency Department by EMS for evaluation, COVID-19 testing  and  o possible admission depending on your condition and test results.  What to do if you are LOW RISK for COVID-19?  Reduce your risk of any infection by using the same precautions used for avoiding the common cold or flu:  Marland Kitchen Wash your hands often with soap and warm water for at least 20 seconds.  If soap and water are not readily available,  . use an alcohol-based hand sanitizer with at least 60% alcohol.  . If coughing or sneezing, cover your mouth and nose by coughing or sneezing into the elbow areas of your shirt or coat, .  into a tissue or into your sleeve (not your hands). . Avoid shaking hands with others and consider head nods or verbal greetings only. . Avoid touching your eyes, nose, or mouth with unwashed hands.  . Avoid close contact with people who are sick. . Avoid places or events with large numbers of people in one location, like concerts or sporting events. . Carefully consider travel plans  you have or are making. . If you are planning any travel outside or inside the [place], visit the CDC's Travelers' Health webpage for the latest health notices. . If you have some symptoms but not all symptoms, continue to monitor at home and seek medical attention  . if your symptoms worsen. . If you are having a medical emergency, call 911.   . >>>>>>>>>>>>>>>>>>>>>>>>>>>>>>>>> . We Do NOT Approve of  [facility], [employer]  Our Patients  To Do Home Visits & We Do NOT Approve of [employer] > > > > > > > > > > > > > > > > > > > > > > > > > > > > > > > > > > > > > > >  Preventive Care for Adults  A healthy lifestyle  and preventive care can promote health and wellness. Preventive health guidelines for women include the following key practices.  A routine yearly physical is a good way to check with your health care provider about your health and preventive screening. It is a chance to share any concerns and updates on your health and to receive a thorough exam.  Visit your dentist for a routine exam and preventive care every 6 months. Brush your teeth twice a day and floss once a day. Good oral hygiene prevents tooth decay and gum disease.  The frequency of eye exams is based on your age, health, family medical history, use of contact lenses, and other factors. Follow your health care provider's recommendations for frequency of eye exams.  Eat a healthy diet. Foods like vegetables, fruits, whole grains, low-fat dairy products, and lean protein foods contain the nutrients you need without too many calories. Decrease your intake of foods high in solid fats, added sugars, and salt. Eat the right amount of calories for you. Get information about a proper diet from your health care provider, if necessary.  Regular physical exercise is one of the most important things you can do for your health. Most adults should get at least 150 minutes of moderate-intensity exercise (any activity that increases your heart rate and causes you to sweat) each week. In addition, most adults need muscle-strengthening exercises on 2 or more days a week.  Maintain a healthy weight. The body mass index (BMI) is a screening tool to identify possible weight problems. It provides an estimate of body fat based on height and weight. Your health care provider can find your BMI and can help you achieve or maintain a healthy weight. For adults 20 years and older:  A BMI below 18.5 is considered underweight.  A BMI of 18.5 to 24.9 is normal.  A BMI of 25 to 29.9 is considered overweight.  A BMI of 30 and above is considered obese.  Maintain  normal blood lipids and cholesterol levels by exercising and minimizing your intake of saturated fat. Eat a balanced diet with plenty of fruit and vegetables. If your lipid or cholesterol levels are high, you are over 50, or you are at high risk for heart disease, you may need your cholesterol levels checked more frequently. Ongoing high lipid and cholesterol levels should be treated with medicines if diet and exercise are not working.  If you smoke, find out from your health care provider how to quit. If you do not use tobacco, do not start.  Lung cancer screening is recommended for adults aged 11-80 years who are at high risk for developing lung cancer because of a history  of smoking. A yearly low-dose CT scan of the lungs is recommended for people who have at least a 30-pack-year history of smoking and are a current smoker or have quit within the past 15 years. A pack year of smoking is smoking an average of 1 pack of cigarettes a day for 1 year (for example: 1 pack a day for 30 years or 2 packs a day for 15 years). Yearly screening should continue until the smoker has stopped smoking for at least 15 years. Yearly screening should be stopped for people who develop a health problem that would prevent them from having lung cancer treatment.  Avoid use of street drugs. Do not share needles with anyone. Ask for help if you need support or instructions about stopping the use of drugs.  High blood pressure causes heart disease and increases the risk of stroke.  Ongoing high blood pressure should be treated with medicines if weight loss and exercise do not work.  If you are 98-51 years old, ask your health care provider if you should take aspirin to prevent strokes.  Diabetes screening involves taking a blood sample to check your fasting blood sugar level. This should be done once every 3 years, after age 69, if you are within normal weight and without risk factors for diabetes. Testing should be considered  at a younger age or be carried out more frequently if you are overweight and have at least 1 risk factor for diabetes.  Breast cancer screening is essential preventive care for women. You should practice "breast self-awareness." This means understanding the normal appearance and feel of your breasts and may include breast self-examination. Any changes detected, no matter how small, should be reported to a health care provider. Women in their 25s and 30s should have a clinical breast exam (CBE) by a health care provider as part of a regular health exam every 1 to 3 years. After age 54, women should have a CBE every year. Starting at age 63, women should consider having a mammogram (breast X-ray test) every year. Women who have a family history of breast cancer should talk to their health care provider about genetic screening. Women at a high risk of breast cancer should talk to their health care providers about having an MRI and a mammogram every year.  Breast cancer gene (BRCA)-related cancer risk assessment is recommended for women who have family members with BRCA-related cancers. BRCA-related cancers include breast, ovarian, tubal, and peritoneal cancers. Having family members with these cancers may be associated with an increased risk for harmful changes (mutations) in the breast cancer genes BRCA1 and BRCA2. Results of the assessment will determine the need for genetic counseling and BRCA1 and BRCA2 testing.  Routine pelvic exams to screen for cancer are no longer recommended for nonpregnant women who are considered low risk for cancer of the pelvic organs (ovaries, uterus, and vagina) and who do not have symptoms. Ask your health care provider if a screening pelvic exam is right for you.  If you have had past treatment for cervical cancer or a condition that could lead to cancer, you need Pap tests and screening for cancer for at least 20 years after your treatment. If Pap tests have been discontinued,  your risk factors (such as having a new sexual partner) need to be reassessed to determine if screening should be resumed. Some women have medical problems that increase the chance of getting cervical cancer. In these cases, your health care provider may recommend more  frequent screening and Pap tests.    Colorectal cancer can be detected and often prevented. Most routine colorectal cancer screening begins at the age of 68 years and continues through age 33 years. However, your health care provider may recommend screening at an earlier age if you have risk factors for colon cancer. On a yearly basis, your health care provider may provide home test kits to check for hidden blood in the stool. Use of a small camera at the end of a tube, to directly examine the colon (sigmoidoscopy or colonoscopy), can detect the earliest forms of colorectal cancer. Talk to your health care provider about this at age 89, when routine screening begins.  Direct exam of the colon should be repeated every 5-10 years through age 29 years, unless early forms of pre-cancerous polyps or small growths are found.  Osteoporosis is a disease in which the bones lose minerals and strength with aging. This can result in serious bone fractures or breaks. The risk of osteoporosis can be identified using a bone density scan. Women ages 101 years and over and women at risk for fractures or osteoporosis should discuss screening with their health care providers. Ask your health care provider whether you should take a calcium supplement or vitamin D to reduce the rate of osteoporosis.  Menopause can be associated with physical symptoms and risks. Hormone replacement therapy is available to decrease symptoms and risks. You should talk to your health care provider about whether hormone replacement therapy is right for you.  Use sunscreen. Apply sunscreen liberally and repeatedly throughout the day. You should seek shade when your shadow is shorter  than you. Protect yourself by wearing long sleeves, pants, a wide-brimmed hat, and sunglasses year round, whenever you are outdoors.  Once a month, do a whole body skin exam, using a mirror to look at the skin on your back. Tell your health care provider of new moles, moles that have irregular borders, moles that are larger than a pencil eraser, or moles that have changed in shape or color.  Stay current with required vaccines (immunizations).  Influenza vaccine. All adults should be immunized every year.  Tetanus, diphtheria, and acellular pertussis (Td, Tdap) vaccine. Pregnant women should receive 1 dose of Tdap vaccine during each pregnancy. The dose should be obtained regardless of the length of time since the last dose. Immunization is preferred during the 27th-36th week of gestation. An adult who has not previously received Tdap or who does not know her vaccine status should receive 1 dose of Tdap. This initial dose should be followed by tetanus and diphtheria toxoids (Td) booster doses every 10 years. Adults with an unknown or incomplete history of completing a 3-dose immunization series with Td-containing vaccines should begin or complete a primary immunization series including a Tdap dose. Adults should receive a Td booster every 10 years.    Zoster vaccine. One dose is recommended for adults aged 58 years or older unless certain conditions are present.    Pneumococcal 13-valent conjugate (PCV13) vaccine. When indicated, a person who is uncertain of her immunization history and has no record of immunization should receive the PCV13 vaccine. An adult aged 65 years or older who has certain medical conditions and has not been previously immunized should receive 1 dose of PCV13 vaccine. This PCV13 should be followed with a dose of pneumococcal polysaccharide (PPSV23) vaccine. The PPSV23 vaccine dose should be obtained at least 1 or more year(s) after the dose of PCV13 vaccine. An adult  aged 32  years or older who has certain medical conditions and previously received 1 or more doses of PPSV23 vaccine should receive 1 dose of PCV13. The PCV13 vaccine dose should be obtained 1 or more years after the last PPSV23 vaccine dose.    Pneumococcal polysaccharide (PPSV23) vaccine. When PCV13 is also indicated, PCV13 should be obtained first. All adults aged 59 years and older should be immunized. An adult younger than age 78 years who has certain medical conditions should be immunized. Any person who resides in a nursing home or long-term care facility should be immunized. An adult smoker should be immunized. People with an immunocompromised condition and certain other conditions should receive both PCV13 and PPSV23 vaccines. People with human immunodeficiency virus (HIV) infection should be immunized as soon as possible after diagnosis. Immunization during chemotherapy or radiation therapy should be avoided. Routine use of PPSV23 vaccine is not recommended for American Indians, [place] Natives, or people younger than 65 years unless there are medical conditions that require PPSV23 vaccine. When indicated, people who have unknown immunization and have no record of immunization should receive PPSV23 vaccine. One-time revaccination 5 years after the first dose of PPSV23 is recommended for people aged 19-64 years who have chronic kidney failure, nephrotic syndrome, asplenia, or immunocompromised conditions. People who received 1-2 doses of PPSV23 before age 25 years should receive another dose of PPSV23 vaccine at age 87 years or later if at least 5 years have passed since the previous dose. Doses of PPSV23 are not needed for people immunized with PPSV23 at or after age 45 years.   Preventive Services / Frequency  Ages 68 years and over  Blood pressure check.  Lipid and cholesterol check.  Lung cancer screening. / Every year if you are aged 21-80 years and have a 30-pack-year history of smoking and  currently smoke or have quit within the past 15 years. Yearly screening is stopped once you have quit smoking for at least 15 years or develop a health problem that would prevent you from having lung cancer treatment.  Clinical breast exam.** / Every year after age 73 years.   BRCA-related cancer risk assessment.** / For women who have family members with a BRCA-related cancer (breast, ovarian, tubal, or peritoneal cancers).  Mammogram.** / Every year beginning at age 71 years and continuing for as long as you are in good health. Consult with your health care provider.  Pap test.** / Every 3 years starting at age 62 years through age 21 or 29 years with 3 consecutive normal Pap tests. Testing can be stopped between 65 and 70 years with 3 consecutive normal Pap tests and no abnormal Pap or HPV tests in the past 10 years.  Fecal occult blood test (FOBT) of stool. / Every year beginning at age 20 years and continuing until age 35 years. You may not need to do this test if you get a colonoscopy every 10 years.  Flexible sigmoidoscopy or colonoscopy.** / Every 5 years for a flexible sigmoidoscopy or every 10 years for a colonoscopy beginning at age 63 years and continuing until age 75 years.  Hepatitis C blood test.** / For all people born from [birth year] through 1963 and any individual with known risks for hepatitis C.  Osteoporosis screening.** / A one-time screening for women ages 59 years and over and women at risk for fractures or osteoporosis.  Skin self-exam. / Monthly.  Influenza vaccine. / Every year.  Tetanus, diphtheria, and acellular pertussis (Tdap/Td) vaccine.** /  1 dose of Td every 10 years.  Zoster vaccine.** / 1 dose for adults aged 60 years or older.  Pneumococcal 13-valent conjugate (PCV13) vaccine.** / Consult your health care provider.  Pneumococcal polysaccharide (PPSV23) vaccine.** / 1 dose for all adults aged 65 years and older. Screening for abdominal aortic aneurysm (AAA)   by ultrasound is recommended for people who have history of high blood pressure or who are current or former smokers. ++++++++++++++++++++ Recommend Adult Low Dose Aspirin or  coated  Aspirin 81 mg daily  To reduce risk of Colon Cancer 40 %,  Skin Cancer 26 % ,  Melanoma 46%  and  Pancreatic cancer 60% ++++++++++++++++++++ Vitamin D goal  is between 70-100.  Please make sure that you are taking your Vitamin D as directed.  It is very important as a natural anti-inflammatory  helping hair, skin, and nails, as well as reducing stroke and heart attack risk.  It helps your bones and helps with mood. It also decreases numerous cancer risks so please take it as directed.  Low Vit D is associated with a 200-300% higher risk for CANCER  and 200-300% higher risk for HEART   ATTACK  &  STROKE.   ...................................... It is also associated with higher death rate at younger ages,  autoimmune diseases like Rheumatoid arthritis, Lupus, Multiple Sclerosis.    Also many other serious conditions, like depression, Alzheimer's Dementia, infertility, muscle aches, fatigue, fibromyalgia - just to name a few. ++++++++++++++++++ Recommend the book "The END of DIETING" by Dr Joel Fuhrman  & the book "The END of DIABETES " by Dr Joel Fuhrman At [employer] - get book & Audio CD's    Being diabetic has a  300% increased risk for heart attack, stroke, cancer, and alzheimer- type vascular dementia. It is very important that you work harder with diet by avoiding all foods that are white. Avoid white rice (brown & wild rice is OK), white potatoes (sweetpotatoes in moderation is OK), White bread or wheat bread or anything made out of white flour like bagels, donuts, rolls, buns, biscuits, cakes, pastries, cookies, pizza crust, and pasta (made from white flour & egg whites) - vegetarian pasta or spinach or wheat pasta is OK. Multigrain breads like Arnold's or [employer], or multigrain sandwich thins  or flatbreads.  Diet, exercise and weight loss can reverse and cure diabetes in the early stages.  Diet, exercise and weight loss is very important in the control and prevention of complications of diabetes which affects every system in your body, ie. Brain - dementia/stroke, eyes - glaucoma/blindness, heart - heart attack/heart failure, kidneys - dialysis, stomach - gastric paralysis, intestines - malabsorption, nerves - severe painful neuritis, circulation - gangrene & loss of a leg(s), and finally cancer and Alzheimers.    I recommend avoid fried & greasy foods,  sweets/candy, white rice (brown or wild rice or Quinoa is OK), white potatoes (sweet potatoes are OK) - anything made from white flour - bagels, doughnuts, rolls, buns, biscuits,white and wheat breads, pizza crust and traditional pasta made of white flour & egg white(vegetarian pasta or spinach or wheat pasta is OK).  Multi-grain bread is OK - like multi-grain flat bread or sandwich thins. Avoid alcohol in excess. Exercise is also important.    Eat all the vegetables you want - avoid meat, especially red meat and dairy - especially cheese.  Cheese is the most concentrated form of trans-fats which is the worst thing to clog up our arteries. Veggie   cheese is OK which can be found in the fresh produce section at [employer] or [employer] or [employer]  +++++++++++++++++++ DASH Eating Plan  DASH stands for "Dietary Approaches to Stop Hypertension."   The DASH eating plan is a healthy eating plan that has been shown to reduce high blood pressure (hypertension). Additional health benefits may include reducing the risk of type 2 diabetes mellitus, heart disease, and stroke. The DASH eating plan may also help with weight loss. WHAT DO I NEED TO KNOW ABOUT THE DASH EATING PLAN? For the DASH eating plan, you will follow these general guidelines:  Choose foods with a percent daily value for sodium of less than 5% (as listed on the food  label).  Use salt-free seasonings or herbs instead of table salt or sea salt.  Check with your health care provider or pharmacist before using salt substitutes.  Eat lower-sodium products, often labeled as "lower sodium" or "no salt added."  Eat fresh foods.  Eat more vegetables, fruits, and low-fat dairy products.  Choose whole grains. Look for the word "whole" as the first word in the ingredient list.  Choose fish   Limit sweets, desserts, sugars, and sugary drinks.  Choose heart-healthy fats.  Eat veggie cheese   Eat more home-cooked food and less restaurant, buffet, and fast food.  Limit fried foods.  Cook foods using methods other than frying.  Limit canned vegetables. If you do use them, rinse them well to decrease the sodium.  When eating at a restaurant, ask that your food be prepared with less salt, or no salt if possible.                      WHAT FOODS CAN I EAT? Read Dr Fara Olden Fuhrman's books on The End of Dieting & The End of Diabetes  Grains Whole grain or whole wheat bread. Brown rice. Whole grain or whole wheat pasta. Quinoa, bulgur, and whole grain cereals. Low-sodium cereals. Corn or whole wheat flour tortillas. Whole grain cornbread. Whole grain crackers. Low-sodium crackers.  Vegetables Fresh or frozen vegetables (raw, steamed, roasted, or grilled). Low-sodium or reduced-sodium tomato and vegetable juices. Low-sodium or reduced-sodium tomato sauce and paste. Low-sodium or reduced-sodium canned vegetables.   Fruits All fresh, canned (in natural juice), or frozen fruits.  Protein Products  All fish and seafood.  Dried beans, peas, or lentils. Unsalted nuts and seeds. Unsalted canned beans.  Dairy Low-fat dairy products, such as skim or 1% milk, 2% or reduced-fat cheeses, low-fat ricotta or cottage cheese, or plain low-fat yogurt. Low-sodium or reduced-sodium cheeses.  Fats and Oils Tub margarines without trans fats. Light or reduced-fat mayonnaise  and salad dressings (reduced sodium). Avocado. Safflower, olive, or canola oils. Natural peanut or almond butter.  Other Unsalted popcorn and pretzels. The items listed above may not be a complete list of recommended foods or beverages. Contact your dietitian for more options.  +++++++++++++++  WHAT FOODS ARE NOT RECOMMENDED? Grains/ White flour or wheat flour White bread. White pasta. White rice. Refined cornbread. Bagels and croissants. Crackers that contain trans fat.  Vegetables  Creamed or fried vegetables. Vegetables in a . Regular canned vegetables. Regular canned tomato sauce and paste. Regular tomato and vegetable juices.  Fruits Dried fruits. Canned fruit in light or heavy syrup. Fruit juice.  Meat and Other Protein Products Meat in general - RED meat & White meat.  Fatty cuts of meat. Ribs, chicken wings, all processed meats as bacon, sausage, bologna, salami,  fatback, hot dogs, bratwurst and packaged luncheon meats.  Dairy Whole or 2% milk, cream, half-and-half, and cream cheese. Whole-fat or sweetened yogurt. Full-fat cheeses or blue cheese. Non-dairy creamers and whipped toppings. Processed cheese, cheese spreads, or cheese curds.  Condiments Onion and garlic salt, seasoned salt, table salt, and sea salt. Canned and packaged gravies. Worcestershire sauce. Tartar sauce. Barbecue sauce. Teriyaki sauce. Soy sauce, including reduced sodium. Steak sauce. Fish sauce. Oyster sauce. Cocktail sauce. Horseradish. Ketchup and mustard. Meat flavorings and tenderizers. Bouillon cubes. Hot sauce. Tabasco sauce. Marinades. Taco seasonings. Relishes.  Fats and Oils Butter, stick margarine, lard, shortening and bacon fat. Coconut, palm kernel, or palm oils. Regular salad dressings.  Pickles and olives. Salted popcorn and pretzels.  The items listed above may not be a complete list of foods and beverages to avoid.

## 2019-04-13 LAB — URINALYSIS, ROUTINE W REFLEX MICROSCOPIC
Bilirubin Urine: NEGATIVE
Glucose, UA: NEGATIVE
Hgb urine dipstick: NEGATIVE
Hyaline Cast: NONE SEEN /LPF
Ketones, ur: NEGATIVE
Nitrite: NEGATIVE
Specific Gravity, Urine: 1.01 (ref 1.001–1.03)
pH: 8 (ref 5.0–8.0)

## 2019-04-13 LAB — COMPLETE METABOLIC PANEL WITH GFR
AG Ratio: 1.6 (calc) (ref 1.0–2.5)
ALT: 11 U/L (ref 6–29)
AST: 15 U/L (ref 10–35)
Albumin: 4.6 g/dL (ref 3.6–5.1)
Alkaline phosphatase (APISO): 66 U/L (ref 37–153)
BUN/Creatinine Ratio: 19 (calc) (ref 6–22)
BUN: 34 mg/dL — ABNORMAL HIGH (ref 7–25)
CO2: 24 mmol/L (ref 20–32)
Calcium: 10 mg/dL (ref 8.6–10.4)
Chloride: 100 mmol/L (ref 98–110)
Creat: 1.75 mg/dL — ABNORMAL HIGH (ref 0.60–0.88)
GFR, Est African American: 29 mL/min/{1.73_m2} — ABNORMAL LOW (ref >=60)
GFR, Est Non African American: 25 mL/min/{1.73_m2} — ABNORMAL LOW (ref >=60)
Globulin: 2.9 g/dL (calc) (ref 1.9–3.7)
Glucose, Bld: 108 mg/dL — ABNORMAL HIGH (ref 65–99)
Potassium: 4.2 mmol/L (ref 3.5–5.3)
Sodium: 136 mmol/L (ref 135–146)
Total Bilirubin: 0.5 mg/dL (ref 0.2–1.2)
Total Protein: 7.5 g/dL (ref 6.1–8.1)

## 2019-04-13 LAB — CBC WITH DIFFERENTIAL/PLATELET
Absolute Monocytes: 641 cells/uL (ref 200–950)
Basophils Absolute: 45 cells/uL (ref 0–200)
Basophils Relative: 0.5 %
Eosinophils Absolute: 125 cells/uL (ref 15–500)
Eosinophils Relative: 1.4 %
HCT: 35.6 % (ref 35.0–45.0)
Hemoglobin: 12.2 g/dL (ref 11.7–15.5)
Lymphs Abs: 3168 cells/uL (ref 850–3900)
MCH: 31.2 pg (ref 27.0–33.0)
MCHC: 34.3 g/dL (ref 32.0–36.0)
MCV: 91 fL (ref 80.0–100.0)
MPV: 12.1 fL (ref 7.5–12.5)
Monocytes Relative: 7.2 %
Neutro Abs: 4922 cells/uL (ref 1500–7800)
Neutrophils Relative %: 55.3 %
Platelets: 180 10*3/uL (ref 140–400)
RBC: 3.91 10*6/uL (ref 3.80–5.10)
RDW: 12.3 % (ref 11.0–15.0)
Total Lymphocyte: 35.6 %
WBC: 8.9 10*3/uL (ref 3.8–10.8)

## 2019-04-13 LAB — MICROALBUMIN / CREATININE URINE RATIO
Creatinine, Urine: 34 mg/dL (ref 20–275)
Microalb Creat Ratio: 200 mcg/mg creat — ABNORMAL HIGH (ref <30)
Microalb, Ur: 6.8 mg/dL

## 2019-04-13 LAB — TSH: TSH: 2.57 mIU/L (ref 0.40–4.50)

## 2019-04-13 LAB — HEMOGLOBIN A1C
Hgb A1c MFr Bld: 6.2 % of total Hgb — ABNORMAL HIGH (ref <5.7)
Mean Plasma Glucose: 131 (calc)
eAG (mmol/L): 7.3 (calc)

## 2019-04-13 LAB — VITAMIN D 25 HYDROXY (VIT D DEFICIENCY, FRACTURES): Vit D, 25-Hydroxy: 66 ng/mL (ref 30–100)

## 2019-04-13 LAB — INSULIN, RANDOM: Insulin: 4.4 u[IU]/mL

## 2019-04-13 LAB — MAGNESIUM: Magnesium: 2.8 mg/dL — ABNORMAL HIGH (ref 1.5–2.5)

## 2019-06-09 ENCOUNTER — Ambulatory Visit: Payer: Medicare Other | Admitting: Podiatry

## 2019-06-09 ENCOUNTER — Encounter: Payer: Self-pay | Admitting: Podiatry

## 2019-06-09 ENCOUNTER — Other Ambulatory Visit: Payer: Self-pay

## 2019-06-09 DIAGNOSIS — M79674 Pain in right toe(s): Secondary | ICD-10-CM

## 2019-06-09 DIAGNOSIS — E0822 Diabetes mellitus due to underlying condition with diabetic chronic kidney disease: Secondary | ICD-10-CM

## 2019-06-09 DIAGNOSIS — N184 Chronic kidney disease, stage 4 (severe): Secondary | ICD-10-CM

## 2019-06-09 DIAGNOSIS — M79675 Pain in left toe(s): Secondary | ICD-10-CM | POA: Diagnosis not present

## 2019-06-09 DIAGNOSIS — B351 Tinea unguium: Secondary | ICD-10-CM

## 2019-06-09 NOTE — Progress Notes (Signed)
Complaint:  Visit Type: Patient presents  to my office for continued preventative foot care services. Complaint: Patient states" my nails have grown long and thick and become painful to walk and wear shoes".  She says the fourth toenail left foot and second toenail are painful.  She says she usually had her nails done at [facility] but due to the virus her nails are long and painful. Patient has been diagnosed with DM with no foot complications. The patient presents for preventative foot care services. No changes to ROS  Podiatric Exam: Vascular: dorsalis pedis and posterior tibial pulses are weakly  palpable/absent  bilateral. Capillary return is immediate. Cold feet.. Skin turgor WNL  Sensorium: Normal Semmes Weinstein monofilament test. Normal tactile sensation bilaterally. Nail Exam: Pt has thick disfigured discolored nails fourth left foot and second right foot. Ulcer Exam: There is no evidence of ulcer or pre-ulcerative changes or infection. Orthopedic Exam: Muscle tone and strength are WNL. No limitations in general ROM. No crepitus or effusions noted. Foot type and digits show no abnormalities. Bony prominences are unremarkable. Skin: No Porokeratosis. No infection or ulcers  Diagnosis:  Onychomycosis, , Pain in right toe, pain in left toes  Treatment & Plan Procedures and Treatment: Consent by patient was obtained for treatment procedures.   Debridement of mycotic and hypertrophic toenails, 1 through 5 bilateral and clearing of subungual debris. No ulceration, no infection noted.  Return Visit-Office Procedure: Patient instructed to return to the office for a follow up visit prn  for continued evaluation and treatment.    Gardiner Barefoot DPM

## 2019-07-10 NOTE — Progress Notes (Signed)
FOLLOW UP  Assessment and Plan:   Hypertension  Well controlled with current medications  Monitor blood pressure at home; patient to call if consistently greater than 130/80 Continue DASH diet.   Reminder to go to the ER if any CP, SOB, nausea, dizziness, severe HA, changes vision/speech, left arm numbness and tingling and jaw pain.  Cholesterol Not at goal; treated by lifestyle without medications secondary to age  Lifestyle reviewed at length Continue low cholesterol diet and exercise.  Defer lipid panel.   Diabetes with diabetic chronic kidney disease Currently with A1Cs in prediabetic range off of medications Continue diet and exercise.  Perform daily foot/skin check, notify office of any concerning changes.  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 Further disposition pending results of labs. Discussed med's effects and SE's.   Over 30 minutes of exam, counseling, chart review, and critical decision making was performed.   Future Appointments  Date Time Provider [place]  10/16/2019  9:30 AM Unk Pinto, MD [facility] None  01/25/2020  9:00 AM Vicie Mutters, PA-C [facility] None  05/10/2020 10:00 AM Unk Pinto, MD [facility] None    ----------------------------------------------------------------------------------------------------------------------  HPI 85 y.o.  Caucasian female who lives in assisted living community presents for 3 month follow up on hypertension, cholesterol, T2 diabetes, stable 4th stage CKD and vitamin D deficiency.   BMI is Body mass index is 26.33 kg/m., she has been working on diet, was walking until it got hot, hasn't been feeling up to doing much exercise with current weather, is trying to limit her starches.  Wt Readings from Last 3 Encounters:  07/13/19 126 lb (57.2 kg)  04/12/19 124 lb (56.2 kg)  12/19/18 120 lb 12.8 oz (54.8 kg)   Her blood pressure has been controlled at home, today their BP is BP: 140/78  She does not workout - she is limited by ability to ambulate - she reports she does make an effort to walk daily with her walker.  She denies chest pain, shortness of breath, dizziness.   She is not on cholesterol medication secondary to age and denies myalgias. Her cholesterol is not at goal. The cholesterol last visit was:   Lab Results  Component Value Date   CHOL 321 (H) 12/19/2018   HDL 55 12/19/2018   LDLCALC 225 (H) 12/19/2018   TRIG 223 (H) 12/19/2018   CHOLHDL 5.8 (H) 12/19/2018    She has been working on diet and exercise for diet controlled T2 diabetes (A1C 6.6% in 2016), and denies nausea, polydipsia, polyuria, visual disturbances, vomiting and weight loss. She has recently demonstrated improved A1Cs in prediabetic range. Last A1C in the office was:  Lab Results  Component Value Date   HGBA1C 6.2 (H) 04/12/2019   Patient is on Vitamin D supplement and was at goal at last visit:   Lab Results  Component Value Date   VD25OH 66 04/12/2019  She has stable stage 4 CKD secondary to diabetes:  Lab Results  Component Value Date   GFRNONAA 25 (L) 04/12/2019   GFRNONAA 28 (L) 12/19/2018   GFRNONAA 23 (L) 09/16/2018    Current Medications:  Current Outpatient Medications on File Prior to Visit  Medication Sig  . amLODipine (NORVASC) 5 MG tablet   . atenolol (TENORMIN) 50 MG tablet Take 1/2 tablet  daily.  . Cholecalciferol (VITAMIN D) 2000 UNITS CAPS Take 6,000 Units by mouth daily.  . Magnesium 250 MG TABS Take by mouth 2 (two) times daily.  Marland Kitchen triamcinolone ointment (KENALOG) 0.1 % Apply 1 application topically 2 (two) times daily.   No current facility-administered medications on file prior to visit.      Allergies: No Known Allergies   Medical History:  Past Medical History:  Diagnosis Date  . Arthritis   . HLD (hyperlipidemia)   . HTN (hypertension)    echo (11/11) with EF 55-60%, mild LV hypertrophy, PA systolic pressure 34 mmHg  . Low back pain   . Pre-diabetes    diet controlled  . Unspecified vitamin D deficiency    Family history- Reviewed and unchanged Social history- Reviewed and unchanged   Review of Systems:  Review of Systems  Constitutional: Negative for malaise/fatigue and weight loss.  HENT: Negative for congestion, hearing loss, sore throat and tinnitus.   Eyes: Negative for blurred vision and double vision.  Respiratory: Negative for cough, sputum production, shortness of breath and wheezing.   Cardiovascular: Negative for chest pain, palpitations, orthopnea, claudication and leg swelling.  Gastrointestinal: Negative for abdominal pain, blood in stool, constipation, diarrhea, heartburn, melena, nausea and vomiting.  Genitourinary: Negative.   Musculoskeletal: Negative for joint pain and myalgias.  Skin: Negative for rash.  Neurological: Negative for dizziness, tingling, sensory change, weakness and headaches.  Endo/Heme/Allergies: Negative for polydipsia.  Psychiatric/Behavioral: Negative.   All other systems reviewed and are negative.    Physical Exam: BP 140/78   Pulse 67   Temp (!) 97.5 F (36.4 C)   Wt 126 lb (57.2 kg)   SpO2 99%   BMI 26.33 kg/m  Wt Readings from Last 3 Encounters:  07/13/19 126 lb (57.2 kg)  04/12/19 124 lb (56.2 kg)  12/19/18 120 lb 12.8 oz (54.8 kg)   General Appearance: Well nourished, well dressed elder,  in no apparent distress. Eyes: PERRLA, EOMs, conjunctiva no swelling or erythema Sinuses: No Frontal/maxillary tenderness ENT/Mouth: Ext aud canals clear, TMs without erythema, bulging. No erythema, swelling, or exudate on post pharynx.  Tonsils not swollen or erythematous. Somewhat HOH Neck: Supple, thyroid normal.  Respiratory: Respiratory effort normal, BS equal bilaterally without rales, rhonchi, wheezing or stridor.  Cardio: RRR with no MRGs. Brisk peripheral pulses without edema.  Abdomen: Soft, + BS.  Non tender, no guarding, rebound, hernias, masses. Lymphatics: Non tender without lymphadenopathy.  Musculoskeletal: No obvious deformity, Full ROM, symmetrical strength, Ambulates slowly with walker Skin: Warm, dry without rashes, lesions, ecchymosis.  Neuro: Cranial nerves intact. No cerebellar symptoms.  Psych: Awake and oriented X 3, normal affect, Insight and Judgment appropriate.    Izora Ribas, NP 9:42 AM [facility]

## 2019-07-13 ENCOUNTER — Encounter: Payer: Self-pay | Admitting: Adult Health

## 2019-07-13 ENCOUNTER — Other Ambulatory Visit: Payer: Self-pay

## 2019-07-13 ENCOUNTER — Ambulatory Visit: Payer: Medicare Other | Admitting: Adult Health

## 2019-07-13 VITALS — BP 140/78 | HR 67 | Temp 97.5°F | Wt 126.0 lb

## 2019-07-13 DIAGNOSIS — Z79899 Other long term (current) drug therapy: Secondary | ICD-10-CM

## 2019-07-13 DIAGNOSIS — I10 Essential (primary) hypertension: Secondary | ICD-10-CM | POA: Diagnosis not present

## 2019-07-13 DIAGNOSIS — E1122 Type 2 diabetes mellitus with diabetic chronic kidney disease: Secondary | ICD-10-CM | POA: Diagnosis not present

## 2019-07-13 DIAGNOSIS — I2510 Atherosclerotic heart disease of native coronary artery without angina pectoris: Secondary | ICD-10-CM | POA: Diagnosis not present

## 2019-07-13 DIAGNOSIS — E119 Type 2 diabetes mellitus without complications: Secondary | ICD-10-CM | POA: Diagnosis not present

## 2019-07-13 DIAGNOSIS — E559 Vitamin D deficiency, unspecified: Secondary | ICD-10-CM

## 2019-07-13 DIAGNOSIS — N184 Chronic kidney disease, stage 4 (severe): Secondary | ICD-10-CM

## 2019-07-13 DIAGNOSIS — E782 Mixed hyperlipidemia: Secondary | ICD-10-CM

## 2019-07-13 NOTE — Patient Instructions (Addendum)
Goals    . Exercise 3x per week (20-30 min per time)        HOW TO SCHEDULE A MAMMOGRAM  [facility]  7 a.m.-6:30 p.m., Monday 7 a.m.-5 p.m., Tuesday-Friday Schedule an appointment by calling [phone].  [facility] Schedule an appointment by calling [phone].    Preventing High Cholesterol Cholesterol is a white, waxy substance similar to fat that the human body needs to help build cells. The liver makes all the cholesterol that a person's body needs. Having high cholesterol (hypercholesterolemia) increases a person's risk for heart disease and stroke. Extra (excess) cholesterol comes from the food the person eats. High cholesterol can often be prevented with diet and lifestyle changes. If you already have high cholesterol, you can control it with diet and lifestyle changes and with medicine. How can high cholesterol affect me? If you have high cholesterol, deposits (plaques) may build up on the walls of your arteries. The arteries are the blood vessels that carry blood away from your heart. Plaques make the arteries narrower and stiffer. This can limit or block blood flow and cause blood clots to form. Blood clots:  Are tiny balls of cells that form in your blood.  Can move to the heart or brain, causing a heart attack or stroke. Plaques in arteries greatly increase your risk for heart attack and stroke.Making diet and lifestyle changes can reduce your risk for these conditions that may threaten your life. What can increase my risk? This condition is more likely to develop in people who:  Eat foods that are high in saturated fat or cholesterol. Saturated fat is mostly found in: ? Foods that contain animal fat, such as red meat and some dairy products. ? Certain fatty foods made from plants, such as tropical oils.  Are overweight.  Are not getting enough exercise.  Have a family history of high cholesterol. What actions can I take  to prevent this? Nutrition   Eat less saturated fat.  Avoid trans fats (partially hydrogenated oils). These are often found in margarine and in some baked goods, fried foods, and snacks bought in packages.  Avoid precooked or cured meat, such as sausages or meat loaves.  Avoid foods and drinks that have added sugars.  Eat more fruits, vegetables, and whole grains.  Choose healthy sources of protein, such as fish, poultry, lean cuts of red meat, beans, peas, lentils, and nuts.  Choose healthy sources of fat, such as: ? Nuts. ? Vegetable oils, especially olive oil. ? Fish that have healthy fats (omega-3 fatty acids), such as mackerel or salmon. The items listed above may not be a complete list of recommended foods and beverages. Contact a dietitian for more information. Lifestyle  Lose weight if you are overweight. Losing 5-10 lb (2.3-4.5 kg) can help prevent or control high cholesterol. It can also lower your risk for diabetes and high blood pressure. Ask your health care provider to help you with a diet and exercise plan to lose weight safely.  Do not use any products that contain nicotine or tobacco, such as cigarettes, e-cigarettes, and chewing tobacco. If you need help quitting, ask your health care provider.  Limit your alcohol intake. ? Do not drink alcohol if:  Your health care provider tells you not to drink.  You are pregnant, may be pregnant, or are planning to become pregnant. ? If you drink alcohol:  Limit how much you use to:  0-1 drink a day for women.  0-2 drinks a day for men.  Be aware of how much alcohol is in your drink. In the [place] one drink equals one 12 oz bottle of beer (355 mL), one 5 oz glass of wine (148 mL), or one 1 oz glass of hard liquor (44 mL). Activity   Get enough exercise. Each week, do at least 150 minutes of exercise that takes a medium level of effort (moderate-intensity exercise). ? This is exercise that:  Makes your heart beat  faster and makes you breathe harder than usual.  Allows you to still be able to talk. ? You could exercise in short sessions several times a day or longer sessions a few times a week. For example, on 5 days each week, you could walk fast or ride your bike 3 times a day for 10 minutes each time.  Do exercises as told by your health care provider. Medicines  In addition to diet and lifestyle changes, your health care provider may recommend medicines to help lower cholesterol. This may be a medicine to lower the amount of cholesterol your liver makes. You may need medicine if: ? Diet and lifestyle changes do not lower your cholesterol enough. ? You have high cholesterol and other risk factors for heart disease or stroke.  Take over-the-counter and prescription medicines only as told by your health care provider. General information  Manage your risk factors for high cholesterol. Talk with your health care provider about all your risk factors and how to lower your risk.  Manage other conditions that you have, such as diabetes or high blood pressure (hypertension).  Have blood tests to check your cholesterol levels at regular points in time as told by your health care provider.  Keep all follow-up visits as told by your health care provider. This is important. Where to find more information  American Heart Association: www.heart.org  National Heart, Lung, and Blood Institute: https://wilson-eaton.com/ Summary  High cholesterol increases your risk for heart disease and stroke. By keeping your cholesterol level low, you can reduce your risk for these conditions.  High cholesterol can often be prevented with diet and lifestyle changes.  Work with your health care provider to manage your risk factors, and have your blood tested regularly. This information is not intended to replace advice given to you by your health care provider. Make sure you discuss any questions you have with your health care  provider. Document Released: 11/10/2015 Document Revised: 02/17/2019 Document Reviewed: 07/04/2016 Elsevier Patient Education  2020 [employer].

## 2019-07-14 LAB — CBC WITH DIFFERENTIAL/PLATELET
Absolute Monocytes: 720 cells/uL (ref 200–950)
Basophils Absolute: 29 cells/uL (ref 0–200)
Basophils Relative: 0.4 %
Eosinophils Absolute: 122 cells/uL (ref 15–500)
Eosinophils Relative: 1.7 %
HCT: 35.2 % (ref 35.0–45.0)
Hemoglobin: 11.9 g/dL (ref 11.7–15.5)
Lymphs Abs: 2858 cells/uL (ref 850–3900)
MCH: 31 pg (ref 27.0–33.0)
MCHC: 33.8 g/dL (ref 32.0–36.0)
MCV: 91.7 fL (ref 80.0–100.0)
MPV: 12.3 fL (ref 7.5–12.5)
Monocytes Relative: 10 %
Neutro Abs: 3470 cells/uL (ref 1500–7800)
Neutrophils Relative %: 48.2 %
Platelets: 169 10*3/uL (ref 140–400)
RBC: 3.84 10*6/uL (ref 3.80–5.10)
RDW: 12.4 % (ref 11.0–15.0)
Total Lymphocyte: 39.7 %
WBC: 7.2 10*3/uL (ref 3.8–10.8)

## 2019-07-14 LAB — LIPID PANEL
Cholesterol: 295 mg/dL — ABNORMAL HIGH (ref <200)
HDL: 49 mg/dL — ABNORMAL LOW (ref >=50)
LDL Cholesterol (Calc): 200 mg/dL (calc) — ABNORMAL HIGH
Non-HDL Cholesterol (Calc): 246 mg/dL (calc) — ABNORMAL HIGH (ref <130)
Total CHOL/HDL Ratio: 6 (calc) — ABNORMAL HIGH (ref <5.0)
Triglycerides: 260 mg/dL — ABNORMAL HIGH (ref <150)

## 2019-07-14 LAB — HEMOGLOBIN A1C
Hgb A1c MFr Bld: 6.3 % of total Hgb — ABNORMAL HIGH (ref <5.7)
Mean Plasma Glucose: 134 (calc)
eAG (mmol/L): 7.4 (calc)

## 2019-07-14 LAB — COMPLETE METABOLIC PANEL WITH GFR
AG Ratio: 1.5 (calc) (ref 1.0–2.5)
ALT: 10 U/L (ref 6–29)
AST: 14 U/L (ref 10–35)
Albumin: 4.3 g/dL (ref 3.6–5.1)
Alkaline phosphatase (APISO): 65 U/L (ref 37–153)
BUN/Creatinine Ratio: 18 (calc) (ref 6–22)
BUN: 32 mg/dL — ABNORMAL HIGH (ref 7–25)
CO2: 24 mmol/L (ref 20–32)
Calcium: 9.6 mg/dL (ref 8.6–10.4)
Chloride: 103 mmol/L (ref 98–110)
Creat: 1.76 mg/dL — ABNORMAL HIGH (ref 0.60–0.88)
GFR, Est African American: 29 mL/min/{1.73_m2} — ABNORMAL LOW (ref >=60)
GFR, Est Non African American: 25 mL/min/{1.73_m2} — ABNORMAL LOW (ref >=60)
Globulin: 2.8 g/dL (calc) (ref 1.9–3.7)
Glucose, Bld: 138 mg/dL — ABNORMAL HIGH (ref 65–99)
Potassium: 4.5 mmol/L (ref 3.5–5.3)
Sodium: 138 mmol/L (ref 135–146)
Total Bilirubin: 0.4 mg/dL (ref 0.2–1.2)
Total Protein: 7.1 g/dL (ref 6.1–8.1)

## 2019-07-14 LAB — PARATHYROID HORMONE, INTACT (NO CA): PTH: 57 pg/mL (ref 14–64)

## 2019-07-14 LAB — TSH: TSH: 2.37 mIU/L (ref 0.40–4.50)

## 2019-07-14 LAB — MAGNESIUM: Magnesium: 2.4 mg/dL (ref 1.5–2.5)

## 2019-09-04 ENCOUNTER — Other Ambulatory Visit: Payer: Self-pay | Admitting: Internal Medicine

## 2019-09-04 DIAGNOSIS — Z1231 Encounter for screening mammogram for malignant neoplasm of breast: Secondary | ICD-10-CM

## 2019-09-14 LAB — HM DIABETES EYE EXAM

## 2019-09-21 ENCOUNTER — Encounter: Payer: Self-pay | Admitting: *Deleted

## 2019-09-29 ENCOUNTER — Ambulatory Visit: Payer: Self-pay | Admitting: Physician Assistant

## 2019-10-01 ENCOUNTER — Other Ambulatory Visit: Payer: Self-pay | Admitting: Physician Assistant

## 2019-10-02 ENCOUNTER — Other Ambulatory Visit: Payer: Self-pay | Admitting: Internal Medicine

## 2019-10-15 ENCOUNTER — Encounter: Payer: Self-pay | Admitting: Internal Medicine

## 2019-10-15 NOTE — Patient Instructions (Signed)

## 2019-10-15 NOTE — Progress Notes (Signed)
History of Present Illness:      This very nice 85 y.o. WWF  presents for 6 month follow up with HTN, HLD, T2_NIDDM and Vitamin D Deficiency.       Patient is treated for HTN (2007) & BP has been controlled at home. Today's  . Patient has had no complaints of any cardiac type chest pain, palpitations, dyspnea / orthopnea / PND, dizziness, claudication, or dependent edema.      Hyperlipidemia is not controlled with diet & not treated aggressively for age. Patient denies myalgias or other med SE's. Last Lipids were not at goal:  Lab Results  Component Value Date   CHOL 295 (H) 07/13/2019   HDL 49 (L) 07/13/2019   LDLCALC 200 (H) 07/13/2019   TRIG 260 (H) 07/13/2019   CHOLHDL 6.0 (H) 07/13/2019        Also, the patient has history of T2_NIDDM (A1c 6.5% / 2016)  attempting control with diet and she has CKD3 (GFR 38) and has had no symptoms of reactive hypoglycemia, diabetic polys, paresthesias or visual blurring.  Last A1c was not at goal:  Lab Results  Component Value Date   HGBA1C 6.3 (H) 07/13/2019        Further, the patient also has history of Vitamin D Deficiency and supplements vitamin D without any suspected side-effects. Last vitamin D was at goal:  Lab Results  Component Value Date   VD25OH 66 04/12/2019    Current Outpatient Medications on File Prior to Visit  Medication Sig  . amLODipine (NORVASC) 5 MG tablet Take 1 tablet Daily for BP  . atenolol (TENORMIN) 50 MG tablet Take 1/2 tablet daily.  . Cholecalciferol (VITAMIN D) 2000 UNITS CAPS Take 6,000 Units by mouth daily.  . Magnesium 250 MG TABS Take by mouth 2 (two) times daily.  Marland Kitchen triamcinolone ointment (KENALOG) 0.1 % Apply 1 application topically 2 (two) times daily.   No current facility-administered medications on file prior to visit.     No Known Allergies  PMHx:   Past Medical History:  Diagnosis Date  . Arthritis   . HLD (hyperlipidemia)   . HTN (hypertension)    echo (11/11) with EF  55-60%, mild LV hypertrophy, PA systolic pressure 34 mmHg  . Low back pain   . Pre-diabetes    diet controlled  . Unspecified vitamin D deficiency    Immunization History  Administered Date(s) Administered  . Influenza Split 10/11/2013  . Influenza, High Dose Seasonal PF 09/18/2014, 09/20/2015, 08/04/2017, 09/16/2018  . Influenza, Seasonal, Injecte, Preservative Fre 10/15/2016  . Influenza-Unspecified 10/10/2012  . Pneumococcal Conjugate-13 12/19/2014  . Pneumococcal Polysaccharide-23 10/11/2013  . Tdap 09/21/2012   Past Surgical History:  Procedure Laterality Date  . ABDOMINAL HYSTERECTOMY     partial  . abnormal EKG    . BACK SURGERY    . cataract surgery    . colonoscopsy  2008   Neg  Dr Amedeo Plenty  . EYE SURGERY Bilateral    cataract  . JOINT REPLACEMENT     left hip replacement , right knee replacement   . lexiscan myoview  11/11   EF 81%, normal perfusion images suggesting no ischemia or infarction  . SPINE SURGERY     lumbar  . surgery for DDD    . TOTAL HIP ARTHROPLASTY  12/02/2012   Procedure: TOTAL HIP ARTHROPLASTY;  Surgeon: Gearlean Alf, MD;  Location: [facility];  Service: Orthopedics;  Laterality: Right;    FHx:  Reviewed / unchanged  SHx:    Reviewed / unchanged   Systems Review:  Constitutional: Denies fever, chills, wt changes, headaches, insomnia, fatigue, night sweats, change in appetite. Eyes: Denies redness, blurred vision, diplopia, discharge, itchy, watery eyes.  ENT: Denies discharge, congestion, post nasal drip, epistaxis, sore throat, earache, hearing loss, dental pain, tinnitus, vertigo, sinus pain, snoring.  CV: Denies chest pain, palpitations, irregular heartbeat, syncope, dyspnea, diaphoresis, orthopnea, PND, claudication or edema. Respiratory: denies cough, dyspnea, DOE, pleurisy, hoarseness, laryngitis, wheezing.  Gastrointestinal: Denies dysphagia, odynophagia, heartburn, reflux, water brash, abdominal pain or cramps, nausea, vomiting,  bloating, diarrhea, constipation, hematemesis, melena, hematochezia  or hemorrhoids. Genitourinary: Denies dysuria, frequency, urgency, nocturia, hesitancy, discharge, hematuria or flank pain. Musculoskeletal: Denies arthralgias, myalgias, stiffness, jt. swelling, pain, limping or strain/sprain.  Skin: Denies pruritus, rash, hives, warts, acne, eczema or change in skin lesion(s). Neuro: No weakness, tremor, incoordination, spasms, paresthesia or pain. Psychiatric: Denies confusion, memory loss or sensory loss. Endo: Denies change in weight, skin or hair change.  Heme/Lymph: No excessive bleeding, bruising or enlarged lymph nodes.  Physical Exam  There were no vitals taken for this visit.  Appears  well nourished, well groomed  and in no distress.  Eyes: PERRLA, EOMs, conjunctiva no swelling or erythema. Sinuses: No frontal/maxillary tenderness ENT/Mouth: EAC's clear, TM's nl w/o erythema, bulging. Nares clear w/o erythema, swelling, exudates. Oropharynx clear without erythema or exudates. Oral hygiene is good. Tongue normal, non obstructing. Hearing intact.  Neck: Supple. Thyroid not palpable. Car 2+/2+ without bruits, nodes or JVD. Chest: Respirations nl with BS clear & equal w/o rales, rhonchi, wheezing or stridor.  Cor: Heart sounds normal w/ regular rate and rhythm without sig. murmurs, gallops, clicks or rubs. Peripheral pulses normal and equal  without edema.  Abdomen: Soft & bowel sounds normal. Non-tender w/o guarding, rebound, hernias, masses or organomegaly.  Lymphatics: Unremarkable.  Musculoskeletal: Full ROM all peripheral extremities, joint stability, 5/5 strength and normal gait.  Skin: Warm, dry without exposed rashes or  lesions with ecchymosis over dorsal forearms.  Neuro: Cranial nerves intact, reflexes equal bilaterally. Sensory-motor testing grossly intact. Tendon reflexes grossly intact.  Pysch: Alert & oriented x 3.  Insight and judgement nl & appropriate. No  ideations.  Assessment and Plan:  1. Essential hypertension  - Continue medication, monitor blood pressure at home.  - Continue DASH diet.  Reminder to go to the ER if any CP,  SOB, nausea, dizziness, severe HA, changes vision/speech.  - CBC with Diff - COMPLETE METABOLIC PANEL WITH GFR - Magnesium - TSH  2. Hyperlipidemia associated with type 2 diabetes mellitus ([place])  - Continue diet/meds, exercise,& lifestyle modifications.  - Continue monitor periodic cholesterol/liver & renal functions   - TSH - Hemoglobin A1c ([facility]) - Insulin, random  3. Type 2 diabetes mellitus with stage 4 chronic kidney disease, without long-term current use of insulin ([facility])  - Continue diet, exercise  - Lifestyle modifications.  - Monitor appropriate labs.  - Hemoglobin A1c ([facility]) - Insulin, random  4. Vitamin D deficiency  - Continue supplementation.  - Vitamin D (25 hydroxy)  5. Senile purpura ([facility])  - CBC with Diff  6. Medication management  - CBC with Diff - COMPLETE METABOLIC PANEL WITH GFR - Magnesium - TSH - Hemoglobin A1c ([facility]) - Insulin, random - Vitamin D (25 hydroxy)        Discussed  regular exercise, BP monitoring, weight control to achieve/maintain BMI less than 25 and discussed med and SE's. Recommended labs  to assess and monitor clinical status with further disposition pending results of labs.  I discussed the assessment and treatment plan with the patient. The patient was provided an opportunity to ask questions and all were answered. The patient agreed with the plan and demonstrated an understanding of the instructions.  I provided over 30 minutes of exam, counseling, chart review and  complex critical decision making.  Kirtland Bouchard, MD

## 2019-10-16 ENCOUNTER — Ambulatory Visit: Payer: Medicare Other | Admitting: Internal Medicine

## 2019-10-16 ENCOUNTER — Other Ambulatory Visit: Payer: Self-pay

## 2019-10-16 VITALS — BP 134/60 | HR 80 | Temp 97.4°F | Resp 16 | Ht <= 58 in | Wt 125.8 lb

## 2019-10-16 DIAGNOSIS — D692 Other nonthrombocytopenic purpura: Secondary | ICD-10-CM

## 2019-10-16 DIAGNOSIS — E1122 Type 2 diabetes mellitus with diabetic chronic kidney disease: Secondary | ICD-10-CM

## 2019-10-16 DIAGNOSIS — E559 Vitamin D deficiency, unspecified: Secondary | ICD-10-CM | POA: Diagnosis not present

## 2019-10-16 DIAGNOSIS — Z23 Encounter for immunization: Secondary | ICD-10-CM

## 2019-10-16 DIAGNOSIS — N184 Chronic kidney disease, stage 4 (severe): Secondary | ICD-10-CM

## 2019-10-16 DIAGNOSIS — Z79899 Other long term (current) drug therapy: Secondary | ICD-10-CM

## 2019-10-16 DIAGNOSIS — E1169 Type 2 diabetes mellitus with other specified complication: Secondary | ICD-10-CM | POA: Diagnosis not present

## 2019-10-16 DIAGNOSIS — I10 Essential (primary) hypertension: Secondary | ICD-10-CM

## 2019-10-16 DIAGNOSIS — E785 Hyperlipidemia, unspecified: Secondary | ICD-10-CM

## 2019-10-17 LAB — HEMOGLOBIN A1C
Hgb A1c MFr Bld: 6.6 % of total Hgb — ABNORMAL HIGH (ref <5.7)
Mean Plasma Glucose: 143 (calc)
eAG (mmol/L): 7.9 (calc)

## 2019-10-17 LAB — TSH: TSH: 2.42 mIU/L (ref 0.40–4.50)

## 2019-10-17 LAB — CBC WITH DIFFERENTIAL/PLATELET
Absolute Monocytes: 725 cells/uL (ref 200–950)
Basophils Absolute: 37 cells/uL (ref 0–200)
Basophils Relative: 0.5 %
Eosinophils Absolute: 118 cells/uL (ref 15–500)
Eosinophils Relative: 1.6 %
HCT: 36.3 % (ref 35.0–45.0)
Hemoglobin: 12.1 g/dL (ref 11.7–15.5)
Lymphs Abs: 2590 cells/uL (ref 850–3900)
MCH: 30.6 pg (ref 27.0–33.0)
MCHC: 33.3 g/dL (ref 32.0–36.0)
MCV: 91.9 fL (ref 80.0–100.0)
MPV: 12 fL (ref 7.5–12.5)
Monocytes Relative: 9.8 %
Neutro Abs: 3929 cells/uL (ref 1500–7800)
Neutrophils Relative %: 53.1 %
Platelets: 181 10*3/uL (ref 140–400)
RBC: 3.95 10*6/uL (ref 3.80–5.10)
RDW: 12.1 % (ref 11.0–15.0)
Total Lymphocyte: 35 %
WBC: 7.4 10*3/uL (ref 3.8–10.8)

## 2019-10-17 LAB — COMPLETE METABOLIC PANEL WITH GFR
AG Ratio: 1.6 (calc) (ref 1.0–2.5)
ALT: 9 U/L (ref 6–29)
AST: 14 U/L (ref 10–35)
Albumin: 4.4 g/dL (ref 3.6–5.1)
Alkaline phosphatase (APISO): 72 U/L (ref 37–153)
BUN/Creatinine Ratio: 17 (calc) (ref 6–22)
BUN: 31 mg/dL — ABNORMAL HIGH (ref 7–25)
CO2: 24 mmol/L (ref 20–32)
Calcium: 9.6 mg/dL (ref 8.6–10.4)
Chloride: 103 mmol/L (ref 98–110)
Creat: 1.82 mg/dL — ABNORMAL HIGH (ref 0.60–0.88)
GFR, Est African American: 27 mL/min/{1.73_m2} — ABNORMAL LOW (ref >=60)
GFR, Est Non African American: 24 mL/min/{1.73_m2} — ABNORMAL LOW (ref >=60)
Globulin: 2.8 g/dL (calc) (ref 1.9–3.7)
Glucose, Bld: 159 mg/dL — ABNORMAL HIGH (ref 65–99)
Potassium: 4.3 mmol/L (ref 3.5–5.3)
Sodium: 139 mmol/L (ref 135–146)
Total Bilirubin: 0.4 mg/dL (ref 0.2–1.2)
Total Protein: 7.2 g/dL (ref 6.1–8.1)

## 2019-10-17 LAB — INSULIN, RANDOM: Insulin: 13.3 u[IU]/mL

## 2019-10-17 LAB — MAGNESIUM: Magnesium: 2.6 mg/dL — ABNORMAL HIGH (ref 1.5–2.5)

## 2019-10-17 LAB — VITAMIN D 25 HYDROXY (VIT D DEFICIENCY, FRACTURES): Vit D, 25-Hydroxy: 68 ng/mL (ref 30–100)

## 2019-10-23 ENCOUNTER — Ambulatory Visit
Admission: RE | Admit: 2019-10-23 | Discharge: 2019-10-23 | Disposition: A | Discharge status: Home / Self Care | Payer: Medicare Other | Source: Ambulatory Visit | Attending: Internal Medicine | Admitting: Internal Medicine

## 2019-10-23 ENCOUNTER — Other Ambulatory Visit: Payer: Self-pay

## 2019-10-23 DIAGNOSIS — Z1231 Encounter for screening mammogram for malignant neoplasm of breast: Secondary | ICD-10-CM

## 2019-12-25 ENCOUNTER — Ambulatory Visit: Payer: Self-pay | Admitting: Physician Assistant

## 2020-01-21 ENCOUNTER — Other Ambulatory Visit: Payer: Self-pay | Admitting: Physician Assistant

## 2020-01-21 DIAGNOSIS — I10 Essential (primary) hypertension: Secondary | ICD-10-CM

## 2020-01-23 DIAGNOSIS — E1122 Type 2 diabetes mellitus with diabetic chronic kidney disease: Secondary | ICD-10-CM | POA: Insufficient documentation

## 2020-01-23 DIAGNOSIS — N184 Chronic kidney disease, stage 4 (severe): Secondary | ICD-10-CM | POA: Insufficient documentation

## 2020-01-23 DIAGNOSIS — E785 Hyperlipidemia, unspecified: Secondary | ICD-10-CM | POA: Insufficient documentation

## 2020-01-23 DIAGNOSIS — E1169 Type 2 diabetes mellitus with other specified complication: Secondary | ICD-10-CM | POA: Insufficient documentation

## 2020-01-23 NOTE — Progress Notes (Signed)
MEDICARE ANNUAL WELLNESS VISIT AND FOLLOW UP  Assessment:   Essential hypertension -recommended atenolol in PM  - CONTROLLED OFF ACE, DOING WELL ON NORVASC- continue the same -DASH diet -cont exercise as tolerated - TSH   Hyperlipidemia -cont diet and exercise -given age is not a candidate for statins - Lipid panel  Vitamin D deficiency -cont Vit D supplement  Medication management - CBC with Differential/Platelet - BASIC METABOLIC PANEL WITH GFR - Hepatic function panel   Primary osteoarthritis of both hips -cont movement and exercise as tolerated   Medicare annual wellness visit, subsequent -due next year.  Seniles purpura Discussed process, protect skin, sunscreen  Type 2 diabetes mellitus with stage 4 chronic kidney disease, without long-term current use of insulin (HCC) -     Hemoglobin A1c -cont diet and exercise -UTD with eye exams - Hemoglobin A1c  CKD stage 4 due to type 2 diabetes mellitus (HCC) -     COMPLETE METABOLIC PANEL WITH GFR - - OFF ACE DUE TO KIDNEY FUNCTION  ASHD (arteriosclerotic heart disease) Control blood pressure, cholesterol, glucose, increase exercise.  No chest pain     Over 30 minutes of exam, counseling, chart review, and critical decision making was performed Future Appointments  Date Time Provider [place]  05/10/2020 10:00 AM Unk Pinto, MD [facility] None  02/04/2021  9:00 AM Vicie Mutters, PA-C [facility] None     Plan:   During the course of the visit the patient was educated and counseled about appropriate screening and preventive services including:    Pneumococcal vaccine   Influenza vaccine  Td vaccine  Prevnar 13  Screening electrocardiogram  Screening mammography  Bone densitometry screening  Colorectal cancer screening  Diabetes screening  Glaucoma screening  Nutrition counseling   Advanced directives: given info/requested copies  Subjective:   Jeanette Espinoza is a 86  y.o. female who presents for Medicare Annual Wellness Visit and 3 month follow up on hypertension, prediabetes, hyperlipidemia, vitamin D def.   She is at [facility], uses her walker. [facility] brought her today, she still drives but only to a grocery store [place] to [place]. She is hard of hearing, no hearing aids.  No depression/anxiety, her older sister of 9 kids is not doing well in [place].   Her blood pressure has been controlled at home, she is off ACE due to kidney function and on norvasc now and atenolol in the PM, today their BP is BP: 120/64 She does not workout. She denies chest pain, shortness of breath, dizziness.   She is not on cholesterol medication. Her cholesterol is not at goal. The cholesterol last visit was:  She is off chol due to age and myalgias.  Lab Results  Component Value Date   CHOL 295 (H) 07/13/2019   HDL 49 (L) 07/13/2019   LDLCALC 200 (H) 07/13/2019   TRIG 260 (H) 07/13/2019   CHOLHDL 6.0 (H) 07/13/2019   She does have diabetes, she has had an A1C as high as 7.1 in the past but she is controlling it with diet and exercise.  She notes that she is asymptomatic. Lab Results  Component Value Date   HGBA1C 6.6 (H) 10/16/2019   She has CKD stage 4, she was taken off her enalapril due to elevated potassium, she is on norvasc now Last GFR Lab Results  Component Value Date   GFRNONAA 24 (L) 10/16/2019   Patient is on Vitamin D supplement. Lab Results  Component Value Date   VD25OH  68 10/16/2019     BMI is Body mass index is 26.75 kg/m., she is working on diet and exercise. Wt Readings from Last 3 Encounters:  01/25/20 128 lb (58.1 kg)  10/16/19 125 lb 12.8 oz (57.1 kg)  07/13/19 126 lb (57.2 kg)    Medication Review Current Outpatient Medications on File Prior to Visit  Medication Sig Dispense Refill  . amLODipine (NORVASC) 5 MG tablet Take 1 tablet Daily for BP 90 tablet 3  . Cholecalciferol (VITAMIN D) 2000 UNITS CAPS Take 6,000 Units  by mouth daily.    . Magnesium 250 MG TABS Take by mouth 2 (two) times daily.    Marland Kitchen triamcinolone ointment (KENALOG) 0.1 % Apply 1 application topically 2 (two) times daily. 80 g 1   No current facility-administered medications on file prior to visit.    Current Problems (verified) Patient Active Problem List   Diagnosis Date Noted  . Type 2 diabetes mellitus with stage 4 chronic kidney disease, without long-term current use of insulin ([place]) 01/23/2020  . Hyperlipidemia associated with type 2 diabetes mellitus ([place]) 01/23/2020  . Pain due to onychomycosis of toenails of both feet 06/09/2019  . Senile purpura ([place]) 09/16/2018  . ASHD (arteriosclerotic heart disease) 09/14/2018  . CKD stage 4 due to type 2 diabetes mellitus ([place]) 11/03/2017  . Essential hypertension 01/02/2016  . Medication management 02/06/2014  . Diet-controlled diabetes mellitus ([place]) 02/06/2014  . Hyperlipemia   . Vitamin D deficiency   . OA (osteoarthritis) of hip 12/02/2012    Screening Tests Immunization History  Administered Date(s) Administered  . Influenza Split 10/11/2013  . Influenza, High Dose Seasonal PF 09/18/2014, 09/20/2015, 08/04/2017, 09/16/2018, 10/16/2019  . Influenza, Seasonal, Injecte, Preservative Fre 10/15/2016  . Influenza-Unspecified 10/10/2012  . Moderna SARS-COVID-2 Vaccination 11/27/2019, 12/28/2019  . Pneumococcal Conjugate-13 12/19/2014  . Pneumococcal Polysaccharide-23 10/11/2013  . Tdap 09/21/2012    Preventative care: Last colonoscopy: 2008 will not get another Last mammogram: 2017- will not get another  Prior vaccinations: TD or Tdap: 2013  Influenza: 2019 Pneumococcal: 2014 Prevnar13: 2016 Shingles/Zostavax: Declined  Names of Other Physician/Practitioners you currently use: 1. [facility]- here for primary care 2. Dr. Sabra Heck, eye doctor, last visit Nov 2019- will request 3. Dr. Lavone Neri , dentist, last visit Oct 2019 Patient  Care Team: Unk Pinto, MD as PCP - General (Internal Medicine) Gaynelle Arabian, MD as Consulting Physician (Orthopedic Surgery) Harrie Foreman, [place] as Referring Physician (Optometry) Teena Irani, MD (Inactive) as Consulting Physician (Gastroenterology) Suella Broad, MD as Consulting Physician (Physical Medicine and Rehabilitation) Almyra Deforest, [place] as Consulting Physician (Cardiology)  Allergies No Known Allergies  SURGICAL HISTORY She  has a past surgical history that includes lexiscan myoview (11/11); surgery for DDD; cataract surgery; abnormal EKG; Back surgery; Total hip arthroplasty (12/02/2012); Eye surgery (Bilateral); Spine surgery; Abdominal hysterectomy; Joint replacement; and colonoscopsy (2008). FAMILY HISTORY Her family history includes Diabetes in an other family member; Heart disease in her mother. SOCIAL HISTORY She  reports that she quit smoking about 49 years ago. She has never used smokeless tobacco. She reports current alcohol use. She reports that she does not use drugs.   MEDICARE WELLNESS OBJECTIVES: Physical activity: Exercise limited by: orthopedic condition(s) Cardiac risk factors: Cardiac Risk Factors include: advanced age (>73men, >60 women);diabetes mellitus;dyslipidemia;hypertension;sedentary lifestyle Depression/mood screen:   Depression screen [facility] 2/9 01/25/2020  Decreased Interest 0  Down, Depressed, Hopeless 0  PHQ - 2 Score 0    ADLs:  In your present  state of health, do you have any difficulty performing the following activities: 01/25/2020 10/15/2019  Hearing? Y N  Vision? N N  Difficulty concentrating or making decisions? N N  Walking or climbing stairs? Y N  Dressing or bathing? N N  Doing errands, shopping? Y N  Comment [facility] will drive her -  Conservation officer, nature and eating ? Y -  Using the Toilet? Y -  In the past six months, have you accidently leaked urine? Y -  Do you have problems with loss of bowel control? N -  Managing your  Medications? N -  Managing your Finances? Y -  Housekeeping or managing your Housekeeping? Y -  Some recent data might be hidden     Cognitive Testing  Alert? Yes  Normal Appearance?Yes  Oriented to person? Yes  Place? Yes   Time? Yes  Recall of three objects?  Yes  Can perform simple calculations? Yes  Displays appropriate judgment?Yes  Can read the correct time from a watch face?Yes  EOL planning: Does Patient Have a Medical Advance Directive?: Yes Type of Advance Directive: Healthcare Power of Attorney, Living will Copy of [place] in Chart?: No - copy requested   Objective:   Today's Vitals   01/25/20 0851  BP: 120/64  Pulse: 77  Temp: 97.7 F (36.5 C)  SpO2: 98%  Weight: 128 lb (58.1 kg)  Height: 4\' 10"  (1.473 m)  PainSc: 0-No pain   Body mass index is 26.75 kg/m.  General appearance: alert, no distress, WD/WN,  female HEENT: normocephalic, sclerae anicteric, TMs pearly, nares patent, no discharge or erythema, pharynx normal Oral cavity: MMM, no lesions Neck: supple, no lymphadenopathy, no thyromegaly, no masses Heart: RRR, normal S1, S2, no murmurs Lungs: CTA bilaterally, no wheezes, rhonchi, or rales Abdomen: +bs, soft, non tender, non distended, no masses, no hepatomegaly, no splenomegaly Musculoskeletal: nontender, no swelling, no obvious deformity Extremities: no edema, no cyanosis, no clubbing Pulses: 2+ symmetric, upper and lower extremities, normal cap refill Neurological: alert, oriented x 3, CN2-12 intact, strength normal upper extremities and lower extremities, sensation normal throughout, DTRs 2+ throughout, no cerebellar signs, gait normal with walker Psychiatric: normal affect, behavior normal, pleasant  Breast: defer Gyn: defer Rectal: defer   Medicare Attestation I have personally reviewed: The patient's medical and social history Their use of alcohol, tobacco or illicit drugs Their current medications and  supplements The patient's functional ability including ADLs,fall risks, home safety risks, cognitive, and hearing and visual impairment Diet and physical activities Evidence for depression or mood disorders  The patient's weight, height, BMI, and visual acuity have been recorded in the chart.  I have made referrals, counseling, and provided education to the patient based on review of the above and I have provided the patient with a written personalized care plan for preventive services.     Vicie Mutters, PA-C   01/25/2020

## 2020-01-25 ENCOUNTER — Ambulatory Visit: Payer: Medicare Other | Admitting: Physician Assistant

## 2020-01-25 ENCOUNTER — Encounter: Payer: Self-pay | Admitting: Physician Assistant

## 2020-01-25 ENCOUNTER — Other Ambulatory Visit: Payer: Self-pay

## 2020-01-25 VITALS — BP 120/64 | HR 77 | Temp 97.7°F | Ht <= 58 in | Wt 128.0 lb

## 2020-01-25 DIAGNOSIS — I2510 Atherosclerotic heart disease of native coronary artery without angina pectoris: Secondary | ICD-10-CM

## 2020-01-25 DIAGNOSIS — E1169 Type 2 diabetes mellitus with other specified complication: Secondary | ICD-10-CM

## 2020-01-25 DIAGNOSIS — M160 Bilateral primary osteoarthritis of hip: Secondary | ICD-10-CM

## 2020-01-25 DIAGNOSIS — E1122 Type 2 diabetes mellitus with diabetic chronic kidney disease: Secondary | ICD-10-CM | POA: Diagnosis not present

## 2020-01-25 DIAGNOSIS — R6889 Other general symptoms and signs: Secondary | ICD-10-CM

## 2020-01-25 DIAGNOSIS — N184 Chronic kidney disease, stage 4 (severe): Secondary | ICD-10-CM

## 2020-01-25 DIAGNOSIS — M79675 Pain in left toe(s): Secondary | ICD-10-CM

## 2020-01-25 DIAGNOSIS — I10 Essential (primary) hypertension: Secondary | ICD-10-CM

## 2020-01-25 DIAGNOSIS — D692 Other nonthrombocytopenic purpura: Secondary | ICD-10-CM

## 2020-01-25 DIAGNOSIS — M79674 Pain in right toe(s): Secondary | ICD-10-CM

## 2020-01-25 DIAGNOSIS — Z0001 Encounter for general adult medical examination with abnormal findings: Secondary | ICD-10-CM

## 2020-01-25 DIAGNOSIS — Z79899 Other long term (current) drug therapy: Secondary | ICD-10-CM

## 2020-01-25 DIAGNOSIS — E559 Vitamin D deficiency, unspecified: Secondary | ICD-10-CM

## 2020-01-25 DIAGNOSIS — E119 Type 2 diabetes mellitus without complications: Secondary | ICD-10-CM

## 2020-01-25 DIAGNOSIS — B351 Tinea unguium: Secondary | ICD-10-CM

## 2020-01-25 DIAGNOSIS — E785 Hyperlipidemia, unspecified: Secondary | ICD-10-CM

## 2020-01-25 DIAGNOSIS — Z0000 Encounter for general adult medical examination without abnormal findings: Secondary | ICD-10-CM

## 2020-01-25 MED ORDER — ATENOLOL 50 MG PO TABS: ORAL_TABLET | ORAL | 3 refills | Status: DC

## 2020-01-25 NOTE — Patient Instructions (Addendum)
Aims to exercise AT least 10 mins a day, this helps circulate blood and in a study has decreased dementia risk.  Keep your mind engaged! Try meditation and relaxation techniques Get hearing aids   Diabetes or even increased sugars put you at 300% increased risk of heart attack and stroke.  ALSO BEING DIABETIC YOU MAY NOT HAVE ANY PAIN WITH A HEART ATTACK.  Even worse of a chance of no pain if you are a woman.  It is very unlikely that you will have any pain with a heart attack. Likely your symptoms will be very subtle, even for very severe disease.  Your symptoms for a heart attack will likely occur when you exert your self or exercise and include: Shortness of breath Sweating Nausea Dizziness Fast or irregular heart beats Fatigue   It makes me feel better if my diabetics get their heart rate up with exercise once or twice a week and pay close attention to your body. If there is ANY change in your exercise capacity or if you have symptoms above, please STOP and call 911 or call to come to the office.   PLEASE REMEMBER:  Diabetes is preventable! Up to 43 percent of complications and morbidities among individuals with type 2 diabetes can be prevented, delayed, or effectively treated and minimized with regular visits to a health professional, appropriate monitoring and medication, and a healthy diet and lifestyle.   Here is some information to help you keep your heart healthy: Move it! - Aim for 30 mins of activity every day. Take it slowly at first. Talk to [place] before starting any new exercise program.   Lose it.  -Body Mass Index (BMI) can indicate if you need to lose weight. A healthy range is 18.5-24.9. For a BMI calculator, go to [employer]  Waist Management -Excess abdominal fat is a risk factor for heart disease, diabetes, asthma, stroke and more. Ideal waist circumference is less than 35" for women and less than 40" for men.   Eat Right -focus on fruits, vegetables, whole  grains, and meals you make yourself. Avoid foods with trans fat and high sugar/sodium content.   Snooze or Snore? - Loud snoring can be a sign of sleep apnea, a significant risk factor for high blood pressure, heart attach, stroke, and heart arrhythmias.  Kick the habit -Quit Smoking! Avoid second hand smoke. A single cigarette raises your blood pressure for 20 mins and increases the risk of heart attack and stroke for the next 24 hours.   Are Aspirin and Supplements right for you? -Add ENTERIC COATED low dose 81 mg Aspirin daily OR can do every other day if you have easy bruising to protect your heart and head. As well as to reduce risk of Colon Cancer by 20 %, Skin Cancer by 26 % , Melanoma by 46% and Pancreatic cancer by 60%  Say "No to Stress -There may be little you can do about problems that cause stress. However, techniques such as long walks, meditation, and exercise can help you manage it.   Start Now! - Make changes one at a time and set reasonable goals to increase your likelihood of success.      VENOUS INSUFFICIENCY Our lower leg venous system is not the most reliable, the heart does NOT pump fluid up, there is a valve system.  The muscles of the leg squeeze and the blood moves up and a valve opens and close, then they squeeze, blood moves up and valves open and  closes keeping the blood moving towards the heart.  Lots can go wrong with this valve system.  If someone is sitting or standing without movement, everyone will get swelling.  THINGS TO DO:  Do not stand or sit in one position for long periods of time. Do not sit with your legs crossed. Rest with your legs raised during the day.  Your legs have to be higher than your heart so that gravity will force the valves to open, so please really elevate your legs.   Wear elastic stockings or support hose. Do not wear other tight, encircling garments around the legs, pelvis, or waist.  [employer]  has a wide variety of  well priced compression stockings. [place], [place] [id number] [id number] [place] has a good cheap selection, I like the socks, they are not as hard to get on  Walk as much as possible to increase blood flow.  Raise the foot of your bed at night with 2-inch blocks.  SEEK MEDICAL CARE IF:   The skin around your ankle starts to break down.  You have pain, redness, tenderness, or hard swelling developing in your leg over a vein.  You are uncomfortable due to leg pain.  If you ever have shortness of breath with exertion or chest pain go to the ER.

## 2020-01-26 LAB — CBC WITH DIFFERENTIAL/PLATELET
Absolute Monocytes: 825 cells/uL (ref 200–950)
Basophils Absolute: 29 cells/uL (ref 0–200)
Basophils Relative: 0.4 %
Eosinophils Absolute: 117 cells/uL (ref 15–500)
Eosinophils Relative: 1.6 %
HCT: 36.7 % (ref 35.0–45.0)
Hemoglobin: 12.2 g/dL (ref 11.7–15.5)
Lymphs Abs: 2519 cells/uL (ref 850–3900)
MCH: 30.9 pg (ref 27.0–33.0)
MCHC: 33.2 g/dL (ref 32.0–36.0)
MCV: 92.9 fL (ref 80.0–100.0)
MPV: 11.7 fL (ref 7.5–12.5)
Monocytes Relative: 11.3 %
Neutro Abs: 3811 cells/uL (ref 1500–7800)
Neutrophils Relative %: 52.2 %
Platelets: 177 10*3/uL (ref 140–400)
RBC: 3.95 10*6/uL (ref 3.80–5.10)
RDW: 12.5 % (ref 11.0–15.0)
Total Lymphocyte: 34.5 %
WBC: 7.3 10*3/uL (ref 3.8–10.8)

## 2020-01-26 LAB — COMPLETE METABOLIC PANEL WITH GFR
AG Ratio: 1.6 (calc) (ref 1.0–2.5)
ALT: 10 U/L (ref 6–29)
AST: 13 U/L (ref 10–35)
Albumin: 4.4 g/dL (ref 3.6–5.1)
Alkaline phosphatase (APISO): 82 U/L (ref 37–153)
BUN/Creatinine Ratio: 20 (calc) (ref 6–22)
BUN: 37 mg/dL — ABNORMAL HIGH (ref 7–25)
CO2: 27 mmol/L (ref 20–32)
Calcium: 9.3 mg/dL (ref 8.6–10.4)
Chloride: 104 mmol/L (ref 98–110)
Creat: 1.85 mg/dL — ABNORMAL HIGH (ref 0.60–0.88)
GFR, Est African American: 27 mL/min/{1.73_m2} — ABNORMAL LOW (ref >=60)
GFR, Est Non African American: 23 mL/min/{1.73_m2} — ABNORMAL LOW (ref >=60)
Globulin: 2.7 g/dL (calc) (ref 1.9–3.7)
Glucose, Bld: 145 mg/dL — ABNORMAL HIGH (ref 65–99)
Potassium: 4.6 mmol/L (ref 3.5–5.3)
Sodium: 139 mmol/L (ref 135–146)
Total Bilirubin: 0.3 mg/dL (ref 0.2–1.2)
Total Protein: 7.1 g/dL (ref 6.1–8.1)

## 2020-01-26 LAB — MAGNESIUM: Magnesium: 3 mg/dL — ABNORMAL HIGH (ref 1.5–2.5)

## 2020-01-26 LAB — HEMOGLOBIN A1C
Hgb A1c MFr Bld: 6.5 % of total Hgb — ABNORMAL HIGH (ref <5.7)
Mean Plasma Glucose: 140 (calc)
eAG (mmol/L): 7.7 (calc)

## 2020-01-26 LAB — TSH: TSH: 2.14 mIU/L (ref 0.40–4.50)

## 2020-05-09 ENCOUNTER — Encounter: Payer: Self-pay | Admitting: Internal Medicine

## 2020-05-09 NOTE — Patient Instructions (Signed)

## 2020-05-09 NOTE — Progress Notes (Addendum)
Annual Screening/Preventative Visit & Comprehensive Evaluation &  Examination     This very nice 86 y.o. WWF presents for a Screening /Preventative Visit & comprehensive evaluation and management of multiple medical co-morbidities.  Patient has been followed for HTN, HLD, T2_NIDDM  and Vitamin D Deficiency.      HTN predates since  2007. Patient's BP has been controlled at home and patient denies any cardiac symptoms as chest pain, palpitations, shortness of breath, dizziness or ankle swelling. Today's BP is at goal - 138/66.      Patient's hyperlipidemia is not controlled with diet & not treated aggressively for age.   Last lipids were not at goal:  Lab Results  Component Value Date   CHOL 295 (H) 07/13/2019   HDL 49 (L) 07/13/2019   LDLCALC 200 (H) 07/13/2019   TRIG 260 (H) 07/13/2019   CHOLHDL 6.0 (H) 07/13/2019       Patient has hx/o T2_NIDDM (A1c 6.5% / 2016) w/CKD4 (GFR 23) managing with diet. Patient denies reactive hypoglycemic symptoms, visual blurring, diabetic polys or paresthesias. Last A1c was not at goal:  Lab Results  Component Value Date   HGBA1C 6.5 (H) 01/25/2020       Finally, patient has history of Vitamin D Deficiency and last Vitamin D was at goal:  Lab Results  Component Value Date   VD25OH 68 10/16/2019    Current Outpatient Medications on File Prior to Visit  Medication Sig  . amLODipine (NORVASC) 5 MG tablet Take 1 tablet Daily for BP  . atenolol (TENORMIN) 50 MG tablet Take 1/2  tablet Daily for BP  . Cholecalciferol (VITAMIN D) 2000 UNITS CAPS Take 6,000 Units by mouth daily.  . Magnesium 250 MG TABS Take by mouth 2 (two) times daily.  Marland Kitchen triamcinolone ointment (KENALOG) 0.1 % Apply 1 application topically 2 (two) times daily.   No current facility-administered medications on file prior to visit.   No Known Allergies   Past Medical History:  Diagnosis Date  . Arthritis   . HLD (hyperlipidemia)   . HTN (hypertension)    echo (11/11) with EF  55-60%, mild LV hypertrophy, PA systolic pressure 34 mmHg  . Low back pain   . Pre-diabetes    diet controlled  . Unspecified vitamin D deficiency    Health Maintenance  Topic Date Due  . DEXA SCAN  Never done  . URINE MICROALBUMIN  04/11/2020  . INFLUENZA VACCINE  06/09/2020  . HEMOGLOBIN A1C  07/27/2020  . OPHTHALMOLOGY EXAM  09/13/2020  . FOOT EXAM  05/09/2021  . TETANUS/TDAP  09/21/2022  . COVID-19 Vaccine  Completed  . PNA vac Low Risk Adult  Completed   Immunization History  Administered Date(s) Administered  . Influenza Split 10/11/2013  . Influenza, High Dose Seasonal PF 09/18/2014, 09/20/2015, 08/04/2017, 09/16/2018, 10/16/2019  . Influenza, Seasonal, Injecte, Preservative Fre 10/15/2016  . Influenza-Unspecified 10/10/2012  . Moderna SARS-COVID-2 Vaccination 11/27/2019, 12/28/2019  . Pneumococcal Conjugate-13 12/19/2014  . Pneumococcal Polysaccharide-23 10/11/2013  . Tdap 09/21/2012    Last Colon - 2008 - Dr Amedeo Plenty -> aged out  Last MGM - 10/23/2019  Past Surgical History:  Procedure Laterality Date  . ABDOMINAL HYSTERECTOMY     partial  . abnormal EKG    . BACK SURGERY    . cataract surgery    . colonoscopsy  2008   Neg  Dr Amedeo Plenty  . EYE SURGERY Bilateral    cataract  . JOINT REPLACEMENT     left hip replacement ,  right knee replacement   . lexiscan myoview  11/11   EF 81%, normal perfusion images suggesting no ischemia or infarction  . SPINE SURGERY     lumbar  . surgery for DDD    . TOTAL HIP ARTHROPLASTY  12/02/2012   Procedure: TOTAL HIP ARTHROPLASTY;  Surgeon: Gearlean Alf, MD;  Location: [facility];  Service: Orthopedics;  Laterality: Right;   Family History  Problem Relation Age of Onset  . Heart disease Mother   . Diabetes Other        DM - sibiling   . Coronary artery disease Neg Hx        no premature CAd    Social History   Tobacco Use  . Smoking status: Former Smoker    Quit date: 11/09/1970    Years since quitting: 49.5  .  Smokeless tobacco: Never Used  Substance Use Topics  . Alcohol use: Yes    Comment: occasional   . Drug use: No    ROS Constitutional: Denies fever, chills, weight loss/gain, headaches, insomnia,  night sweats, and change in appetite. Does c/o fatigue. Eyes: Denies redness, blurred vision, diplopia, discharge, itchy, watery eyes.  ENT: Denies discharge, congestion, post nasal drip, epistaxis, sore throat, earache, hearing loss, dental pain, Tinnitus, Vertigo, Sinus pain, snoring.  Cardio: Denies chest pain, palpitations, irregular heartbeat, syncope, dyspnea, diaphoresis, orthopnea, PND, claudication, edema Respiratory: denies cough, dyspnea, DOE, pleurisy, hoarseness, laryngitis, wheezing.  Gastrointestinal: Denies dysphagia, heartburn, reflux, water brash, pain, cramps, nausea, vomiting, bloating, diarrhea, constipation, hematemesis, melena, hematochezia, jaundice, hemorrhoids Genitourinary: Denies dysuria, frequency, urgency, nocturia, hesitancy, discharge, hematuria, flank pain Breast: Breast lumps, nipple discharge, bleeding.  Musculoskeletal: Denies arthralgia, myalgia, stiffness, Jt. Swelling, pain, limp, and strain/sprain. Denies falls. Skin: Denies puritis, rash, hives, warts, acne, eczema, changing in skin lesion Neuro: No weakness, tremor, incoordination, spasms, paresthesia, pain Psychiatric: Denies confusion, memory loss, sensory loss. Denies Depression. Endocrine: Denies change in weight, skin, hair change, nocturia, and paresthesia, diabetic polys, visual blurring, hyper / hypo glycemic episodes.  Heme/Lymph: No excessive bleeding, bruising, enlarged lymph nodes.  Physical Exam  BP 138/66   Pulse 79   Temp (!) 97.2 F (36.2 C)   Resp 16   Ht 4\' 10"  (1.473 m)   Wt 131 lb 12.8 oz (59.8 kg)   SpO2 98%   BMI 27.55 kg/m   General Appearance: Well nourished, well groomed and in no apparent distress.  Eyes: PERRLA, EOMs, conjunctiva no swelling or erythema, normal fundi  and vessels. Sinuses: No frontal/maxillary tenderness ENT/Mouth: EACs patent / TMs  nl. Nares clear without erythema, swelling, mucoid exudates. Oral hygiene is good. No erythema, swelling, or exudate. Tongue normal, non-obstructing. Tonsils not swollen or erythematous. Hearing normal.  Neck: Supple, thyroid not palpable. No bruits, nodes or JVD. Respiratory: Respiratory effort normal.  BS equal and clear bilateral without rales, rhonci, wheezing or stridor. Cardio: Heart sounds are normal with regular rate and rhythm and no murmurs, rubs or gallops. Peripheral pulses are normal and equal bilaterally without edema. No aortic or femoral bruits. Chest: symmetric with normal excursions and percussion. Breasts: Symmetric, without lumps, nipple discharge, retractions, or fibrocystic changes.  Abdomen: Flat, soft with bowel sounds active. Nontender, no guarding, rebound, hernias, masses, or organomegaly.  Lymphatics: Non tender without lymphadenopathy.  Genitourinary:  Musculoskeletal: Full ROM all peripheral extremities, joint stability, 5/5 strength, and normal gait. Skin: Warm and dry without rashes, lesions, cyanosis, clubbing or  ecchymosis.  Neuro: Cranial nerves intact, reflexes equal bilaterally. Normal  muscle tone, no cerebellar symptoms. Sensation intact.  Pysch: Alert and oriented X 3, normal affect, Insight and Judgment appropriate.   Assessment and Plan  1. Annual Preventative Screening Examination  2. Essential hypertension  - EKG 12-Lead - CBC with Differential/Platelet - COMPLETE METABOLIC PANEL WITH GFR - Magnesium - TSH  3. Hyperlipidemia associated with type 2 diabetes mellitus (HCC)  - EKG 12-Lead - Hemoglobin A1c - Insulin, random  4. Type 2 diabetes mellitus with stage 4 chronic kidney disease,  without long-term current use of insulin (HCC)  - EKG 12-Lead - HM DIABETES FOOT EXAM - LOW EXTREMITY NEUR EXAM DOCUM  5. Vitamin D deficiency  - VITAMIN D 25  Hydroxy  6. Screening for colorectal cancer  - POC Hemoccult Bld/Stl  7. Screening for ischemic heart disease  - EKG 12-Lead  8. FHx: heart disease  - EKG 12-Lead  9. Former smoker  - EKG 12-Lead  10. Senile purpura ([place])   11. CKD (chronic kidney disease) stage 4, GFR 15-29 ml/min (HCC)  - Urinalysis, Routine w reflex microscopic - COMPLETE METABOLIC PANEL WITH GFR  12. Medication management  - Urinalysis, Routine w reflex microscopic - CBC with Differential/Platelet - COMPLETE METABOLIC PANEL WITH GFR - Magnesium - TSH - Hemoglobin A1c - Insulin, random - VITAMIN D 25 Hydroxy        Patient was counseled in prudent diet to achieve/maintain BMI less than 25 for weight control, BP monitoring, regular exercise and medications. Discussed med's effects and SE's. Screening labs and tests as requested with regular follow-up as recommended. Over 40 minutes of exam, counseling, chart review and high complex critical decision making was performed.   Kirtland Bouchard, MD

## 2020-05-10 ENCOUNTER — Other Ambulatory Visit: Payer: Self-pay

## 2020-05-10 ENCOUNTER — Ambulatory Visit: Payer: Medicare Other | Admitting: Internal Medicine

## 2020-05-10 VITALS — BP 138/66 | HR 79 | Temp 97.2°F | Resp 16 | Ht <= 58 in | Wt 131.8 lb

## 2020-05-10 DIAGNOSIS — Z0001 Encounter for general adult medical examination with abnormal findings: Secondary | ICD-10-CM

## 2020-05-10 DIAGNOSIS — Z1211 Encounter for screening for malignant neoplasm of colon: Secondary | ICD-10-CM

## 2020-05-10 DIAGNOSIS — I10 Essential (primary) hypertension: Secondary | ICD-10-CM | POA: Diagnosis not present

## 2020-05-10 DIAGNOSIS — I447 Left bundle-branch block, unspecified: Secondary | ICD-10-CM

## 2020-05-10 DIAGNOSIS — Z8249 Family history of ischemic heart disease and other diseases of the circulatory system: Secondary | ICD-10-CM

## 2020-05-10 DIAGNOSIS — D692 Other nonthrombocytopenic purpura: Secondary | ICD-10-CM

## 2020-05-10 DIAGNOSIS — I2510 Atherosclerotic heart disease of native coronary artery without angina pectoris: Secondary | ICD-10-CM

## 2020-05-10 DIAGNOSIS — Z87891 Personal history of nicotine dependence: Secondary | ICD-10-CM

## 2020-05-10 DIAGNOSIS — Z0000 Encounter for general adult medical examination without abnormal findings: Secondary | ICD-10-CM

## 2020-05-10 DIAGNOSIS — Z136 Encounter for screening for cardiovascular disorders: Secondary | ICD-10-CM

## 2020-05-10 DIAGNOSIS — E1122 Type 2 diabetes mellitus with diabetic chronic kidney disease: Secondary | ICD-10-CM

## 2020-05-10 DIAGNOSIS — N184 Chronic kidney disease, stage 4 (severe): Secondary | ICD-10-CM

## 2020-05-10 DIAGNOSIS — Z79899 Other long term (current) drug therapy: Secondary | ICD-10-CM

## 2020-05-10 DIAGNOSIS — E559 Vitamin D deficiency, unspecified: Secondary | ICD-10-CM

## 2020-05-10 DIAGNOSIS — E1169 Type 2 diabetes mellitus with other specified complication: Secondary | ICD-10-CM

## 2020-05-11 NOTE — Progress Notes (Signed)
============================================================  -    Glucose 154 (Ideal is less than 130)  and   - A1c = 6.6% - also too high (Ideal is less than 5.7%)   - So...............    - Limit  Sweets, Candy & White Stuff   - Rice, Potatoes, Breads &  Pasta ============================================================  - Kidney functions still Stage 4 and Stable over the last 7 years  ============================================================  - Vitamin D = 72 - Excellent  ============================================================  - All Else - CBC - Electrolytes -  Liver - Magnesium & Thyroid  - all  Normal / OK ============================================================  - Keep up the [place] Work ! ============================================================

## 2020-05-14 LAB — COMPLETE METABOLIC PANEL WITH GFR
AG Ratio: 1.6 (calc) (ref 1.0–2.5)
ALT: 9 U/L (ref 6–29)
AST: 12 U/L (ref 10–35)
Albumin: 4.6 g/dL (ref 3.6–5.1)
Alkaline phosphatase (APISO): 73 U/L (ref 37–153)
BUN/Creatinine Ratio: 16 (calc) (ref 6–22)
BUN: 29 mg/dL — ABNORMAL HIGH (ref 7–25)
CO2: 24 mmol/L (ref 20–32)
Calcium: 9.9 mg/dL (ref 8.6–10.4)
Chloride: 107 mmol/L (ref 98–110)
Creat: 1.8 mg/dL — ABNORMAL HIGH (ref 0.60–0.88)
GFR, Est African American: 28 mL/min/{1.73_m2} — ABNORMAL LOW (ref >=60)
GFR, Est Non African American: 24 mL/min/{1.73_m2} — ABNORMAL LOW (ref >=60)
Globulin: 2.8 g/dL (calc) (ref 1.9–3.7)
Glucose, Bld: 154 mg/dL — ABNORMAL HIGH (ref 65–99)
Potassium: 4.4 mmol/L (ref 3.5–5.3)
Sodium: 140 mmol/L (ref 135–146)
Total Bilirubin: 0.4 mg/dL (ref 0.2–1.2)
Total Protein: 7.4 g/dL (ref 6.1–8.1)

## 2020-05-14 LAB — CBC WITH DIFFERENTIAL/PLATELET
Absolute Monocytes: 840 cells/uL (ref 200–950)
Basophils Absolute: 42 cells/uL (ref 0–200)
Basophils Relative: 0.5 %
Eosinophils Absolute: 193 cells/uL (ref 15–500)
Eosinophils Relative: 2.3 %
HCT: 37.1 % (ref 35.0–45.0)
Hemoglobin: 12.6 g/dL (ref 11.7–15.5)
Lymphs Abs: 3326 cells/uL (ref 850–3900)
MCH: 31.2 pg (ref 27.0–33.0)
MCHC: 34 g/dL (ref 32.0–36.0)
MCV: 91.8 fL (ref 80.0–100.0)
MPV: 12.3 fL (ref 7.5–12.5)
Monocytes Relative: 10 %
Neutro Abs: 3998 cells/uL (ref 1500–7800)
Neutrophils Relative %: 47.6 %
Platelets: 193 10*3/uL (ref 140–400)
RBC: 4.04 10*6/uL (ref 3.80–5.10)
RDW: 12.8 % (ref 11.0–15.0)
Total Lymphocyte: 39.6 %
WBC: 8.4 10*3/uL (ref 3.8–10.8)

## 2020-05-14 LAB — VITAMIN D 25 HYDROXY (VIT D DEFICIENCY, FRACTURES): Vit D, 25-Hydroxy: 72 ng/mL (ref 30–100)

## 2020-05-14 LAB — HEMOGLOBIN A1C
Hgb A1c MFr Bld: 6.6 % of total Hgb — ABNORMAL HIGH (ref <5.7)
Mean Plasma Glucose: 143 (calc)
eAG (mmol/L): 7.9 (calc)

## 2020-05-14 LAB — INSULIN, RANDOM: Insulin: 12.1 u[IU]/mL

## 2020-05-14 LAB — TSH: TSH: 3.1 mIU/L (ref 0.40–4.50)

## 2020-05-14 LAB — MAGNESIUM: Magnesium: 2.2 mg/dL (ref 1.5–2.5)

## 2020-07-02 ENCOUNTER — Other Ambulatory Visit: Payer: Self-pay | Admitting: Physician Assistant

## 2020-09-16 LAB — HM DIABETES EYE EXAM

## 2020-09-25 ENCOUNTER — Encounter: Payer: Self-pay | Admitting: *Deleted

## 2020-10-01 ENCOUNTER — Other Ambulatory Visit: Payer: Self-pay | Admitting: Internal Medicine

## 2020-10-04 ENCOUNTER — Other Ambulatory Visit: Payer: Self-pay | Admitting: Internal Medicine

## 2020-11-11 ENCOUNTER — Ambulatory Visit: Payer: Medicare Other | Admitting: Adult Health

## 2020-12-09 NOTE — Progress Notes (Signed)
MEDICARE ANNUAL WELLNESS VISIT AND FOLLOW UP  Assessment:    Medicare annual wellness visit, subsequent -due next year. - declines mammogram, DEXA, would not pursue treatment   Essential hypertension - CONTROLLED OFF ACE, doing well on norvasc and atenolol- continue the same -DASH diet -cont exercise as tolerated - TSH   Hyperlipidemia -cont diet and exercise -hx of statin intolerance; no significant hx -would not aggressively pursue due to age, no longer checking  Vitamin D deficiency -cont Vit D supplement  Medication management - CBC with Differential/Platelet - CMP/GFR - Magnesium    Primary osteoarthritis of both hips -cont movement and exercise as tolerated  Seniles purpura Discussed process, protect skin, sunscreen  Type 2 diabetes mellitus with stage 4 chronic kidney disease, without long-term current use of insulin (HCC) -     Hemoglobin A1c -cont diet and exercise -UTD with eye exams - Hemoglobin A1c  CKD stage 4 due to type 2 diabetes mellitus (HCC) Increase fluids, avoid NSAIDS, monitor sugars, will monitor here unless trending down -     COMPLETE METABOLIC PANEL WITH GFR - - OFF ACE DUE TO POTASSIUM  ASHD (arteriosclerotic heart disease) Control blood pressure, glucose, increase exercise.  No chest pain; no longer on cholesterol med due to age, shared decision making  Moderate risk of falls Ambulates with walker; encouraged she participate in balance classes provided at [place]; fall prevention discussed  Sedentary lifestyle Encouraged participation in exercise programs at [facility] Given chair exercises she can do daily at home    Over 30 minutes of exam, counseling, chart review, and critical decision making was performed Future Appointments  Date Time Provider [place]  05/15/2021 10:00 AM Unk Pinto, MD [facility] None  12/11/2021 11:30 AM Liane Comber, NP [facility] None     Plan:   During the course of the visit  the patient was educated and counseled about appropriate screening and preventive services including:    Pneumococcal vaccine   Influenza vaccine  Td vaccine  Prevnar 13  Screening electrocardiogram  Screening mammography  Bone densitometry screening  Colorectal cancer screening  Diabetes screening  Glaucoma screening  Nutrition counseling   Advanced directives: given info/requested copies  Subjective:   Jeanette Espinoza is a 87 y.o. female who presents for Medicare Annual Wellness Visit and 3 month follow up on hypertension, lifestyle controlled diabetes, CKD 4, hyperlipidemia, vitamin D def.   She is at [facility], uses her walker religiously, no falls in several years. She has in home help 2 hours 3 days a week. Daughter brought her today, she is no longer driving, [facility] and family drives. She is hard of hearing, no hearing aids. Did have hearing checked in 2019 with 40% decline, has noted some decline since and plans to follow up with Dr. Verlan Friends audiologist.   BMI is Body mass index is 27.38 kg/m., she has not been working on diet and exercise. Ambulates with walker down long hall multiple time a day, does get winded and takes regular breaks.  Wt Readings from Last 3 Encounters:  12/11/20 131 lb (59.4 kg)  05/10/20 131 lb 12.8 oz (59.8 kg)  01/25/20 128 lb (58.1 kg)   Her blood pressure has been controlled at home, she is off ACE due to kidney function and on norvasc now and atenolol 25 in the PM, today their BP is BP: 138/72 She does not workout. She denies chest pain, dizziness. Does get winded walking long distances, admits sedentary lifestyle for many years.  She is not on cholesterol medication. Her cholesterol is not at goal. The cholesterol last visit was:     She is off chol due to age and myalgias. No longer checking.  Lab Results  Component Value Date   CHOL 295 (H) 07/13/2019   HDL 49 (L) 07/13/2019   LDLCALC 200 (H) 07/13/2019    TRIG 260 (H) 07/13/2019   CHOLHDL 6.0 (H) 07/13/2019   She does have diabetes, she has had an A1C as high as 7.1 in the past but she is controlling it with diet and exercise. She notes that she is asymptomatic.  Lab Results  Component Value Date   HGBA1C 6.6 (H) 05/10/2020   She has CKD stage 4, she was taken off her enalapril due to elevated potassium, she is on amlodipine and atenolol now. We are monitoring here in office.  Last GFR Lab Results  Component Value Date   GFRNONAA 24 (L) 05/10/2020   GFRNONAA 23 (L) 01/25/2020   GFRNONAA 24 (L) 10/16/2019   Patient is on Vitamin D supplement. Lab Results  Component Value Date   VD25OH 72 05/10/2020      Medication Review Current Outpatient Medications on File Prior to Visit  Medication Sig Dispense Refill  . amLODipine (NORVASC) 5 MG tablet Take 1 tablet      Daily       for BP 90 tablet 0  . Cholecalciferol (VITAMIN D) 2000 UNITS CAPS Take 6,000 Units by mouth daily.    . Magnesium 250 MG TABS Take by mouth 2 (two) times daily.    Marland Kitchen triamcinolone ointment (KENALOG) 0.1 % APPLY 1 APPLICATION TOPICALLY 2 TIMES DAILY 80 g 0   No current facility-administered medications on file prior to visit.    Current Problems (verified) Patient Active Problem List   Diagnosis Date Noted  . Type 2 diabetes mellitus with stage 4 chronic kidney disease, without long-term current use of insulin ([place]) 01/23/2020  . Hyperlipidemia associated with type 2 diabetes mellitus ([place]) 01/23/2020  . Pain due to onychomycosis of toenails of both feet 06/09/2019  . Senile purpura ([place]) 09/16/2018  . ASHD (arteriosclerotic heart disease) 09/14/2018  . CKD stage 4 due to type 2 diabetes mellitus ([place]) 11/03/2017  . Essential hypertension 01/02/2016  . Medication management 02/06/2014  . Diet-controlled diabetes mellitus ([place]) 02/06/2014  . Vitamin D deficiency   . OA (osteoarthritis) of hip 12/02/2012    Screening Tests Immunization History   Administered Date(s) Administered  . Influenza Split 10/11/2013  . Influenza, High Dose Seasonal PF 09/18/2014, 09/20/2015, 08/04/2017, 09/16/2018, 10/16/2019  . Influenza, Seasonal, Injecte, Preservative Fre 10/15/2016  . Influenza-Unspecified 10/10/2012, 08/09/2020  . Moderna Sars-Covid-2 Vaccination 11/27/2019, 12/28/2019  . Pneumococcal Conjugate-13 12/19/2014  . Pneumococcal Polysaccharide-23 10/11/2013  . Tdap 09/21/2012    Preventative care: Last colonoscopy: 2008 DONE due to age Last mammogram: 2017- declined further  DEXA: declines  Prior vaccinations: TD or Tdap: 2013  Influenza: 2021 at [facility]  Pneumococcal: 2014 Prevnar13: 2016 Shingles/Zostavax: Declined Covid 19: 3/3, 2021, moderna - booster date requested  Names of Other Physician/Practitioners you currently use: 1. [facility]- here for primary care 2. Dr. Sabra Heck, eye doctor, last visit 09/16/2020 3. Dr. Marland Kitchen New provider - ? Dr. Melony Overly, dentist, has scheduled 01/2021  Patient Care Team: Unk Pinto, MD as PCP - General (Internal Medicine) Gaynelle Arabian, MD as Consulting Physician (Orthopedic Surgery) Harrie Foreman, [place] as Referring Physician (Optometry) Teena Irani, MD (Inactive) as Consulting Physician (Gastroenterology)  Suella Broad, MD as Consulting Physician (Physical Medicine and Rehabilitation) Almyra Deforest, [place] as Consulting Physician (Cardiology)  Allergies No Known Allergies  SURGICAL HISTORY She  has a past surgical history that includes lexiscan myoview (11/11); surgery for DDD; cataract surgery; abnormal EKG; Back surgery; Total hip arthroplasty (12/02/2012); Eye surgery (Bilateral); Spine surgery; Abdominal hysterectomy; Joint replacement; and colonoscopsy (2008). FAMILY HISTORY Her family history includes Diabetes in an other family member; Heart disease in her mother. SOCIAL HISTORY She  reports that she quit smoking about 50 years ago. She has never  used smokeless tobacco. She reports current alcohol use. She reports that she does not use drugs.   MEDICARE WELLNESS OBJECTIVES: Physical activity: Current Exercise Habits: The patient does not participate in regular exercise at present, Exercise limited by: orthopedic condition(s);cardiac condition(s) Cardiac risk factors: Cardiac Risk Factors include: advanced age (>11mn, >>18women);dyslipidemia;hypertension;sedentary lifestyle;diabetes mellitus Depression/mood screen:   Depression screen [facility] 12/11/2020  Decreased Interest 0  Down, Depressed, Hopeless 0  PHQ - 2 Score 0    ADLs:  In your present state of health, do you have any difficulty performing the following activities: 12/11/2020 05/09/2020  Hearing? Y Y  Comment - hearing aids  Vision? N N  Difficulty concentrating or making decisions? N N  Walking or climbing stairs? Y N  Comment uses walker, doesn't do stairs anymore -  Dressing or bathing? Y N  Comment [facility] assists with bathing -  Doing errands, shopping? N N  Comment she is driven by family and [place] eating ? N -  Using the Toilet? N -  In the past six months, have you accidently leaked urine? N -  Do you have problems with loss of bowel control? N -  Managing your Medications? N -  Managing your Finances? N -  Housekeeping or managing your Housekeeping? N -  Some recent data might be hidden     Cognitive Testing  Alert? Yes  Normal Appearance?Yes  Oriented to person? Yes  Place? Yes   Time? Yes  Recall of three objects?  Yes  Can perform simple calculations? Yes  Displays appropriate judgment?Yes  Can read the correct time from a watch face?Yes  EOL planning: Does Patient Have a Medical Advance Directive?: Yes Type of Advance Directive: [place] Does patient want to make changes to medical advance directive?: No - Patient declined Copy of [place] Chart?: No - copy  requested   Objective:   Today's Vitals   12/11/20 1137  BP: 138/72  Pulse: 69  Temp: (!) 97.5 F (36.4 C)  SpO2: 99%  Weight: 131 lb (59.4 kg)   Body mass index is 27.38 kg/m.  General appearance: alert, no distress, WD/WN,  female HEENT: normocephalic, sclerae anicteric, TMs pearly, nares patent, no discharge or erythema, pharynx normal. HOH without hearing aids Oral cavity: MMM, no lesions Neck: supple, no lymphadenopathy, no thyromegaly, no masses Heart: RRR, normal S1, S2, no murmurs Lungs: CTA bilaterally, no wheezes, rhonchi, or rales Abdomen: +bs, soft, non tender, non distended, no masses, no hepatomegaly, no splenomegaly Musculoskeletal: nontender, no swelling, no obvious deformity, slow non-antalgic gait with walker Extremities: no edema, no cyanosis, no clubbing. She does have scattered ecchymoses Pulses: 2+ symmetric, upper and lower extremities, normal cap refill Neurological: alert, oriented x 3, CN2-12 intact, strength normal upper extremities and lower extremities, sensation normal throughout, DTRs 2+ throughout, no cerebellar signs, gait normal with walker Psychiatric: normal  affect, behavior normal, pleasant    Medicare Attestation I have personally reviewed: The patient's medical and social history Their use of alcohol, tobacco or illicit drugs Their current medications and supplements The patient's functional ability including ADLs,fall risks, home safety risks, cognitive, and hearing and visual impairment Diet and physical activities Evidence for depression or mood disorders  The patient's weight, height, BMI, and visual acuity have been recorded in the chart.  I have made referrals, counseling, and provided education to the patient based on review of the above and I have provided the patient with a written personalized care plan for preventive services.     Jeanette Ribas, NP   12/11/2020

## 2020-12-11 ENCOUNTER — Encounter: Payer: Self-pay | Admitting: Adult Health

## 2020-12-11 ENCOUNTER — Other Ambulatory Visit: Payer: Self-pay | Admitting: Adult Health

## 2020-12-11 ENCOUNTER — Other Ambulatory Visit: Payer: Self-pay

## 2020-12-11 ENCOUNTER — Ambulatory Visit: Payer: Medicare Other | Admitting: Adult Health

## 2020-12-11 VITALS — BP 138/72 | HR 69 | Temp 97.5°F | Wt 131.0 lb

## 2020-12-11 DIAGNOSIS — Z0000 Encounter for general adult medical examination without abnormal findings: Secondary | ICD-10-CM

## 2020-12-11 DIAGNOSIS — Z0001 Encounter for general adult medical examination with abnormal findings: Secondary | ICD-10-CM | POA: Diagnosis not present

## 2020-12-11 DIAGNOSIS — I10 Essential (primary) hypertension: Secondary | ICD-10-CM

## 2020-12-11 DIAGNOSIS — E119 Type 2 diabetes mellitus without complications: Secondary | ICD-10-CM | POA: Diagnosis not present

## 2020-12-11 DIAGNOSIS — R6889 Other general symptoms and signs: Secondary | ICD-10-CM

## 2020-12-11 DIAGNOSIS — E559 Vitamin D deficiency, unspecified: Secondary | ICD-10-CM

## 2020-12-11 DIAGNOSIS — D692 Other nonthrombocytopenic purpura: Secondary | ICD-10-CM

## 2020-12-11 DIAGNOSIS — I2510 Atherosclerotic heart disease of native coronary artery without angina pectoris: Secondary | ICD-10-CM | POA: Diagnosis not present

## 2020-12-11 DIAGNOSIS — E1122 Type 2 diabetes mellitus with diabetic chronic kidney disease: Secondary | ICD-10-CM

## 2020-12-11 DIAGNOSIS — E785 Hyperlipidemia, unspecified: Secondary | ICD-10-CM

## 2020-12-11 DIAGNOSIS — Z9181 History of falling: Secondary | ICD-10-CM | POA: Insufficient documentation

## 2020-12-11 DIAGNOSIS — M160 Bilateral primary osteoarthritis of hip: Secondary | ICD-10-CM

## 2020-12-11 DIAGNOSIS — E1169 Type 2 diabetes mellitus with other specified complication: Secondary | ICD-10-CM

## 2020-12-11 DIAGNOSIS — Z79899 Other long term (current) drug therapy: Secondary | ICD-10-CM

## 2020-12-11 DIAGNOSIS — N184 Chronic kidney disease, stage 4 (severe): Secondary | ICD-10-CM

## 2020-12-11 DIAGNOSIS — Z9189 Other specified personal risk factors, not elsewhere classified: Secondary | ICD-10-CM | POA: Insufficient documentation

## 2020-12-11 MED ORDER — ATENOLOL 25 MG PO TABS: 25.0000 mg | ORAL_TABLET | Freq: Every day | ORAL | 1 refills | Status: DC

## 2020-12-11 NOTE — Patient Instructions (Signed)
Jeanette Espinoza , Thank you for taking time to come for your Medicare Wellness Visit. I appreciate your ongoing commitment to your health goals. Please review the following plan we discussed and let me know if I can assist you in the future.   These are the goals we discussed: Goals    . Exercise 3x per week (20-30 min per time)       This is a list of the screening recommended for you and due dates:  Health Maintenance  Topic Date Due  . Urine Protein Check  04/11/2020  . COVID-19 Vaccine (3 - Booster for Moderna series) 06/26/2020  . Hemoglobin A1C  11/10/2020  . Complete foot exam   05/09/2021  . Eye exam for diabetics  09/16/2021  . Tetanus Vaccine  09/21/2022  . Flu Shot  Completed  . Pneumonia vaccines  Completed  . DEXA scan (bone density measurement)  Discontinued     Exercises to do While Sitting  Exercises that you do while sitting (chair exercises) can give you many of the same benefits as full exercise. Benefits include strengthening your heart, burning calories, and keeping muscles and joints healthy. Exercise can also improve your mood and help with depression and anxiety. You may benefit from chair exercises if you are unable to do standing exercises because of:  Diabetic foot pain.  Obesity.  Illness.  Arthritis.  Recovery from surgery or injury.  Breathing problems.  Balance problems.  Another type of disability. Before starting chair exercises, check with your health care provider or a physical therapist to find out how much exercise you can tolerate and which exercises are safe for you. If your health care provider approves:  Start out slowly and build up over time. Aim to work up to about 10-20 minutes for each exercise session.  Make exercise part of your daily routine.  Drink water when you exercise. Do not wait until you are thirsty. Drink every 10-15 minutes.  Stop exercising right away if you have pain, nausea, shortness of breath, or  dizziness.  If you are exercising in a wheelchair, make sure to lock the wheels.  Ask your health care provider whether you can do tai chi or yoga. Many positions in these mind-body exercises can be modified to do while seated. Warm-up Before starting other exercises: 1. Sit up as straight as you can. Have your knees bent at 90 degrees, which is the shape of the capital letter "L." Keep your feet flat on the floor. 2. Sit at the front edge of your chair, if you can. 3. Pull in (tighten) the muscles in your abdomen and stretch your spine and neck as straight as you can. Hold this position for a few minutes. 4. Breathe in and out evenly. Try to concentrate on your breathing, and relax your mind. Stretching Exercise A: Arm stretch 1. Hold your arms out straight in front of your body. 2. Bend your hands at the wrist with your fingers pointing up, as if signaling someone to stop. Notice the slight tension in your forearms as you hold the position. 3. Keeping your arms out and your hands bent, rotate your hands outward as far as you can and hold this stretch. Aim to have your thumbs pointing up and your pinkie fingers pointing down. Slowly repeat arm stretches for one minute as tolerated. Exercise B: Leg stretch 1. If you can move your legs, try to "draw" letters on the floor with the toes of your foot. Write your  name with one foot. 2. Write your name with the toes of your other foot. Slowly repeat the movements for one minute as tolerated. Exercise C: Reach for the sky 1. Reach your hands as far over your head as you can to stretch your spine. 2. Move your hands and arms as if you are climbing a rope. Slowly repeat the movements for one minute as tolerated. Range of motion exercises Exercise A: Shoulder roll 1. Let your arms hang loosely at your sides. 2. Lift just your shoulders up toward your ears, then let them relax back down. 3. When your shoulders feel loose, rotate your shoulders in  backward and forward circles. Do shoulder rolls slowly for one minute as tolerated. Exercise B: March in place 1. As if you are marching, pump your arms and lift your legs up and down. Lift your knees as high as you can. ? If you are unable to lift your knees, just pump your arms and move your ankles and feet up and down. March in place for one minute as tolerated. Exercise C: Seated jumping jacks 1. Let your arms hang down straight. 2. Keeping your arms straight, lift them up over your head. Aim to point your fingers to the ceiling. 3. While you lift your arms, straighten your legs and slide your heels along the floor to your sides, as wide as you can. 4. As you bring your arms back down to your sides, slide your legs back together. ? If you are unable to use your legs, just move your arms. Slowly repeat seated jumping jacks for one minute as tolerated. Strengthening exercises Exercise A: Shoulder squeeze 1. Hold your arms straight out from your body to your sides, with your elbows bent and your fists pointed at the ceiling. 2. Keeping your arms in the bent position, move them forward so your elbows and forearms meet in front of your face. 3. Open your arms back out as wide as you can with your elbows still bent, until you feel your shoulder blades squeezing together. Hold for 5 seconds. Slowly repeat the movements forward and backward for one minute as tolerated. Contact a health care provider if you:  Had to stop exercising due to any of the following: ? Pain. ? Nausea. ? Shortness of breath. ? Dizziness. ? Fatigue.  Have significant pain or soreness after exercising. Get help right away if you have:  Chest pain.  Difficulty breathing. These symptoms may represent a serious problem that is an emergency. Do not wait to see if the symptoms will go away. Get medical help right away. Call your local emergency services (911 in the [place] Do not drive yourself to the hospital. This  information is not intended to replace advice given to you by your health care provider. Make sure you discuss any questions you have with your health care provider. Document Revised: 02/22/2020 Document Reviewed: 02/22/2020 Elsevier Patient Education  2021 [employer].

## 2020-12-12 LAB — TSH: TSH: 2.51 mIU/L (ref 0.40–4.50)

## 2020-12-12 LAB — CBC WITH DIFFERENTIAL/PLATELET
Absolute Monocytes: 1009 cells/uL — ABNORMAL HIGH (ref 200–950)
Basophils Absolute: 31 cells/uL (ref 0–200)
Basophils Relative: 0.3 %
Eosinophils Absolute: 309 cells/uL (ref 15–500)
Eosinophils Relative: 3 %
HCT: 37.1 % (ref 35.0–45.0)
Hemoglobin: 12.5 g/dL (ref 11.7–15.5)
Lymphs Abs: 3245 cells/uL (ref 850–3900)
MCH: 30.9 pg (ref 27.0–33.0)
MCHC: 33.7 g/dL (ref 32.0–36.0)
MCV: 91.8 fL (ref 80.0–100.0)
MPV: 11.8 fL (ref 7.5–12.5)
Monocytes Relative: 9.8 %
Neutro Abs: 5706 cells/uL (ref 1500–7800)
Neutrophils Relative %: 55.4 %
Platelets: 216 10*3/uL (ref 140–400)
RBC: 4.04 10*6/uL (ref 3.80–5.10)
RDW: 12.4 % (ref 11.0–15.0)
Total Lymphocyte: 31.5 %
WBC: 10.3 10*3/uL (ref 3.8–10.8)

## 2020-12-12 LAB — COMPLETE METABOLIC PANEL WITH GFR
AG Ratio: 1.6 (calc) (ref 1.0–2.5)
ALT: 11 U/L (ref 6–29)
AST: 15 U/L (ref 10–35)
Albumin: 4.6 g/dL (ref 3.6–5.1)
Alkaline phosphatase (APISO): 87 U/L (ref 37–153)
BUN/Creatinine Ratio: 18 (calc) (ref 6–22)
BUN: 34 mg/dL — ABNORMAL HIGH (ref 7–25)
CO2: 25 mmol/L (ref 20–32)
Calcium: 10.1 mg/dL (ref 8.6–10.4)
Chloride: 103 mmol/L (ref 98–110)
Creat: 1.94 mg/dL — ABNORMAL HIGH (ref 0.60–0.88)
GFR, Est African American: 25 mL/min/{1.73_m2} — ABNORMAL LOW (ref >=60)
GFR, Est Non African American: 22 mL/min/{1.73_m2} — ABNORMAL LOW (ref >=60)
Globulin: 2.8 g/dL (calc) (ref 1.9–3.7)
Glucose, Bld: 130 mg/dL — ABNORMAL HIGH (ref 65–99)
Potassium: 4.5 mmol/L (ref 3.5–5.3)
Sodium: 139 mmol/L (ref 135–146)
Total Bilirubin: 0.4 mg/dL (ref 0.2–1.2)
Total Protein: 7.4 g/dL (ref 6.1–8.1)

## 2020-12-12 LAB — HEMOGLOBIN A1C
Hgb A1c MFr Bld: 6.9 % of total Hgb — ABNORMAL HIGH (ref <5.7)
Mean Plasma Glucose: 151 mg/dL
eAG (mmol/L): 8.4 mmol/L

## 2020-12-12 LAB — MAGNESIUM: Magnesium: 2.4 mg/dL (ref 1.5–2.5)

## 2020-12-23 ENCOUNTER — Ambulatory Visit: Payer: Medicare Other | Admitting: Adult Health

## 2020-12-23 ENCOUNTER — Other Ambulatory Visit: Payer: Self-pay

## 2020-12-23 ENCOUNTER — Encounter: Payer: Self-pay | Admitting: Adult Health

## 2020-12-23 VITALS — BP 124/72 | HR 76 | Temp 97.3°F

## 2020-12-23 DIAGNOSIS — I10 Essential (primary) hypertension: Secondary | ICD-10-CM

## 2020-12-23 DIAGNOSIS — R208 Other disturbances of skin sensation: Secondary | ICD-10-CM | POA: Diagnosis not present

## 2020-12-23 DIAGNOSIS — M76891 Other specified enthesopathies of right lower limb, excluding foot: Secondary | ICD-10-CM

## 2020-12-23 DIAGNOSIS — Z8739 Personal history of other diseases of the musculoskeletal system and connective tissue: Secondary | ICD-10-CM

## 2020-12-23 DIAGNOSIS — M7061 Trochanteric bursitis, right hip: Secondary | ICD-10-CM

## 2020-12-23 MED ORDER — PREDNISONE 20 MG PO TABS: ORAL_TABLET | ORAL | 0 refills | Status: DC

## 2020-12-23 MED ORDER — ATENOLOL 25 MG PO TABS: 25.0000 mg | ORAL_TABLET | Freq: Every day | ORAL | 1 refills | Status: DC

## 2020-12-23 NOTE — Patient Instructions (Signed)
Can try topical voltaren, aspercreme - 3-4 times a day   Try ice or biofreeze on lateral hip  Ok to continue with tylenol if needed    Hip Bursitis  Hip bursitis is swelling of one or more fluid-filled sacs (bursae) in your hip joint. This condition can cause pain, and your symptoms may come and go over time. What are the causes?  Repeated use of your hip muscles.  Injury to the hip.  Weak butt muscles.  Bone spurs.  Infection. In some cases, the cause may not be known. What increases the risk? You are more likely to develop this condition if:  You had a past hip injury or hip surgery.  You have a condition, such as arthritis, gout, diabetes, or thyroid disease.  You have spine problems.  You have one leg that is shorter than the other.  You run a lot or do long-distance running.  You play sports where there is a risk of injury or falling, such as football, martial arts, or skiing. What are the signs or symptoms? Symptoms may come and go, and they often include:  Pain in the hip or groin area. Pain may get worse when you move your hip.  Tenderness and swelling of the hip. In rare cases, the bursa may become infected. If this happens, you may get a fever, as well as have warmth and redness in the hip area. How is this treated? This condition is treated by:  Resting your hip.  Icing your hip.  Wrapping the hip area with an elastic bandage (compression wrap).  Keeping the hip raised. Other treatments may include medicine, draining fluid out of the bursa, or using crutches, a cane, or a walker. Surgery may be needed, but this is rare. Long-term treatment may include doing exercises to help your strength and flexibility. It may also include lifestyle changes like losing weight to lessen the strain on your hip. Follow these instructions at home: Managing pain, stiffness, and swelling  If told, put ice on the painful area. ? Put ice in a plastic  bag. ? Place a towel between your skin and the bag. ? Leave the ice on for 20 minutes, 2-3 times a day.  Raise your hip by putting a pillow under your hips while you lie down. Stop if you feel pain.  If told, put heat on the affected area. Do this as often as told by your doctor. Use a moist heat pack or a heating pad as told by your doctor. ? Place a towel between your skin and the heat source. ? Leave the heat on for 20-30 minutes. ? Take off the heat if your skin turns bright red. This is very important if you are unable to feel pain, heat, or cold. You may have a greater risk of getting burned.      Activity  Do not use your hip to support your body weight until your doctor says that you can.  Use crutches, a cane, or a walker as told by your doctor.  If the affected leg is one that you use to drive, ask your doctor if it is safe to drive.  Rest and protect your hip as much as you can until you feel better.  Return to your normal activities as told by your doctor. Ask your doctor what activities are safe for you.  Do exercises as told by your doctor. General instructions  Take over-the-counter and prescription medicines only as told  by your doctor.  Gently rub and stretch your injured area as often as is comfortable.  Wear elastic bandages only as told by your doctor.  If one of your legs is shorter than the other, get fitted for a shoe insert or orthotic.  Keep a healthy weight. Follow instructions from your doctor.  Keep all follow-up visits as told by your doctor. This is important. How is this prevented?  Exercise regularly, as told by your doctor.  Wear the right shoes for the sport you play.  Warm up and stretch before being active. Cool down and stretch after being active.  Take breaks often from repeated activity.  Avoid activities that bother your hip or cause pain.  Avoid sitting down for a long time. Where to find more information  American Academy  of Orthopaedic Surgeons: orthoinfo.aaos.org Contact a doctor if:  You have a fever.  You have new symptoms.  You have trouble walking or doing everyday activities.  You have pain that gets worse or does not get better with medicine.  Your skin around your hip is red.  You get a feeling of warmth in your hip area. Get help right away if:  You cannot move your hip.  You have very bad pain.  You cannot control the muscles in your feet. Summary  Hip bursitis is swelling of one or more fluid-filled sacs (bursae) in your hip joint.  Symptoms often come and go over time.  This condition is often treated by resting and icing the hip. It also may help to keep the area raised and wrapped in an elastic bandage. Other treatments may be needed. This information is not intended to replace advice given to you by your health care provider. Make sure you discuss any questions you have with your health care provider. Document Revised: 08/28/2019 Document Reviewed: 07/04/2018 Elsevier Patient Education  2021 [employer].

## 2020-12-23 NOTE — Progress Notes (Signed)
Assessment and Plan:  Jeanette Espinoza was seen today for leg pain.  Diagnoses and all orders for this visit:  Right hip tendonitis Trochanteric bursitis of right hip Prednisone was prescribed,NSAIDs contraindicated, RICE, and exercise given Can continue tylenol PRN; also discussed can try topical voltaren or aspercreme 3-4 times daily PRN If not better in 1-2 weeks follow up in office or will refer to PT/orthopedics. Neurosurgeon distributed. Stretching exercises discussed. Ice to affected area as needed for local pain relief. -     predniSONE (DELTASONE) 20 MG tablet; 2 tablets daily for 3 days, 1 tablet daily for 4 days.  Essential hypertension -     atenolol (TENORMIN) 25 MG tablet; Take 1 tablet (25 mg total) by mouth daily.  Decreased sensation of lower extremity History of spinal stenosis With aching of RLE; however with tenderness - see bursitis plan  Decreased sensation reported, inconsistent results on exam with unclear distribution, but does appear decreased L < R on exam with monofilament; reports hx of chronic lumbar/spinal stenosis, could be chronic baseline for her, she is unsure Will try steroid taper per above; upper extremity exam and other neuro is intact without red flags Monitor progress at home; contact office if any worse or not improving, will plan lumbar MRI vs referral to specialist for EMG/NCV  Further disposition pending results of labs. Discussed med's effects and SE's.   Over 15 minutes of exam, counseling, chart review, and critical decision making was performed.   Future Appointments  Date Time Provider [place]  05/15/2021 10:00 AM Unk Pinto, MD [facility] None  12/11/2021 11:30 AM Liane Comber, NP [facility] None    ------------------------------------------------------------------------------------------------------------------   HPI BP 124/72   Pulse 76   Temp (!) 97.3 F (36.3 C)   SpO2 98%   87 y.o.female presents for  evaluation of R leg pain x 5 days. She has fairly controlled T2DM with CKD 4, very sedentary and ambulates with walker. Accompanied by her daughter today.   She reports pain began suddenly, 5 days ago, no notable events, hasn't fallen or notably injury recently. She reports sensation of constant anterolateral R leg aching, tender laterally through her hip. Denies shooting/radicular pain.   She reports has taken 500 mg tylenol for pain, enough to help her sleep; reports currently 7/10.  She reports pain is the least in the morning or after resting, worse as the day goes on. "just aches all the time."   She reports hx of spinal stenosis remotely, had injections, sx improved and hasn't caused any problems for many years. Also reports decreased sensation in lower leg and foot, denies weakness. Denies saddle anesthesia, lumbar pain, loss of bladder or bowel control.     Past Medical History:  Diagnosis Date  . Arthritis   . HLD (hyperlipidemia)   . HTN (hypertension)    echo (11/11) with EF 55-60%, mild LV hypertrophy, PA systolic pressure 34 mmHg  . Low back pain   . Pre-diabetes    diet controlled  . Unspecified vitamin D deficiency      No Known Allergies  Current Outpatient Medications on File Prior to Visit  Medication Sig  . amLODipine (NORVASC) 5 MG tablet Take 1 tablet      Daily       for BP  . atenolol (TENORMIN) 25 MG tablet Take 1 tablet (25 mg total) by mouth daily.  . Cholecalciferol (VITAMIN D) 2000 UNITS CAPS Take 6,000 Units by mouth daily.  . Magnesium 250 MG TABS  Take by mouth 2 (two) times daily.  Marland Kitchen triamcinolone ointment (KENALOG) 0.1 % APPLY 1 APPLICATION TOPICALLY 2 TIMES DAILY   No current facility-administered medications on file prior to visit.    ROS: all negative except above.   Physical Exam:  BP 124/72   Pulse 76   Temp (!) 97.3 F (36.3 C)   SpO2 98%   General Appearance: Well nourished, well dressed elder female, in no apparent distress. Eyes:  PERRLA, EOMs, conjunctiva no swelling or erythema ENT/Mouth: Ext aud canals clear, TMs without erythema, bulging. No erythema, swelling, or exudate on post pharynx.  Tonsils not swollen or erythematous. HOH with bil hearing aids.  Neck: Supple Respiratory: Respiratory effort normal, BS equal bilaterally without rales, rhonchi, wheezing or stridor.  Cardio: RRR with no MRGs. Intact peripheral pulses without edema.  Abdomen: Soft, + BS.  Non tender. Lymphatics: Non tender without lymphadenopathy.  Musculoskeletal: Limited exam, declines to get on exam table. No spinous tenderness; she has tenderness R hip trochanter; R hip musculature, along lateral leg and knee extending to calf. No obvious deformity of bil knees, hips. She has diminished sensation to lower legs bil to monofilament; however L >R, strength symmetrical and intact bil lower extremities except pain with hip abduction. Patellar and achilles reflexes diminished bilaterally. Ambulates slowly with walker. Good sitting to standing strength.  Skin: Warm, dry without rashes, lesions, ecchymosis.  Neuro: Cranial nerves intact. Normal muscle tone, no cerebellar symptoms.  Psych: Awake and oriented X 3, normal affect, Insight and Judgment fair.     Izora Ribas, NP 2:51 PM [facility]

## 2021-01-06 ENCOUNTER — Other Ambulatory Visit: Payer: Self-pay | Admitting: Internal Medicine

## 2021-01-20 ENCOUNTER — Other Ambulatory Visit: Payer: Self-pay | Admitting: Adult Health

## 2021-01-20 ENCOUNTER — Telehealth: Payer: Self-pay

## 2021-01-20 DIAGNOSIS — M25559 Pain in unspecified hip: Secondary | ICD-10-CM

## 2021-01-20 DIAGNOSIS — M545 Low back pain, unspecified: Secondary | ICD-10-CM

## 2021-01-20 DIAGNOSIS — Z8739 Personal history of other diseases of the musculoskeletal system and connective tissue: Secondary | ICD-10-CM

## 2021-01-20 NOTE — Telephone Encounter (Signed)
On Prednisone but shoulder and leg are numb. Wanting to speak directly to [employer] because she doesn't feel she is getting better.

## 2021-02-04 ENCOUNTER — Ambulatory Visit: Payer: Medicare Other | Admitting: Adult Health Nurse Practitioner

## 2021-03-06 ENCOUNTER — Telehealth: Payer: Self-pay | Admitting: *Deleted

## 2021-03-06 NOTE — Telephone Encounter (Signed)
Returned fax regarding order for knee high compression hose 15-20 mmHg compression to manage edema in BLE.

## 2021-05-14 ENCOUNTER — Encounter: Payer: Self-pay | Admitting: Internal Medicine

## 2021-05-14 NOTE — Patient Instructions (Signed)

## 2021-05-14 NOTE — Progress Notes (Signed)
Annual Screening/Preventative Visit & Comprehensive Evaluation &  Examination  Future Appointments  Date Time Provider [place]  05/15/2021 10:00 AM Unk Pinto, MD [facility] None  12/11/2021 11:30 AM Liane Comber, NP [facility] None  05/15/2022 10:00 AM Unk Pinto, MD [facility] None        This very nice 87 y.o. WWF presents for a Screening /Preventative Visit & comprehensive evaluation and management of multiple medical co-morbidities.  Patient has been followed for HTN, HLD, T2_NIDDM  and Vitamin D Deficiency.        HTN predates circa 2007 & patient has CKD4 (GFR 22).   Patient's BP has been controlled at home and patient denies any cardiac symptoms as chest pain, palpitations, shortness of breath, dizziness or ankle swelling. Today's BP is at goal - 125/66 .       Patient's hyperlipidemia is not controlled with diet and patient is Statin Intolerant.  Patient  Last lipids were not at goal and aggressive treatment deferred due to age:  Lab Results  Component Value Date   CHOL 295 (H) 07/13/2019   HDL 49 (L) 07/13/2019   LDLCALC 200 (H) 07/13/2019   TRIG 260 (H) 07/13/2019   CHOLHDL 6.0 (H) 07/13/2019        Patient has hx/o T2_NIDDM (A1c 6.5% /2016) w/CKD4  (GFR 22) managed with diet and patient denies reactive hypoglycemic symptoms, visual blurring, diabetic polys or paresthesias. Last A1c was not at goal:  Lab Results  Component Value Date   HGBA1C 6.9 (H) 12/11/2020         Finally, patient has history of Vitamin D Deficiency and last Vitamin D was at goal:  Lab Results  Component Value Date   VD25OH 72 05/10/2020    Current Outpatient Medications on File Prior to Visit  Medication Sig   amLODipine (NORVASC) 5 MG tablet Take 1 tablet daily    atenolol 25 MG tablet Take 1 tablet daily.   VITAMIN D 2000 U Take 6,000 Units  daily.   Magnesium 250 MG TABS Take 2 times daily.   triamcinolone oint 0.1 % APPLY 2 TIMES DAILY    No Known  Allergies   Past Medical History:  Diagnosis Date   Arthritis    HLD (hyperlipidemia)    HTN (hypertension)    echo (11/11) with EF 55-60%, mild LV hypertrophy, PA systolic pressure 34 mmHg   Low back pain    Pre-diabetes    diet controlled   vitamin D deficiency      Health Maintenance  Topic Date Due   Zoster Vaccines- Shingrix (1 of 2) Never done   URINE MICROALBUMIN  04/11/2020   COVID-19 Vaccine (4 - Booster for Moderna series) 12/20/2020   FOOT EXAM  05/09/2021   INFLUENZA VACCINE  06/09/2021   HEMOGLOBIN A1C  06/10/2021   OPHTHALMOLOGY EXAM  09/16/2021   TETANUS/TDAP  09/21/2022   PNA vac Low Risk Adult  Completed   HPV VACCINES  Aged Out   DEXA SCAN  Discontinued     Immunization History  Administered Date(s) Administered   Influenza Split 10/11/2013   Influenza, High Dose Seasonal PF 09/18/2014, 09/20/2015, 08/04/2017, 09/16/2018, 10/16/2019   Influenza, Seasonal, Injecte, Preservative Fre 10/15/2016   Influenza-Unspecified 10/10/2012, 08/09/2020   Moderna Sars-Covid-2 Vaccination 11/27/2019, 12/28/2019, 09/19/2020   Pneumococcal Conjugate-13 12/19/2014   Pneumococcal Polysaccharide-23 10/11/2013   Tdap 09/21/2012    Last Colon - 2008 - Dr Amedeo Plenty -> aged out   Last MGM - 10/23/2019   Past  Surgical History:  Procedure Laterality Date   ABDOMINAL HYSTERECTOMY     partial   abnormal EKG     BACK SURGERY     cataract surgery     colonoscopsy  2008   Neg  Dr Amedeo Plenty   EYE SURGERY Bilateral    cataract   JOINT REPLACEMENT     left hip replacement , right knee replacement    lexiscan myoview  11/11   EF 81%, normal perfusion images suggesting no ischemia or infarction   SPINE SURGERY     lumbar   surgery for DDD     TOTAL HIP ARTHROPLASTY  12/02/2012   Procedure: TOTAL HIP ARTHROPLASTY;  Surgeon: Gearlean Alf, MD    Family History  Problem Relation Age of Onset   Heart disease Mother    Diabetes Other        DM - sibiling    Coronary  artery disease Neg Hx        no premature CAd     Social History   Tobacco Use   Smoking status: Former    Pack years: 0.00    Types: Cigarettes    Quit date: 11/09/1970    Years since quitting: 50.5   Smokeless tobacco: Never  Substance Use Topics   Alcohol use: Yes    Comment: occasional    Drug use: No      ROS Constitutional: Denies fever, chills, weight loss/gain, headaches, insomnia,  night sweats, and change in appetite. Does c/o fatigue. Eyes: Denies redness, blurred vision, diplopia, discharge, itchy, watery eyes.  ENT: Denies discharge, congestion, post nasal drip, epistaxis, sore throat, earache, hearing loss, dental pain, Tinnitus, Vertigo, Sinus pain, snoring.  Cardio: Denies chest pain, palpitations, irregular heartbeat, syncope, dyspnea, diaphoresis, orthopnea, PND, claudication, edema Respiratory: denies cough, dyspnea, DOE, pleurisy, hoarseness, laryngitis, wheezing.  Gastrointestinal: Denies dysphagia, heartburn, reflux, water brash, pain, cramps, nausea, vomiting, bloating, diarrhea, constipation, hematemesis, melena, hematochezia, jaundice, hemorrhoids Genitourinary: Denies dysuria, frequency, urgency, nocturia, hesitancy, discharge, hematuria, flank pain Breast: Breast lumps, nipple discharge, bleeding.  Musculoskeletal: Denies arthralgia, myalgia, stiffness, Jt. Swelling, pain, limp, and strain/sprain. Denies falls. Skin: Denies puritis, rash, hives, warts, acne, eczema, changing in skin lesion Neuro: No weakness, tremor, incoordination, spasms, paresthesia, pain Psychiatric: Denies confusion, memory loss, sensory loss. Denies Depression. Endocrine: Denies change in weight, skin, hair change, nocturia, and paresthesia, diabetic polys, visual blurring, hyper / hypo glycemic episodes.  Heme/Lymph: No excessive bleeding, bruising, enlarged lymph nodes.  Physical Exam  BP 125/66   Pulse 77   Temp (!) 97.5 F (36.4 C)   Resp 16   Ht '4\' 10"'$  (1.473 m)   Wt 129  lb (58.5 kg)   SpO2 98%   BMI 26.96 kg/m   General Appearance: Well nourished, well groomed and in no apparent distress.  Eyes: PERRLA, EOMs, conjunctiva no swelling or erythema, normal fundi and vessels. Sinuses: No frontal/maxillary tenderness ENT/Mouth: EACs patent / TMs  nl. Nares clear without erythema, swelling, mucoid exudates. Oral hygiene is good. No erythema, swelling, or exudate. Tongue normal, non-obstructing. Tonsils not swollen or erythematous. Hearing normal.  Neck: Supple, thyroid not palpable. No bruits, nodes or JVD. Respiratory: Respiratory effort normal.  BS equal and clear bilateral without rales, rhonci, wheezing or stridor. Cardio: Heart sounds are normal with regular rate and rhythm and no murmurs, rubs or gallops. Peripheral pulses are normal and equal bilaterally without edema. No aortic or femoral bruits. Chest: symmetric with normal excursions and percussion. Breasts: Symmetric, without  lumps, nipple discharge, retractions, or fibrocystic changes.  Abdomen: Flat, soft with bowel sounds active. Nontender, no guarding, rebound, hernias, masses, or organomegaly.  Lymphatics: Non tender without lymphadenopathy.  Musculoskeletal: Full ROM all peripheral extremities, joint stability, 5/5 strength, and normal gait. Skin: Warm and dry without rashes, lesions, cyanosis, clubbing or  ecchymosis.  Neuro: Cranial nerves intact, reflexes equal bilaterally. Normal muscle tone, no cerebellar symptoms. Sensation intact.  Pysch: Alert and oriented X 3, normal affect, Insight and Judgment appropriate.    Assessment and Plan  1. Annual Preventative Screening Examination   2. Essential hypertension  - EKG 12-Lead - Urinalysis, Routine w reflex microscopic - CBC with Differential/Platelet - COMPLETE METABOLIC PANEL WITH GFR - Magnesium - TSH  3. Hyperlipidemia associated with type 2 diabetes mellitus (HCC)  - EKG 12-Lead - HM DIABETES FOOT EXAM - LOW EXTREMITY NEUR  EXAM DOCUM  4. Type 2 diabetes mellitus with stage 4 chronic kidney  disease, without long-term current use of insulin (HCC)  - EKG 12-Lead - Hemoglobin A1c  5. Vitamin D deficiency  - VITAMIN D 25 Hydroxy   6. ASHD (arteriosclerotic heart disease)  - EKG 12-Lead  7. Diet-controlled diabetes mellitus ([place])  - Hemoglobin A1c  8. Screening for colorectal cancer  - POC Hemoccult Bld/Stl   9. Screening for ischemic heart disease  - EKG 12-Lead  10. FHx: heart disease  - EKG 12-Lead  11. Former smoker  - EKG 12-Lead  12. Medication management  - Urinalysis, Routine w reflex microscopic - CBC with Differential/Platelet - COMPLETE METABOLIC PANEL WITH GFR - Magnesium - TSH - Hemoglobin A1c - VITAMIN D 25 Hydroxy          Patient was counseled in prudent diet to achieve/maintain BMI less than 25 for weight control, BP monitoring, regular exercise and medications. Discussed med's effects and SE's. Screening labs and tests as requested with regular follow-up as recommended. Over 40 minutes of exam, counseling, chart review and high complex critical decision making was performed.   Kirtland Bouchard, MD

## 2021-05-15 ENCOUNTER — Ambulatory Visit (INDEPENDENT_AMBULATORY_CARE_PROVIDER_SITE_OTHER): Payer: Medicare Other | Admitting: Internal Medicine

## 2021-05-15 ENCOUNTER — Other Ambulatory Visit: Payer: Self-pay

## 2021-05-15 VITALS — BP 125/66 | HR 77 | Temp 97.5°F | Resp 16 | Ht <= 58 in | Wt 129.0 lb

## 2021-05-15 DIAGNOSIS — Z79899 Other long term (current) drug therapy: Secondary | ICD-10-CM

## 2021-05-15 DIAGNOSIS — I10 Essential (primary) hypertension: Secondary | ICD-10-CM

## 2021-05-15 DIAGNOSIS — Z0000 Encounter for general adult medical examination without abnormal findings: Secondary | ICD-10-CM

## 2021-05-15 DIAGNOSIS — Z8249 Family history of ischemic heart disease and other diseases of the circulatory system: Secondary | ICD-10-CM | POA: Diagnosis not present

## 2021-05-15 DIAGNOSIS — E1169 Type 2 diabetes mellitus with other specified complication: Secondary | ICD-10-CM

## 2021-05-15 DIAGNOSIS — I2510 Atherosclerotic heart disease of native coronary artery without angina pectoris: Secondary | ICD-10-CM | POA: Diagnosis not present

## 2021-05-15 DIAGNOSIS — Z1212 Encounter for screening for malignant neoplasm of rectum: Secondary | ICD-10-CM

## 2021-05-15 DIAGNOSIS — Z0001 Encounter for general adult medical examination with abnormal findings: Secondary | ICD-10-CM

## 2021-05-15 DIAGNOSIS — E559 Vitamin D deficiency, unspecified: Secondary | ICD-10-CM

## 2021-05-15 DIAGNOSIS — E119 Type 2 diabetes mellitus without complications: Secondary | ICD-10-CM

## 2021-05-15 DIAGNOSIS — N184 Chronic kidney disease, stage 4 (severe): Secondary | ICD-10-CM

## 2021-05-15 DIAGNOSIS — Z87891 Personal history of nicotine dependence: Secondary | ICD-10-CM

## 2021-05-15 DIAGNOSIS — Z136 Encounter for screening for cardiovascular disorders: Secondary | ICD-10-CM

## 2021-05-16 LAB — CBC WITH DIFFERENTIAL/PLATELET
Absolute Monocytes: 800 cells/uL (ref 200–950)
Basophils Absolute: 43 cells/uL (ref 0–200)
Basophils Relative: 0.5 %
Eosinophils Absolute: 86 cells/uL (ref 15–500)
Eosinophils Relative: 1 %
HCT: 36.7 % (ref 35.0–45.0)
Hemoglobin: 12.3 g/dL (ref 11.7–15.5)
Lymphs Abs: 3070 cells/uL (ref 850–3900)
MCH: 31 pg (ref 27.0–33.0)
MCHC: 33.5 g/dL (ref 32.0–36.0)
MCV: 92.4 fL (ref 80.0–100.0)
MPV: 11.7 fL (ref 7.5–12.5)
Monocytes Relative: 9.3 %
Neutro Abs: 4601 cells/uL (ref 1500–7800)
Neutrophils Relative %: 53.5 %
Platelets: 203 10*3/uL (ref 140–400)
RBC: 3.97 10*6/uL (ref 3.80–5.10)
RDW: 11.8 % (ref 11.0–15.0)
Total Lymphocyte: 35.7 %
WBC: 8.6 10*3/uL (ref 3.8–10.8)

## 2021-05-16 LAB — HEMOGLOBIN A1C
Hgb A1c MFr Bld: 6.5 % of total Hgb — ABNORMAL HIGH (ref <5.7)
Mean Plasma Glucose: 140 mg/dL
eAG (mmol/L): 7.7 mmol/L

## 2021-05-16 LAB — URINALYSIS, ROUTINE W REFLEX MICROSCOPIC
Bilirubin Urine: NEGATIVE
Glucose, UA: NEGATIVE
Ketones, ur: NEGATIVE
Nitrite: NEGATIVE
Specific Gravity, Urine: 1.012 (ref 1.001–1.035)
pH: 7 (ref 5.0–8.0)

## 2021-05-16 LAB — COMPLETE METABOLIC PANEL WITH GFR
AG Ratio: 1.6 (calc) (ref 1.0–2.5)
ALT: 7 U/L (ref 6–29)
AST: 12 U/L (ref 10–35)
Albumin: 4.7 g/dL (ref 3.6–5.1)
Alkaline phosphatase (APISO): 85 U/L (ref 37–153)
BUN/Creatinine Ratio: 16 (calc) (ref 6–22)
BUN: 30 mg/dL — ABNORMAL HIGH (ref 7–25)
CO2: 25 mmol/L (ref 20–32)
Calcium: 10.1 mg/dL (ref 8.6–10.4)
Chloride: 104 mmol/L (ref 98–110)
Creat: 1.9 mg/dL — ABNORMAL HIGH (ref 0.60–0.88)
GFR, Est African American: 26 mL/min/{1.73_m2} — ABNORMAL LOW (ref >=60)
GFR, Est Non African American: 22 mL/min/{1.73_m2} — ABNORMAL LOW (ref >=60)
Globulin: 2.9 g/dL (calc) (ref 1.9–3.7)
Glucose, Bld: 139 mg/dL — ABNORMAL HIGH (ref 65–99)
Potassium: 4.7 mmol/L (ref 3.5–5.3)
Sodium: 140 mmol/L (ref 135–146)
Total Bilirubin: 0.5 mg/dL (ref 0.2–1.2)
Total Protein: 7.6 g/dL (ref 6.1–8.1)

## 2021-05-16 LAB — MAGNESIUM: Magnesium: 2.7 mg/dL — ABNORMAL HIGH (ref 1.5–2.5)

## 2021-05-16 LAB — TSH: TSH: 2.67 mIU/L (ref 0.40–4.50)

## 2021-05-16 LAB — MICROSCOPIC MESSAGE

## 2021-05-16 LAB — VITAMIN D 25 HYDROXY (VIT D DEFICIENCY, FRACTURES): Vit D, 25-Hydroxy: 74 ng/mL (ref 30–100)

## 2021-05-17 ENCOUNTER — Other Ambulatory Visit: Payer: Self-pay | Admitting: Internal Medicine

## 2021-05-17 DIAGNOSIS — N3000 Acute cystitis without hematuria: Secondary | ICD-10-CM

## 2021-05-17 MED ORDER — NITROFURANTOIN MONOHYD MACRO 100 MG PO CAPS
ORAL_CAPSULE | ORAL | 0 refills | Status: DC
Start: 2021-05-17 — End: 2021-06-21

## 2021-05-17 NOTE — Progress Notes (Signed)
============================================================ -   Test results slightly outside the reference range are not unusual. If there is anything important, I will review this with you,  otherwise it is considered normal test values.  If you have further questions,  please do not hesitate to contact me at the office or via My Chart.  ============================================================ ============================================================  -  U/A shows a UTI  So sent in Rx for \                                  Macrobid Antibiotic 2 x /day on a full stomach for 1 week  - Then needs to schedule a Nurse Visit in 3-4 weeks                                                                   between Aug 1  to Aug  14                                                                               to recheck Urine for infection ============================================================ ============================================================  -  Kidney functions stable Stage 4 over the last 2 years  ============================================================ ============================================================  -  A1c = 6.5%  still in early or borderline Diabetic range   - So   - Avoid Sweets, Candy & White Stuff   - White Rice, White [place], White Flour  - Breads &  Pasta ============================================================ ============================================================  -  Vitamin D = 74 =- Excellent  !  Please keep dose Vitamin D Same  ============================================================ ============================================================  -  All Else - CBC  - Electrolytes - Liver - Magnesium & Thyroid    - all  Normal / OK ============================================================ ============================================================

## 2021-05-22 ENCOUNTER — Telehealth: Payer: Self-pay

## 2021-05-22 NOTE — Telephone Encounter (Signed)
Left message with her daughter, [name] to review lab results with her.

## 2021-06-17 ENCOUNTER — Ambulatory Visit (INDEPENDENT_AMBULATORY_CARE_PROVIDER_SITE_OTHER): Payer: Medicare Other

## 2021-06-17 ENCOUNTER — Other Ambulatory Visit: Payer: Self-pay

## 2021-06-17 DIAGNOSIS — N3000 Acute cystitis without hematuria: Secondary | ICD-10-CM | POA: Diagnosis not present

## 2021-06-17 NOTE — Progress Notes (Deleted)
Acute Office Visit  Subjective:    Patient ID: Jeanette Espinoza, female    DOB: 1925/07/03, 87 y.o.   MRN: [id number]  Chief Complaint  Patient presents with   Labs Only    Recheck Urine    HPI Patient is in today for a recheck of urine. Completed Macrobid and states that she has no signs or symptoms of having a UTI.   Past Medical History:  Diagnosis Date   Arthritis    HLD (hyperlipidemia)    HTN (hypertension)    echo (11/11) with EF 55-60%, mild LV hypertrophy, PA systolic pressure 34 mmHg   Low back pain    Pre-diabetes    diet controlled   Unspecified vitamin D deficiency     Past Surgical History:  Procedure Laterality Date   ABDOMINAL HYSTERECTOMY     partial   abnormal EKG     BACK SURGERY     cataract surgery     colonoscopsy  2008   Neg  Dr Amedeo Plenty   EYE SURGERY Bilateral    cataract   JOINT REPLACEMENT     left hip replacement , right knee replacement    lexiscan myoview  11/11   EF 81%, normal perfusion images suggesting no ischemia or infarction   SPINE SURGERY     lumbar   surgery for DDD     TOTAL HIP ARTHROPLASTY  12/02/2012   Procedure: TOTAL HIP ARTHROPLASTY;  Surgeon: Gearlean Alf, MD;  Location: [facility];  Service: Orthopedics;  Laterality: Right;    Family History  Problem Relation Age of Onset   Heart disease Mother    Diabetes Other        DM - sibiling    Coronary artery disease Neg Hx        no premature CAd     Social History   Socioeconomic History   Marital status: Single    Spouse name: Not on file   Number of children: Not on file   Years of education: Not on file   Highest education level: Not on file  Occupational History   Not on file  Tobacco Use   Smoking status: Former    Types: Cigarettes    Quit date: 11/09/1970    Years since quitting: 50.6   Smokeless tobacco: Never  Substance and Sexual Activity   Alcohol use: Yes    Comment: occasional    Drug use: No   Sexual activity: Not on file  Other Topics  Concern   Not on file  Social History Narrative   Retired, lives in [place]. Widowed   Daughter in [place]. [name] [phone])   Does not get regular exercise   Social Determinants of Health   Financial Resource Strain: Not on file  Food Insecurity: Not on file  Transportation Needs: Not on file  Physical Activity: Not on file  Stress: Not on file  Social Connections: Not on file  Intimate Partner Violence: Not on file    Outpatient Medications Prior to Visit  Medication Sig Dispense Refill   amLODipine (NORVASC) 5 MG tablet Take 1 tablet by mouth once daily for blood pressure 90 tablet 1   atenolol (TENORMIN) 25 MG tablet Take 1 tablet (25 mg total) by mouth daily. 90 tablet 1   Cholecalciferol (VITAMIN D) 2000 UNITS CAPS Take 6,000 Units by mouth daily.     Magnesium 250 MG TABS Take by mouth 2 (two) times daily.     nitrofurantoin, macrocrystal-monohydrate, (MACROBID)  100 MG capsule Take 1 capsule 2 x /day with Meals for 1 week for UTI 14 capsule 0   predniSONE (DELTASONE) 20 MG tablet 2 tablets daily for 3 days, 1 tablet daily for 4 days. 10 tablet 0   triamcinolone ointment (KENALOG) 0.1 % APPLY 1 APPLICATION TOPICALLY 2 TIMES DAILY 80 g 0   No facility-administered medications prior to visit.    No Known Allergies  Review of Systems     Objective:    Physical Exam  There were no vitals taken for this visit. Wt Readings from Last 3 Encounters:  05/15/21 129 lb (58.5 kg)  12/11/20 131 lb (59.4 kg)  05/10/20 131 lb 12.8 oz (59.8 kg)    Health Maintenance Due  Topic Date Due   Zoster Vaccines- Shingrix (1 of 2) Never done   URINE MICROALBUMIN  04/11/2020   COVID-19 Vaccine (4 - Booster for Moderna series) 12/20/2020   INFLUENZA VACCINE  06/09/2021    There are no preventive care reminders to display for this patient.   Lab Results  Component Value Date   TSH 2.67 05/15/2021   Lab Results  Component Value Date   WBC 8.6 05/15/2021   HGB 12.3 05/15/2021    HCT 36.7 05/15/2021   MCV 92.4 05/15/2021   PLT 203 05/15/2021   Lab Results  Component Value Date   NA 140 05/15/2021   K 4.7 05/15/2021   CO2 25 05/15/2021   GLUCOSE 139 (H) 05/15/2021   BUN 30 (H) 05/15/2021   CREATININE 1.90 (H) 05/15/2021   BILITOT 0.5 05/15/2021   ALKPHOS 50 04/27/2017   AST 12 05/15/2021   ALT 7 05/15/2021   PROT 7.6 05/15/2021   ALBUMIN 4.3 04/27/2017   CALCIUM 10.1 05/15/2021   Lab Results  Component Value Date   CHOL 295 (H) 07/13/2019   Lab Results  Component Value Date   HDL 49 (L) 07/13/2019   Lab Results  Component Value Date   LDLCALC 200 (H) 07/13/2019   Lab Results  Component Value Date   TRIG 260 (H) 07/13/2019   Lab Results  Component Value Date   CHOLHDL 6.0 (H) 07/13/2019   Lab Results  Component Value Date   HGBA1C 6.5 (H) 05/15/2021       Assessment & Plan:   Problem List Items Addressed This Visit   None Visit Diagnoses     Acute cystitis without hematuria            No orders of the defined types were placed in this encounter.    Chancy Hurter, CMA

## 2021-06-17 NOTE — Progress Notes (Signed)
Patient presents to the office for a nurse visit to have her urine rechecked. Completed Macrobid for a UTI. Not having any signs or symptoms at this time. Vitals taken and recorded.

## 2021-06-20 LAB — URINALYSIS, ROUTINE W REFLEX MICROSCOPIC
Bilirubin Urine: NEGATIVE
Glucose, UA: NEGATIVE
Hgb urine dipstick: NEGATIVE
Hyaline Cast: NONE SEEN /LPF
Ketones, ur: NEGATIVE
Nitrite: POSITIVE — AB
RBC / HPF: NONE SEEN /HPF (ref 0–2)
Specific Gravity, Urine: 1.013 (ref 1.001–1.035)
pH: 6.5 (ref 5.0–8.0)

## 2021-06-20 LAB — URINE CULTURE
MICRO NUMBER:: 12226278
SPECIMEN QUALITY:: ADEQUATE

## 2021-06-20 LAB — MICROSCOPIC MESSAGE

## 2021-06-21 ENCOUNTER — Other Ambulatory Visit: Payer: Self-pay | Admitting: Internal Medicine

## 2021-06-21 DIAGNOSIS — N3000 Acute cystitis without hematuria: Secondary | ICD-10-CM

## 2021-06-21 MED ORDER — CIPROFLOXACIN HCL 250 MG PO TABS: ORAL_TABLET | ORAL | 0 refills | Status: DC

## 2021-06-21 NOTE — Progress Notes (Signed)
============================================================ ============================================================  -    Urine culture still shows Infection & grew an E.coli  bacteria  - So,  sent in Rx for a different Antibiotic    Please schedule a nurse visit about 2 weeks ( betw Sept 12 - 23)                                After finish the Antibiotic to recheck the urine                                                                    to assure the infection is cleared up   ============================================================ ============================================================  -

## 2021-07-04 ENCOUNTER — Other Ambulatory Visit: Payer: Self-pay | Admitting: Adult Health

## 2021-07-04 DIAGNOSIS — I10 Essential (primary) hypertension: Secondary | ICD-10-CM

## 2021-07-07 ENCOUNTER — Other Ambulatory Visit: Payer: Self-pay

## 2021-07-07 ENCOUNTER — Ambulatory Visit (INDEPENDENT_AMBULATORY_CARE_PROVIDER_SITE_OTHER): Payer: Medicare Other

## 2021-07-07 DIAGNOSIS — N3000 Acute cystitis without hematuria: Secondary | ICD-10-CM

## 2021-07-07 NOTE — Progress Notes (Signed)
The patient came in for a UA and reports that she finished her antibiotic regimen a week ago. She reports no symptoms or urinary issues at this time.

## 2021-07-08 LAB — URINALYSIS, ROUTINE W REFLEX MICROSCOPIC
Bacteria, UA: NONE SEEN /HPF
Bilirubin Urine: NEGATIVE
Glucose, UA: NEGATIVE
Hgb urine dipstick: NEGATIVE
Hyaline Cast: NONE SEEN /LPF
Ketones, ur: NEGATIVE
Nitrite: NEGATIVE
RBC / HPF: NONE SEEN /HPF (ref 0–2)
Specific Gravity, Urine: 1.014 (ref 1.001–1.035)
pH: 6.5 (ref 5.0–8.0)

## 2021-07-08 LAB — URINE CULTURE
MICRO NUMBER:: 12304409
SPECIMEN QUALITY:: ADEQUATE

## 2021-07-08 LAB — MICROSCOPIC MESSAGE

## 2021-07-08 NOTE — Progress Notes (Signed)
============================================================ -   call daughter Jeanette Espinoza  [id number]  ============================================================  - Urine - OK  now - culture only show n normal skin bacteria  & NOT Infection  - Still recommend take      Vitamin C  500 mg     2 x /day                                                        to acidify urine to prevent Bacterial infections   -  And of course Drinks lots of water to "flush" the bladder   ============================================================ ============================================================

## 2021-08-14 NOTE — Progress Notes (Signed)
FOLLOW UP  Assessment and Plan:   Hypertension  Well controlled with current medications  Monitor blood pressure at home; patient to call if consistently greater than 130/80 Continue DASH diet.   Reminder to go to the ER if any CP, SOB, nausea, dizziness, severe HA, changes vision/speech, left arm numbness and tingling and jaw pain. -CBC  Cholesterol Not at goal; treated by lifestyle without medications secondary to age  Lifestyle reviewed at length Continue low cholesterol diet and exercise.  - lipid panel.   Diabetes with diabetic chronic kidney disease Currently with A1Cs in prediabetic range off of medications Continue diet and exercise.  Perform daily foot/skin check, notify office of any concerning changes.  -Check A1C, CMP with GFR  Stage 4 CKD secondary to diabetes  Increase fluids, avoid NSAIDS, monitor sugars, will monitor Nephrology referral has been discussed, 87patient preference to defer in light of stable values and continue to monitor closely via our office - she declines nephrology referral; prefers to continue q78mmonitoring out of our office; GFR has been stable and in consideration of her age this is reasonable; will refer if any significant further decline -Microalbumin/creatinine urine ratio  Vitamin D Def/ osteoporosis prevention At goal at recent check;  Defer vitamin D level   Continue diet and meds as discussed. Further disposition pending results of labs. Discussed med's effects and SE's.   Over 30 minutes of exam, counseling, chart review, and critical decision making was performed.   Future Appointments  Date Time Provider [place] 12/11/2021 11:30 AM CLiane Comber NP [facility] None  05/15/2022 10:00 AM MUnk Pinto MD [facility] None    ----------------------------------------------------------------------------------------------------------------------  HPI 87y.o. Caucasian female who lives in assisted living community  presents for 3 month follow up on hypertension, cholesterol, T2 diabetes, stable 4th stage CKD and vitamin D deficiency.   Had 1 episode of nausea and vomiting on Saturday, no vomiting since but still has a little nausea.  Appetite is doing better avoiding lettuce. Has noted more fatigue x 2 weeks.   Pt was having some right leg sciatic pain but is now better. Walks steady with a walker. Has seen orthopedics this past year and was advised sciatic and was going to have steroid injection but improved on her own.   BMI is Body mass index is 26.54 kg/m., she has been working on diet, pt has been walking but not as much, walks mostly in her apartment. Wt Readings from Last 3 Encounters:  08/15/21 127 lb (57.6 kg)  07/07/21 128 lb 9.6 oz (58.3 kg)  06/17/21 128 lb 6.4 oz (58.2 kg)   Her blood pressure has been controlled at home, today their BP is BP: 128/62  She does not workout - she is limited by ability to ambulate - she reports she does make an effort to walk daily with her walker.  She denies chest pain, shortness of breath, dizziness.   She is not on cholesterol medication secondary to age and denies myalgias. Her cholesterol is not at goal. The cholesterol last visit was:   Lab Results  Component Value Date   CHOL 295 (H) 07/13/2019   HDL 49 (L) 07/13/2019   LDLCALC 200 (H) 07/13/2019   TRIG 260 (H) 07/13/2019   CHOLHDL 6.0 (H) 07/13/2019    She has been working on diet and exercise for diet controlled T2 diabetes (A1C 6.6% in 2016), and denies nausea, polydipsia, polyuria, visual disturbances, vomiting and weight loss. She has recently demonstrated improved A1Cs in  prediabetic range. Last A1C in the office was:  Lab Results  Component Value Date   HGBA1C 6.5 (H) 05/15/2021   Patient is on Vitamin D supplement and was at goal at last visit:   Lab Results  Component Value Date   VD25OH 74 05/15/2021     She has stable stage 4 CKD secondary to diabetes:  Lab Results  Component  Value Date   GFRNONAA 22 (L) 05/15/2021   GFRNONAA 22 (L) 12/11/2020   GFRNONAA 24 (L) 05/10/2020    Current Medications:  Current Outpatient Medications on File Prior to Visit  Medication Sig   amLODipine (NORVASC) 5 MG tablet Take 1 tablet by mouth once daily for blood pressure   Ascorbic Acid (VITAMIN C) 1000 MG tablet Take 1,000 mg by mouth daily.   atenolol (TENORMIN) 25 MG tablet Take 1 tablet by mouth once daily   Cholecalciferol (VITAMIN D) 2000 UNITS CAPS Take 6,000 Units by mouth daily.   Magnesium 250 MG TABS Take by mouth 2 (two) times daily.   triamcinolone ointment (KENALOG) 0.1 % APPLY 1 APPLICATION TOPICALLY 2 TIMES DAILY   ciprofloxacin (CIPRO) 250 MG tablet Take  1 tablet  2 x /day  with food for 10 days for UTI (Patient not taking: Reported on 08/15/2021)   No current facility-administered medications on file prior to visit.     Allergies: No Known Allergies   Medical History:  Past Medical History:  Diagnosis Date   Arthritis    HLD (hyperlipidemia)    HTN (hypertension)    echo (11/11) with EF 55-60%, mild LV hypertrophy, PA systolic pressure 34 mmHg   Low back pain    Pre-diabetes    diet controlled   Unspecified vitamin D deficiency    Family history- Reviewed and unchanged Social history- Reviewed and unchanged   Review of Systems:  Review of Systems  Constitutional:  Negative for malaise/fatigue and weight loss.  HENT:  Negative for congestion, hearing loss, sore throat and tinnitus.   Eyes:  Negative for blurred vision and double vision.  Respiratory:  Negative for cough, sputum production, shortness of breath and wheezing.   Cardiovascular:  Negative for chest pain, palpitations, orthopnea, claudication and leg swelling.  Gastrointestinal:  Positive for nausea. Negative for abdominal pain, blood in stool, constipation, diarrhea, heartburn, melena and vomiting.  Genitourinary: Negative.   Musculoskeletal:  Negative for joint pain and myalgias.   Skin:  Negative for rash.  Neurological:  Negative for dizziness, tingling, sensory change, weakness and headaches.  Endo/Heme/Allergies:  Negative for polydipsia.  Psychiatric/Behavioral: Negative.    All other systems reviewed and are negative.   Physical Exam: BP 128/62   Pulse 81   Temp 97.7 F (36.5 C)   Wt 127 lb (57.6 kg)   SpO2 97%   BMI 26.54 kg/m  Wt Readings from Last 3 Encounters:  08/15/21 127 lb (57.6 kg)  07/07/21 128 lb 9.6 oz (58.3 kg)  06/17/21 128 lb 6.4 oz (58.2 kg)   General Appearance: Well nourished, well dressed elder, in no apparent distress. Eyes: PERRLA, EOMs, conjunctiva no swelling or erythema Sinuses: No Frontal/maxillary tenderness ENT/Mouth: Ext aud canals clear, TMs without erythema, bulging. No erythema, swelling, or exudate on post pharynx.  Tonsils not swollen or erythematous. Somewhat HOH Neck: Supple, thyroid normal.  Respiratory: Respiratory effort normal, BS equal bilaterally without rales, rhonchi, wheezing or stridor.  Cardio: RRR with no MRGs. Brisk peripheral pulses without edema.  Abdomen: Soft, + BS.  Non tender,  no guarding, rebound, hernias, masses. Lymphatics: Non tender without lymphadenopathy.  Musculoskeletal: No obvious deformity, Full ROM, symmetrical strength, Ambulates slowly with walker Skin: Warm, dry without rashes, lesions, ecchymosis.  Neuro: Cranial nerves intact. No cerebellar symptoms.  Psych: Awake and oriented X 3, normal affect, Insight and Judgment appropriate.    Magda Bernheim, NP 10:28 AM [facility]

## 2021-08-15 ENCOUNTER — Encounter: Payer: Self-pay | Admitting: Nurse Practitioner

## 2021-08-15 ENCOUNTER — Other Ambulatory Visit: Payer: Self-pay

## 2021-08-15 ENCOUNTER — Ambulatory Visit: Payer: Medicare Other | Admitting: Nurse Practitioner

## 2021-08-15 VITALS — BP 128/62 | HR 81 | Temp 97.7°F | Wt 127.0 lb

## 2021-08-15 DIAGNOSIS — I10 Essential (primary) hypertension: Secondary | ICD-10-CM | POA: Diagnosis not present

## 2021-08-15 DIAGNOSIS — Z23 Encounter for immunization: Secondary | ICD-10-CM | POA: Diagnosis not present

## 2021-08-15 DIAGNOSIS — E1122 Type 2 diabetes mellitus with diabetic chronic kidney disease: Secondary | ICD-10-CM | POA: Diagnosis not present

## 2021-08-15 DIAGNOSIS — N184 Chronic kidney disease, stage 4 (severe): Secondary | ICD-10-CM

## 2021-08-15 DIAGNOSIS — E1169 Type 2 diabetes mellitus with other specified complication: Secondary | ICD-10-CM | POA: Diagnosis not present

## 2021-08-15 DIAGNOSIS — E785 Hyperlipidemia, unspecified: Secondary | ICD-10-CM

## 2021-08-15 DIAGNOSIS — E559 Vitamin D deficiency, unspecified: Secondary | ICD-10-CM | POA: Diagnosis not present

## 2021-08-15 NOTE — Patient Instructions (Signed)

## 2021-08-16 LAB — COMPLETE METABOLIC PANEL WITH GFR
AG Ratio: 1.6 (calc) (ref 1.0–2.5)
ALT: 9 U/L (ref 6–29)
AST: 13 U/L (ref 10–35)
Albumin: 4.7 g/dL (ref 3.6–5.1)
Alkaline phosphatase (APISO): 75 U/L (ref 37–153)
BUN/Creatinine Ratio: 17 (calc) (ref 6–22)
BUN: 36 mg/dL — ABNORMAL HIGH (ref 7–25)
CO2: 27 mmol/L (ref 20–32)
Calcium: 9.9 mg/dL (ref 8.6–10.4)
Chloride: 101 mmol/L (ref 98–110)
Creat: 2.1 mg/dL — ABNORMAL HIGH (ref 0.60–0.95)
Globulin: 2.9 g/dL (calc) (ref 1.9–3.7)
Glucose, Bld: 168 mg/dL — ABNORMAL HIGH (ref 65–99)
Potassium: 4.4 mmol/L (ref 3.5–5.3)
Sodium: 138 mmol/L (ref 135–146)
Total Bilirubin: 0.4 mg/dL (ref 0.2–1.2)
Total Protein: 7.6 g/dL (ref 6.1–8.1)
eGFR: 21 mL/min/{1.73_m2} — ABNORMAL LOW (ref >=60)

## 2021-08-16 LAB — LIPID PANEL
Cholesterol: 307 mg/dL — ABNORMAL HIGH (ref <200)
HDL: 49 mg/dL — ABNORMAL LOW (ref >=50)
LDL Cholesterol (Calc): 198 mg/dL (calc) — ABNORMAL HIGH
Non-HDL Cholesterol (Calc): 258 mg/dL (calc) — ABNORMAL HIGH (ref <130)
Total CHOL/HDL Ratio: 6.3 (calc) — ABNORMAL HIGH (ref <5.0)
Triglycerides: 356 mg/dL — ABNORMAL HIGH (ref <150)

## 2021-08-16 LAB — CBC WITH DIFFERENTIAL/PLATELET
Absolute Monocytes: 940 cells/uL (ref 200–950)
Basophils Absolute: 38 cells/uL (ref 0–200)
Basophils Relative: 0.4 %
Eosinophils Absolute: 113 cells/uL (ref 15–500)
Eosinophils Relative: 1.2 %
HCT: 37 % (ref 35.0–45.0)
Hemoglobin: 12.2 g/dL (ref 11.7–15.5)
Lymphs Abs: 3506 cells/uL (ref 850–3900)
MCH: 30.3 pg (ref 27.0–33.0)
MCHC: 33 g/dL (ref 32.0–36.0)
MCV: 92 fL (ref 80.0–100.0)
MPV: 12.3 fL (ref 7.5–12.5)
Monocytes Relative: 10 %
Neutro Abs: 4803 cells/uL (ref 1500–7800)
Neutrophils Relative %: 51.1 %
Platelets: 197 10*3/uL (ref 140–400)
RBC: 4.02 10*6/uL (ref 3.80–5.10)
RDW: 12.8 % (ref 11.0–15.0)
Total Lymphocyte: 37.3 %
WBC: 9.4 10*3/uL (ref 3.8–10.8)

## 2021-08-16 LAB — MICROALBUMIN / CREATININE URINE RATIO
Creatinine, Urine: 62 mg/dL (ref 20–275)
Microalb Creat Ratio: 132 mcg/mg creat — ABNORMAL HIGH (ref <30)
Microalb, Ur: 8.2 mg/dL

## 2021-08-16 LAB — HEMOGLOBIN A1C
Hgb A1c MFr Bld: 6.2 % of total Hgb — ABNORMAL HIGH (ref <5.7)
Mean Plasma Glucose: 131 mg/dL
eAG (mmol/L): 7.3 mmol/L

## 2021-10-03 ENCOUNTER — Other Ambulatory Visit: Payer: Self-pay | Admitting: Adult Health

## 2021-10-06 ENCOUNTER — Other Ambulatory Visit: Payer: Self-pay

## 2021-10-06 DIAGNOSIS — I10 Essential (primary) hypertension: Secondary | ICD-10-CM

## 2021-10-06 MED ORDER — AMLODIPINE BESYLATE 5 MG PO TABS: ORAL_TABLET | ORAL | 3 refills | Status: DC

## 2021-10-06 MED ORDER — ATENOLOL 25 MG PO TABS
25.0000 mg | ORAL_TABLET | Freq: Every day | ORAL | 0 refills | Status: DC
Start: 2021-10-06 — End: 2021-12-12

## 2021-12-11 ENCOUNTER — Ambulatory Visit: Payer: Medicare Other | Admitting: Adult Health

## 2021-12-12 ENCOUNTER — Ambulatory Visit: Payer: Medicare Other | Admitting: Adult Health

## 2021-12-12 ENCOUNTER — Encounter: Payer: Self-pay | Admitting: Adult Health

## 2021-12-12 ENCOUNTER — Other Ambulatory Visit: Payer: Self-pay

## 2021-12-12 VITALS — BP 108/62 | HR 66 | Temp 97.7°F | Wt 123.0 lb

## 2021-12-12 DIAGNOSIS — I2510 Atherosclerotic heart disease of native coronary artery without angina pectoris: Secondary | ICD-10-CM | POA: Diagnosis not present

## 2021-12-12 DIAGNOSIS — I10 Essential (primary) hypertension: Secondary | ICD-10-CM

## 2021-12-12 DIAGNOSIS — E1169 Type 2 diabetes mellitus with other specified complication: Secondary | ICD-10-CM | POA: Diagnosis not present

## 2021-12-12 DIAGNOSIS — Z0001 Encounter for general adult medical examination with abnormal findings: Secondary | ICD-10-CM

## 2021-12-12 DIAGNOSIS — E785 Hyperlipidemia, unspecified: Secondary | ICD-10-CM

## 2021-12-12 DIAGNOSIS — R6889 Other general symptoms and signs: Secondary | ICD-10-CM | POA: Diagnosis not present

## 2021-12-12 DIAGNOSIS — E119 Type 2 diabetes mellitus without complications: Secondary | ICD-10-CM | POA: Diagnosis not present

## 2021-12-12 DIAGNOSIS — Z6826 Body mass index (BMI) 26.0-26.9, adult: Secondary | ICD-10-CM

## 2021-12-12 DIAGNOSIS — Z0000 Encounter for general adult medical examination without abnormal findings: Secondary | ICD-10-CM

## 2021-12-12 DIAGNOSIS — H68102 Unspecified obstruction of Eustachian tube, left ear: Secondary | ICD-10-CM

## 2021-12-12 DIAGNOSIS — N184 Chronic kidney disease, stage 4 (severe): Secondary | ICD-10-CM

## 2021-12-12 DIAGNOSIS — E1122 Type 2 diabetes mellitus with diabetic chronic kidney disease: Secondary | ICD-10-CM

## 2021-12-12 DIAGNOSIS — D692 Other nonthrombocytopenic purpura: Secondary | ICD-10-CM

## 2021-12-12 DIAGNOSIS — Z9181 History of falling: Secondary | ICD-10-CM

## 2021-12-12 DIAGNOSIS — E559 Vitamin D deficiency, unspecified: Secondary | ICD-10-CM

## 2021-12-12 DIAGNOSIS — Z9189 Other specified personal risk factors, not elsewhere classified: Secondary | ICD-10-CM

## 2021-12-12 DIAGNOSIS — Z79899 Other long term (current) drug therapy: Secondary | ICD-10-CM

## 2021-12-12 NOTE — Progress Notes (Signed)
MEDICARE ANNUAL WELLNESS VISIT AND FOLLOW UP  Assessment:   Annual Medicare Wellness Visit Due annually  Health maintenance reviewed - declines mammogram, DEXA, would not pursue treatment   Essential hypertension - low for age, not checking, concern with renals and falls risk - stop atenolol 25 mg, continue amlodipine 5 mg, start checking BP at [facility] weekly, contact office if frequently 140/90+  -DASH diet -cont exercise as tolerated - TSH   Hyperlipidemia -cont diet and exercise -hx of statin intolerance;  -would not aggressively pursue due to age, no longer checking   Vitamin D deficiency -cont Vit D supplement  Medication management - CBC with Differential/Platelet - CMP/GFR - Magnesium    Primary osteoarthritis of both hips -cont movement and exercise as tolerated  Seniles purpura ([place]) Discussed process, protect skin, sunscreen  Type 2 diabetes mellitus with stage 4 chronic kidney disease, without long-term current use of insulin (HCC) -cont diet and exercise -UTD with eye exams - report requested - Hemoglobin A1c  CKD stage 4 due to type 2 diabetes mellitus (HCC) Increase fluids, avoid NSAIDS, monitor sugars, per patient preference will monitor here unless trending down, patient would decline dialysis -     COMPLETE METABOLIC PANEL WITH GFR - - OFF ACE DUE TO POTASSIUM  ASHD (arteriosclerotic heart disease) Control blood pressure, glucose, increase exercise.  No chest pain; no longer on cholesterol med due to age, shared decision making  Moderate risk of falls Ambulates with walker; encouraged she participate in balance classes provided at [facility]; PT offered but declines; fall prevention discussed  Sedentary lifestyle Encouraged participation in exercise programs at [facility] Given chair exercises she can do daily at home   Left ear obstruction ? Soft tissue, polyp - unclear Will have her follow up with established ENT Dr. Benjamine Mola   Over 30  minutes of exam, counseling, chart review, and critical decision making was performed Future Appointments  Date Time Provider [place]  05/15/2022 10:00 AM Unk Pinto, MD [facility] None  12/16/2022 11:00 AM Liane Comber, NP [facility] None     Plan:   During the course of the visit the patient was educated and counseled about appropriate screening and preventive services including:   Pneumococcal vaccine  Influenza vaccine Td vaccine Prevnar 13 Screening electrocardiogram Screening mammography Bone densitometry screening Colorectal cancer screening Diabetes screening Glaucoma screening Nutrition counseling  Advanced directives: given info/requested copies  Subjective:   Jeanette Espinoza is a 88 y.o. female who presents for Medicare Annual Wellness Visit and 3 month follow up on hypertension, lifestyle controlled diabetes, CKD 4, hyperlipidemia, vitamin D def.   She is at [facility], uses her walker religiously, no falls in several years. She has in home help 2 hours 3 days a week. Daughter brought her today, she is no longer driving, [facility] and family drives.   She is hard of hearing, follows with Dr. Melene Plan, left has only 10 % hearing, right has 40% decline, no hearing aids due to hearing aids. Recently had a L ear infection that was followed up in December. Patient has been c/o left ear fullness and scratching.    BMI is Body mass index is 25.71 kg/m., she has not been working on diet and exercise. Ambulates with walker down long hall multiple time a day, does get winded and takes regular breaks.  Wt Readings from Last 3 Encounters:  12/12/21 123 lb (55.8 kg)  08/15/21 127 lb (57.6 kg)  07/07/21 128 lb 9.6 oz (58.3 kg)  Her blood pressure has been controlled at home, she is off ACE due to kidney function and on norvasc now and atenolol 25 in the PM, today their BP is BP: 108/62 She does not workout. She denies chest pain, dizziness.  Does get winded walking long distances, admits sedentary lifestyle for many years, not receptive to exercise.   She is not on cholesterol medication. Her cholesterol is not at goal. She is off of statin due to myalgias, not on any other meds due to age  Lab Results  Component Value Date   CHOL 307 (H) 08/15/2021   HDL 49 (L) 08/15/2021   LDLCALC 198 (H) 08/15/2021   TRIG 356 (H) 08/15/2021   CHOLHDL 6.3 (H) 08/15/2021   She does have diabetes, she has had an A1C as high as 7.1 in the past but she is controlling it with diet and exercise. She notes that she is asymptomatic.  Lab Results  Component Value Date   HGBA1C 6.2 (H) 08/15/2021   She has CKD stage 4, she was taken off her enalapril due to elevated potassium, she is on amlodipine and atenolol now.  We are monitoring here in office. She is intentionally increasing water intake though still fairly low per daughter. Quit coffee. Last GFR Lab Results  Component Value Date   GFRNONAA 22 (L) 05/15/2021   GFRNONAA 22 (L) 12/11/2020   GFRNONAA 24 (L) 05/10/2020   Lab Results  Component Value Date   MICROALBUR 8.2 08/15/2021   MICROALBUR 6.8 04/12/2019   Patient is on Vitamin D supplement. Lab Results  Component Value Date   VD25OH 74 05/15/2021       Medication Review Current Outpatient Medications on File Prior to Visit  Medication Sig Dispense Refill   amLODipine (NORVASC) 5 MG tablet Take  1 tablet  Daily  for BP                                /                 Take 1 tablet by mouth once daily 90 tablet 3   Ascorbic Acid (VITAMIN C) 1000 MG tablet Take 1,000 mg by mouth daily.     atenolol (TENORMIN) 25 MG tablet Take 1 tablet (25 mg total) by mouth daily. 90 tablet 0   Cholecalciferol (VITAMIN D) 2000 UNITS CAPS Take 6,000 Units by mouth daily.     Magnesium 250 MG TABS Take by mouth 2 (two) times daily.     triamcinolone ointment (KENALOG) 0.1 % APPLY 1 APPLICATION TOPICALLY 2 TIMES DAILY 80 g 0   No current  facility-administered medications on file prior to visit.    Current Problems (verified) Patient Active Problem List   Diagnosis Date Noted   Sedentary lifestyle 12/11/2020   At moderate risk for fall 12/11/2020   Type 2 diabetes mellitus with stage 4 chronic kidney disease, without long-term current use of insulin ([place]) 01/23/2020   Hyperlipidemia associated with type 2 diabetes mellitus ([place]) 01/23/2020   Pain due to onychomycosis of toenails of both feet 06/09/2019   Senile purpura ([place]) 09/16/2018   ASHD (arteriosclerotic heart disease) 09/14/2018   CKD stage 4 due to type 2 diabetes mellitus ([place]) 11/03/2017   Essential hypertension 01/02/2016   Medication management 02/06/2014   Diet-controlled diabetes mellitus ([place]) 02/06/2014   Vitamin D deficiency    OA (osteoarthritis) of hip 12/02/2012  Screening Tests Immunization History  Administered Date(s) Administered   Influenza Split 10/11/2013   Influenza, High Dose Seasonal PF 09/18/2014, 09/20/2015, 08/04/2017, 09/16/2018, 10/16/2019, 08/15/2021   Influenza, Seasonal, Injecte, Preservative Fre 10/15/2016   Influenza-Unspecified 10/10/2012, 08/09/2020   Moderna Sars-Covid-2 Vaccination 11/27/2019, 12/28/2019, 09/19/2020   Pneumococcal Conjugate-13 12/19/2014   Pneumococcal Polysaccharide-23 10/11/2013   Tdap 09/21/2012   Health Maintenance  Topic Date Due   COVID-19 Vaccine (4 - Booster for Moderna series) 11/14/2020   Zoster Vaccines- Shingrix (1 of 2) 03/11/2022 (Originally 06/03/1944)   HEMOGLOBIN A1C  02/13/2022   FOOT EXAM  05/14/2022   URINE MICROALBUMIN  08/15/2022   TETANUS/TDAP  09/21/2022   OPHTHALMOLOGY EXAM  10/09/2022   Pneumonia Vaccine 39+ Years old  Completed   INFLUENZA VACCINE  Completed   HPV VACCINES  Aged Out   DEXA SCAN  Discontinued   Last mammogram: 2017- declined further  DEXA: declines  Names of Other Physician/Practitioners you currently use: 1. [facility]- here for primary care 2. Dr. Sabra Heck, eye doctor, last visit 10/2021 report reqeusted 3. Dr. Marland Kitchen New provider - ? Dr. Melony Overly, dentist, last 2022  Patient Care Team: Unk Pinto, MD as PCP - General (Internal Medicine) Gaynelle Arabian, MD as Consulting Physician (Orthopedic Surgery) Harrie Foreman, [place] as Referring Physician (Optometry) Teena Irani, MD (Inactive) as Consulting Physician (Gastroenterology) Suella Broad, MD as Consulting Physician (Physical Medicine and Rehabilitation) Almyra Deforest, [place] as Consulting Physician (Cardiology)  Allergies No Known Allergies  SURGICAL HISTORY She  has a past surgical history that includes lexiscan myoview (11/11); surgery for DDD; cataract surgery; abnormal EKG; Back surgery; Total hip arthroplasty (12/02/2012); Eye surgery (Bilateral); Spine surgery; Abdominal hysterectomy; Joint replacement; and colonoscopsy (2008). FAMILY HISTORY Her family history includes Diabetes in an other family member; Heart disease in her mother. SOCIAL HISTORY She  reports that she quit smoking about 51 years ago. Her smoking use included cigarettes. She has never used smokeless tobacco. She reports current alcohol use. She reports that she does not use drugs.   MEDICARE WELLNESS OBJECTIVES: Physical activity:   Cardiac risk factors:   Depression/mood screen:   Depression screen [facility] 2/9 05/14/2021  Decreased Interest 0  Down, Depressed, Hopeless 0  PHQ - 2 Score 0    ADLs:  In your present state of health, do you have any difficulty performing the following activities: 05/14/2021  Hearing? N  Vision? N  Difficulty concentrating or making decisions? N  Walking or climbing stairs? N  Dressing or bathing? N  Doing errands, shopping? N  Some recent data might be hidden     Cognitive Testing  Alert? Yes  Normal Appearance?Yes  Oriented to person? Yes  Place? Yes   Time? Yes  Recall of three objects?  Yes  Can perform simple calculations?  Yes  Displays appropriate judgment?Yes  Can read the correct time from a watch face?Yes  EOL planning: Does Patient Have a Medical Advance Directive?: Yes Type of Advance Directive: Healthcare Power of Attorney, Living will Does patient want to make changes to medical advance directive?: No - Patient declined Copy of [place] in Chart?: Yes - validated most recent copy scanned in chart (See row information)   Objective:   Today's Vitals   12/12/21 1028  BP: 108/62  Pulse: 66  Temp: 97.7 F (36.5 C)  SpO2: 99%  Weight: 123 lb (55.8 kg)    Body mass index is 25.71 kg/m.  General appearance: alert, no  distress, WD/WN,  female HEENT: normocephalic, sclerae anicteric, R TM pearly, Left canal appears obstructed by soft fleshy polyp/protrusion, excoriation with scab to nare, no erythema or purulent discharge in canal, auricle/tragus non-tender.  Nares patent, no discharge or erythema, pharynx normal. HOH without hearing aids Oral cavity: MMM, no lesions Neck: supple, no lymphadenopathy, no thyromegaly, no masses Heart: RRR, normal S1, S2, no murmurs Lungs: CTA bilaterally, no wheezes, rhonchi, or rales Abdomen: +bs, soft, non tender, non distended, no masses, no hepatomegaly, no splenomegaly Musculoskeletal: nontender, no swelling, no obvious deformity, slow non-antalgic gait with walker Extremities: no edema, no cyanosis, no clubbing. She does have scattered ecchymoses Pulses: 2+ symmetric, upper and lower extremities, normal cap refill Neurological: alert, oriented x 3, CN2-12 intact, strength normal upper extremities and lower extremities, sensation normal throughout, DTRs 2+ throughout, no cerebellar signs, gait normal with walker Psychiatric: normal affect, behavior normal, pleasant    Medicare Attestation I have personally reviewed: The patient's medical and social history Their use of alcohol, tobacco or illicit drugs Their current medications and  supplements The patient's functional ability including ADLs,fall risks, home safety risks, cognitive, and hearing and visual impairment Diet and physical activities Evidence for depression or mood disorders  The patient's weight, height, BMI, and visual acuity have been recorded in the chart.  I have made referrals, counseling, and provided education to the patient based on review of the above and I have provided the patient with a written personalized care plan for preventive services.     Izora Ribas, NP   12/12/2021

## 2021-12-12 NOTE — Patient Instructions (Addendum)
Goals      Blood Pressure < 150/90     Exercise 3x per week (20-30 min per time)        STOP atenolol  Please start checking blood pressure once a week at home If frequently over 150/90 please call me    Please monitor your blood pressure, as we get older our body can not respond to a low blood pressure as well as it did when we were younger, for this reason we want a bit higher of a blood pressure as you get older to avoid dizziness and fatigue which can lead to falls. [place] call if your blood pressure is consistently above 150/90.

## 2021-12-13 ENCOUNTER — Other Ambulatory Visit: Payer: Self-pay | Admitting: Adult Health

## 2021-12-13 LAB — CBC WITH DIFFERENTIAL/PLATELET
Absolute Monocytes: 819 cells/uL (ref 200–950)
Basophils Absolute: 54 cells/uL (ref 0–200)
Basophils Relative: 0.6 %
Eosinophils Absolute: 279 cells/uL (ref 15–500)
Eosinophils Relative: 3.1 %
HCT: 34.7 % — ABNORMAL LOW (ref 35.0–45.0)
Hemoglobin: 11.5 g/dL — ABNORMAL LOW (ref 11.7–15.5)
Lymphs Abs: 2853 cells/uL (ref 850–3900)
MCH: 31.1 pg (ref 27.0–33.0)
MCHC: 33.1 g/dL (ref 32.0–36.0)
MCV: 93.8 fL (ref 80.0–100.0)
MPV: 11.8 fL (ref 7.5–12.5)
Monocytes Relative: 9.1 %
Neutro Abs: 4995 cells/uL (ref 1500–7800)
Neutrophils Relative %: 55.5 %
Platelets: 198 10*3/uL (ref 140–400)
RBC: 3.7 10*6/uL — ABNORMAL LOW (ref 3.80–5.10)
RDW: 13.1 % (ref 11.0–15.0)
Total Lymphocyte: 31.7 %
WBC: 9 10*3/uL (ref 3.8–10.8)

## 2021-12-13 LAB — HEMOGLOBIN A1C
Hgb A1c MFr Bld: 6.5 % of total Hgb — ABNORMAL HIGH (ref <5.7)
Mean Plasma Glucose: 140 mg/dL
eAG (mmol/L): 7.7 mmol/L

## 2021-12-13 LAB — COMPLETE METABOLIC PANEL WITH GFR
AG Ratio: 1.3 (calc) (ref 1.0–2.5)
ALT: 9 U/L (ref 6–29)
AST: 14 U/L (ref 10–35)
Albumin: 4.3 g/dL (ref 3.6–5.1)
Alkaline phosphatase (APISO): 78 U/L (ref 37–153)
BUN/Creatinine Ratio: 15 (calc) (ref 6–22)
BUN: 34 mg/dL — ABNORMAL HIGH (ref 7–25)
CO2: 28 mmol/L (ref 20–32)
Calcium: 9.9 mg/dL (ref 8.6–10.4)
Chloride: 101 mmol/L (ref 98–110)
Creat: 2.31 mg/dL — ABNORMAL HIGH (ref 0.60–0.95)
Globulin: 3.2 g/dL (calc) (ref 1.9–3.7)
Glucose, Bld: 142 mg/dL — ABNORMAL HIGH (ref 65–99)
Potassium: 5.3 mmol/L (ref 3.5–5.3)
Sodium: 138 mmol/L (ref 135–146)
Total Bilirubin: 0.4 mg/dL (ref 0.2–1.2)
Total Protein: 7.5 g/dL (ref 6.1–8.1)
eGFR: 19 mL/min/{1.73_m2} — ABNORMAL LOW (ref >=60)

## 2021-12-13 LAB — MAGNESIUM: Magnesium: 3.1 mg/dL — ABNORMAL HIGH (ref 1.5–2.5)

## 2021-12-13 LAB — TSH: TSH: 2.22 mIU/L (ref 0.40–4.50)

## 2021-12-13 LAB — VITAMIN D 25 HYDROXY (VIT D DEFICIENCY, FRACTURES): Vit D, 25-Hydroxy: 114 ng/mL — ABNORMAL HIGH (ref 30–100)

## 2021-12-16 ENCOUNTER — Encounter: Payer: Self-pay | Admitting: Adult Health

## 2021-12-16 ENCOUNTER — Other Ambulatory Visit: Payer: Self-pay | Admitting: Adult Health

## 2021-12-16 DIAGNOSIS — E1122 Type 2 diabetes mellitus with diabetic chronic kidney disease: Secondary | ICD-10-CM

## 2021-12-16 DIAGNOSIS — H9190 Unspecified hearing loss, unspecified ear: Secondary | ICD-10-CM | POA: Insufficient documentation

## 2021-12-16 DIAGNOSIS — N289 Disorder of kidney and ureter, unspecified: Secondary | ICD-10-CM

## 2021-12-29 ENCOUNTER — Telehealth: Payer: Self-pay | Admitting: Adult Health

## 2021-12-29 NOTE — Telephone Encounter (Signed)
daughter, [employer] [employer], called regarding questions about kidney lab results. Please call daughter [phone]

## 2021-12-29 NOTE — Telephone Encounter (Signed)
Spoke with [name]. Questions answered about GFR.

## 2022-01-05 ENCOUNTER — Telehealth: Payer: Self-pay

## 2022-01-05 NOTE — Telephone Encounter (Signed)
Following up about the referral for Nephrology, still hasn't heard from them.  Will you check on this please? Thank you.

## 2022-01-06 NOTE — Progress Notes (Signed)
Assessment and Plan: ? ? Please call daughter with results ? ?Jeanette Espinoza was seen today for acute visit. ? ?Diagnoses and all orders for this visit: ? ?CKD stage 4 due to type 2 diabetes mellitus ([place]) ?Continue to push fluids and avoid NSAIDS, has been referred to nephrology ?-     COMPLETE METABOLIC PANEL WITH GFR ?-     CBC with Differential/Platelet ? ?Acute serous otitis media of left ear, recurrence not specified ?Has appointment with ENT 3/14 ?Advised to not put Qtip or finger in the left ear ?If develops fever, sudden increase in pain in ear please notify the office ?-     doxycycline (VIBRAMYCIN) 50 MG capsule; Take 1 capsule (50 mg total) by mouth 2 (two) times daily. ?-     ofloxacin (FLOXIN) 0.3 % OTIC solution; Place 5 drops into the left ear daily. 7 days ?-     saccharomyces boulardii (FLORASTOR) 250 MG capsule; Take 1 capsule (250 mg total) by mouth 2 (two) times daily. ? ?  ? ?Further disposition pending results of labs. Discussed med's effects and SE's.   ?Over 30 minutes of exam, counseling, chart review, and critical decision making was performed.  ? ?Future Appointments  ?Date Time Provider [place]  ?04/02/2022 11:00 AM Liane Comber, NP [facility] None  ?07/21/2022 11:00 AM Unk Pinto, MD [facility] None  ?12/16/2022 11:00 AM Liane Comber, NP [facility] None  ? ? ?------------------------------------------------------------------------------------------------------------------ ? ? ?HPI ?BP 140/70   Pulse 92   Temp (!) 97.3 ?F (36.3 ?C)   Wt 122 lb 3.2 oz (55.4 kg)   SpO2 98%   BMI 25.54 kg/m?  ? ?88 y.o.female presents for left ear pain and drainage from her left ear bloody, has occurred x 3 weeks. States about 4 days ago she had the worst ear ache she ever had and has improved but continues to have pain.  Has been putting finger in her ear repeatedly. She does have an appointment on 01/20/22.  Had Severe ear infection in 10/2021.  Headaches on left side of forehead.  ? ?She has  been nauseated and not eating as much. She is not drinking a lot of water, but has been trying to drink more.   ?BMI is Body mass index is 25.54 kg/m?., she has not been working on diet and exercise. ?Wt Readings from Last 3 Encounters:  ?01/07/22 122 lb 3.2 oz (55.4 kg)  ?12/12/21 123 lb (55.8 kg)  ?08/15/21 127 lb (57.6 kg)  ? ?Has not gotten into nephrology as of yet. Last GFR was: ?Lab Results  ?Component Value Date  ? EGFR 19 (L) 12/12/2021  ?  ? ?Past Medical History:  ?Diagnosis Date  ? Arthritis   ? HLD (hyperlipidemia)   ? HTN (hypertension)   ? echo (11/11) with EF 55-60%, mild LV hypertrophy, PA systolic pressure 34 mmHg  ? Low back pain   ? Pre-diabetes   ? diet controlled  ? Unspecified vitamin D deficiency   ?  ? ?No Known Allergies ? ?Current Outpatient Medications on File Prior to Visit  ?Medication Sig  ? amLODipine (NORVASC) 5 MG tablet Take  1 tablet  Daily  for BP                                /                 Take 1 tablet by mouth once daily  ?  Ascorbic Acid (VITAMIN C) 1000 MG tablet Take 1,000 mg by mouth daily.  ? triamcinolone ointment (KENALOG) 0.1 % APPLY 1 APPLICATION TOPICALLY 2 TIMES DAILY  ? ?No current facility-administered medications on file prior to visit.  ? ? ?ROS: all negative except above.  ? ?Physical Exam: ? ?BP 140/70   Pulse 92   Temp (!) 97.3 ?F (36.3 ?C)   Wt 122 lb 3.2 oz (55.4 kg)   SpO2 98%   BMI 25.54 kg/m?  ? ?General Appearance: Pleasant frail elderly female, in no apparent distress. ?Eyes: PERRLA, EOMs, conjunctiva no swelling or erythema ?Sinuses: No Frontal/maxillary tenderness ?ENT/Mouth: Ext aud canals clear, Right TM without erythema, bulging. Left canal appears obstructed by soft fleshy polyp/protrusion- large amount of serosanguineous drainage with foul odor. No erythema, swelling, or exudate on post pharynx.  Tonsils not swollen or erythematous. Hearing normal.  ?Neck: Supple, thyroid normal.  ?Respiratory: Respiratory effort normal, BS equal  bilaterally without rales, rhonchi, wheezing or stridor.  ?Cardio: RRR with no MRGs. Brisk peripheral pulses without edema.  ?Abdomen: Soft, + BS.  Non tender, no guarding, rebound, hernias, masses. ?Lymphatics: Non tender without lymphadenopathy.  ?Musculoskeletal: Full ROM, 5/5 strength, slow steady gait with walker ?Skin: Warm, dry without rashes, lesions, ecchymosis.  ?Neuro: Cranial nerves intact. Normal muscle tone, no cerebellar symptoms. Sensation intact.  ?Psych: Awake and oriented X 3, normal affect,Very hard of hearing ?  ? ?Magda Bernheim, NP ?4:08 PM ?[facility] ? ?

## 2022-01-07 ENCOUNTER — Encounter: Payer: Self-pay | Admitting: Nurse Practitioner

## 2022-01-07 ENCOUNTER — Ambulatory Visit (INDEPENDENT_AMBULATORY_CARE_PROVIDER_SITE_OTHER): Payer: Medicare Other | Admitting: Nurse Practitioner

## 2022-01-07 ENCOUNTER — Other Ambulatory Visit: Payer: Self-pay

## 2022-01-07 VITALS — BP 140/70 | HR 92 | Temp 97.3°F | Wt 122.2 lb

## 2022-01-07 DIAGNOSIS — H6502 Acute serous otitis media, left ear: Secondary | ICD-10-CM | POA: Diagnosis not present

## 2022-01-07 DIAGNOSIS — E1122 Type 2 diabetes mellitus with diabetic chronic kidney disease: Secondary | ICD-10-CM

## 2022-01-07 DIAGNOSIS — N184 Chronic kidney disease, stage 4 (severe): Secondary | ICD-10-CM | POA: Diagnosis not present

## 2022-01-07 MED ORDER — OFLOXACIN 0.3 % OT SOLN
5.0000 [drp] | Freq: Every day | OTIC | 0 refills | Status: DC
Start: 2022-01-07 — End: 2022-04-08

## 2022-01-07 MED ORDER — DOXYCYCLINE HYCLATE 50 MG PO CAPS
50.0000 mg | ORAL_CAPSULE | Freq: Two times a day (BID) | ORAL | 0 refills | Status: DC
Start: 2022-01-07 — End: 2022-04-08

## 2022-01-07 MED ORDER — SACCHAROMYCES BOULARDII 250 MG PO CAPS: 250.0000 mg | ORAL_CAPSULE | Freq: Two times a day (BID) | ORAL | 2 refills | Status: DC

## 2022-01-07 NOTE — Telephone Encounter (Signed)
Lvm for status- [facility] ?

## 2022-01-08 ENCOUNTER — Other Ambulatory Visit: Payer: Self-pay | Admitting: Nurse Practitioner

## 2022-01-08 DIAGNOSIS — D508 Other iron deficiency anemias: Secondary | ICD-10-CM

## 2022-01-09 ENCOUNTER — Other Ambulatory Visit: Payer: Self-pay | Admitting: Nurse Practitioner

## 2022-01-09 LAB — CBC WITH DIFFERENTIAL/PLATELET
Absolute Monocytes: 1755 cells/uL — ABNORMAL HIGH (ref 200–950)
Basophils Absolute: 26 cells/uL (ref 0–200)
Basophils Relative: 0.2 %
Eosinophils Absolute: 66 cells/uL (ref 15–500)
Eosinophils Relative: 0.5 %
HCT: 31.6 % — ABNORMAL LOW (ref 35.0–45.0)
Hemoglobin: 10.6 g/dL — ABNORMAL LOW (ref 11.7–15.5)
Lymphs Abs: 2489 cells/uL (ref 850–3900)
MCH: 31.2 pg (ref 27.0–33.0)
MCHC: 33.5 g/dL (ref 32.0–36.0)
MCV: 92.9 fL (ref 80.0–100.0)
MPV: 10.6 fL (ref 7.5–12.5)
Monocytes Relative: 13.4 %
Neutro Abs: 8764 cells/uL — ABNORMAL HIGH (ref 1500–7800)
Neutrophils Relative %: 66.9 %
Platelets: 358 10*3/uL (ref 140–400)
RBC: 3.4 10*6/uL — ABNORMAL LOW (ref 3.80–5.10)
RDW: 12.9 % (ref 11.0–15.0)
Total Lymphocyte: 19 %
WBC: 13.1 10*3/uL — ABNORMAL HIGH (ref 3.8–10.8)

## 2022-01-09 LAB — IRON, TOTAL/TOTAL IRON BINDING CAP
%SAT: 5 % (calc) — ABNORMAL LOW (ref 16–45)
Iron: 11 ug/dL — ABNORMAL LOW (ref 45–160)
TIBC: 235 mcg/dL (calc) — ABNORMAL LOW (ref 250–450)

## 2022-01-09 LAB — COMPLETE METABOLIC PANEL WITH GFR
AG Ratio: 1.1 (calc) (ref 1.0–2.5)
ALT: 11 U/L (ref 6–29)
AST: 12 U/L (ref 10–35)
Albumin: 4 g/dL (ref 3.6–5.1)
Alkaline phosphatase (APISO): 100 U/L (ref 37–153)
BUN/Creatinine Ratio: 19 (calc) (ref 6–22)
BUN: 39 mg/dL — ABNORMAL HIGH (ref 7–25)
CO2: 21 mmol/L (ref 20–32)
Calcium: 9.7 mg/dL (ref 8.6–10.4)
Chloride: 101 mmol/L (ref 98–110)
Creat: 2.01 mg/dL — ABNORMAL HIGH (ref 0.60–0.95)
Globulin: 3.5 g/dL (calc) (ref 1.9–3.7)
Glucose, Bld: 156 mg/dL — ABNORMAL HIGH (ref 65–99)
Potassium: 5 mmol/L (ref 3.5–5.3)
Sodium: 135 mmol/L (ref 135–146)
Total Bilirubin: 0.3 mg/dL (ref 0.2–1.2)
Total Protein: 7.5 g/dL (ref 6.1–8.1)
eGFR: 23 mL/min/{1.73_m2} — ABNORMAL LOW (ref >=60)

## 2022-01-09 LAB — TEST AUTHORIZATION

## 2022-01-13 NOTE — Telephone Encounter (Signed)
[facility] lvm- patient status level 3, non urgent. To be called within 3 weeks to schedule. Notified patient family. ?

## 2022-01-16 ENCOUNTER — Other Ambulatory Visit: Payer: Self-pay

## 2022-01-16 DIAGNOSIS — Z1211 Encounter for screening for malignant neoplasm of colon: Secondary | ICD-10-CM

## 2022-01-16 DIAGNOSIS — Z1212 Encounter for screening for malignant neoplasm of rectum: Secondary | ICD-10-CM | POA: Diagnosis not present

## 2022-01-16 LAB — POC HEMOCCULT BLD/STL (HOME/3-CARD/SCREEN)
Card #2 Fecal Occult Blod, POC: NEGATIVE
Card #3 Fecal Occult Blood, POC: NEGATIVE
Fecal Occult Blood, POC: NEGATIVE

## 2022-02-16 ENCOUNTER — Other Ambulatory Visit: Payer: Self-pay | Admitting: Otolaryngology

## 2022-02-16 ENCOUNTER — Telehealth: Payer: Self-pay | Admitting: Nurse Practitioner

## 2022-02-16 DIAGNOSIS — H9192 Unspecified hearing loss, left ear: Secondary | ICD-10-CM

## 2022-02-16 NOTE — Telephone Encounter (Signed)
She does not have any cardiac issues that should prevent outpatient surgery

## 2022-02-16 NOTE — Telephone Encounter (Signed)
Pt is having surgery on her ear per the ENT but the daughter is asking if she has any cardiovascular issues that would prevent her from going to an outpatient facility. Wanting a call back regarding that please, Thank you! ?

## 2022-02-18 ENCOUNTER — Other Ambulatory Visit: Payer: Self-pay | Admitting: Otolaryngology

## 2022-02-27 ENCOUNTER — Ambulatory Visit
Admission: RE | Admit: 2022-02-27 | Discharge: 2022-02-27 | Disposition: A | Discharge status: Home / Self Care | Payer: Medicare Other | Source: Ambulatory Visit | Attending: Otolaryngology | Admitting: Otolaryngology

## 2022-02-27 DIAGNOSIS — H9192 Unspecified hearing loss, left ear: Secondary | ICD-10-CM

## 2022-03-03 ENCOUNTER — Other Ambulatory Visit: Payer: Self-pay | Admitting: Internal Medicine

## 2022-03-10 ENCOUNTER — Telehealth: Payer: Self-pay | Admitting: Nurse Practitioner

## 2022-03-10 NOTE — Telephone Encounter (Signed)
They went to see otolaryngologist  today Dr. Willette Pa, and wanted to let you know that they are concerned about bone deterioration in her ear. Sending her to get some imaging done to check for a potential mass. Not needing a call back just wanting to make you aware before her appointment at the end of the month.  ?

## 2022-03-13 ENCOUNTER — Ambulatory Visit (HOSPITAL_BASED_OUTPATIENT_CLINIC_OR_DEPARTMENT_OTHER): Admit: 2022-03-13 | Payer: Medicare Other | Admitting: Otolaryngology

## 2022-03-13 ENCOUNTER — Encounter (HOSPITAL_BASED_OUTPATIENT_CLINIC_OR_DEPARTMENT_OTHER): Payer: Self-pay

## 2022-03-13 SURGERY — EXCISION, CYST, EAR
Anesthesia: General | Laterality: Left

## 2022-04-02 ENCOUNTER — Encounter: Payer: Self-pay | Admitting: Nurse Practitioner

## 2022-04-02 ENCOUNTER — Ambulatory Visit: Payer: Medicare Other | Admitting: Nurse Practitioner

## 2022-04-02 VITALS — BP 130/76 | HR 108 | Temp 96.8°F | Wt 119.0 lb

## 2022-04-02 DIAGNOSIS — D508 Other iron deficiency anemias: Secondary | ICD-10-CM

## 2022-04-02 DIAGNOSIS — Z79899 Other long term (current) drug therapy: Secondary | ICD-10-CM

## 2022-04-02 DIAGNOSIS — E785 Hyperlipidemia, unspecified: Secondary | ICD-10-CM

## 2022-04-02 DIAGNOSIS — E119 Type 2 diabetes mellitus without complications: Secondary | ICD-10-CM

## 2022-04-02 DIAGNOSIS — E559 Vitamin D deficiency, unspecified: Secondary | ICD-10-CM

## 2022-04-02 DIAGNOSIS — I10 Essential (primary) hypertension: Secondary | ICD-10-CM

## 2022-04-02 DIAGNOSIS — E1169 Type 2 diabetes mellitus with other specified complication: Secondary | ICD-10-CM

## 2022-04-02 DIAGNOSIS — M868X8 Other osteomyelitis, other site: Secondary | ICD-10-CM

## 2022-04-02 NOTE — Progress Notes (Signed)
FOLLOW UP  Assessment and Plan:   1. Hyperlipidemia associated with type 2 diabetes mellitus ([place]) Controlled Continue lifestyle modifications. Recommended diet heavy in fruits and veggies, omega 3's. Decrease consumption of animal meats, cheeses, and dairy products. Remain active and exercise as tolerated. Continue to monitor.  - COMPLETE METABOLIC PANEL WITH GFR  2. Diet-controlled diabetes mellitus [facility]) Education: Reviewed 'ABCs' of diabetes management  A1C (<7):  Goal Met. Blood pressure (<130/80):  Goal Met Cholesterol (LDL <70):  Goal Met Continue Eye Exam yearly  Continue Dental Exam Q6 mo Discussed dietary recommendations Discussed Physical Activity recommendations Foot exam UTD   3. Other osteomyelitis, other site [facility]) Continue to follow with Dr. Megan Salon.  4. Essential hypertension Controlled  Continue lifestyle modifications Discussed DASH (Dietary Approaches to Stop Hypertension) DASH diet is lower in sodium than a typical American diet. Cut back on foods that are high in saturated fat, cholesterol, and trans fats. Eat more whole-grain foods, fish, poultry, and nuts Remain active and exercise as tolerated daily.  Monitor BP at home-Call if greater than 130/80.   - CBC with Differential/Platelet  5. Vitamin D deficiency Continue to monitor.  - VITAMIN D 25 Hydroxy (Vit-D Deficiency, Fractures)  6. Other iron deficiency anemia Discussed iron rich foods such as lean red meat and green leafy vegetables. Continue to monitor.  - CBC with Differential/Platelet - Iron, TIBC and Ferritin Panel  7. Medication management All medications discussed and reviewed in full. All questions and concerns regarding medications addressed.    - CBC with Differential/Platelet - COMPLETE METABOLIC PANEL WITH GFR   Continue diet and meds as discussed. Further disposition pending results of labs. Discussed med's effects and SE's.   Over 30 minutes of exam, counseling,  chart review, and critical decision making was performed.   Future Appointments  Date Time Provider [place]  07/21/2022 11:00 AM Unk Pinto, MD [facility] None  12/16/2022 11:00 AM Liane Comber, NP [facility] None    ----------------------------------------------------------------------------------------------------------------------  HPI 88 y.o. female  presents for 3 month follow up on hypertension, cholesterol, diabetes, weight and vitamin D deficiency.   She reports to clinic today accompanied by her daughter.  She has recently followed with ENT/Head & Neck Surgery 04/02/22, Dr. Willette Pa for evaluation and treatment of chronic otitis externa of left ear that has now developed into scalp osteomyelitis.  She has a referral in with I&D and now awaiting to be seen for tmt.  She is is no pain today.  She denies an increase in hearing loss.  Daughter and patient are discussing possible nursing home placement.  BMI is Body mass index is 24.87 kg/m., she has not been working on diet and exercise. Wt Readings from Last 3 Encounters:  04/02/22 119 lb (54 kg)  01/07/22 122 lb 3.2 oz (55.4 kg)  12/12/21 123 lb (55.8 kg)    Her blood pressure has been controlled at home, today their BP is BP: 130/76  She does not workout. She denies chest pain, shortness of breath, dizziness.  She is not on cholesterol medication. Her cholesterol is not at goal. The cholesterol last visit was:   Lab Results  Component Value Date   CHOL 307 (H) 08/15/2021   HDL 49 (L) 08/15/2021   LDLCALC 198 (H) 08/15/2021   TRIG 356 (H) 08/15/2021   CHOLHDL 6.3 (H) 08/15/2021    She has not been working on diet and exercise for prediabetes, and denies polydipsia and polyuria. Last A1C in the office was:  Lab  Results  Component Value Date   HGBA1C 6.5 (H) 12/12/2021   Patient is on Vitamin D supplement.   Lab Results  Component Value Date   VD25OH 114 (H) 12/12/2021        Current  Medications:  Current Outpatient Medications on File Prior to Visit  Medication Sig   amLODipine (NORVASC) 5 MG tablet Take  1 tablet  Daily  for BP                                /                 Take 1 tablet by mouth once daily   Ascorbic Acid (VITAMIN C) 1000 MG tablet Take 1,000 mg by mouth daily.   triamcinolone ointment (KENALOG) 0.1 % APPLY 1 APPLICATION TOPICALLY 2 TIMES DAILY   doxycycline (VIBRAMYCIN) 50 MG capsule Take 1 capsule (50 mg total) by mouth 2 (two) times daily. (Patient not taking: Reported on 04/02/2022)   ofloxacin (FLOXIN) 0.3 % OTIC solution Place 5 drops into the left ear daily. 7 days (Patient not taking: Reported on 04/02/2022)   saccharomyces boulardii (FLORASTOR) 250 MG capsule Take 1 capsule (250 mg total) by mouth 2 (two) times daily. (Patient not taking: Reported on 04/02/2022)   No current facility-administered medications on file prior to visit.     Allergies: No Known Allergies   Medical History:  Past Medical History:  Diagnosis Date   Arthritis    HLD (hyperlipidemia)    HTN (hypertension)    echo (11/11) with EF 55-60%, mild LV hypertrophy, PA systolic pressure 34 mmHg   Low back pain    Pre-diabetes    diet controlled   Unspecified vitamin D deficiency    Family history- Reviewed and unchanged Social history- Reviewed and unchanged   Review of Systems: Noted in HPI  Physical Exam: BP 130/76   Pulse (!) 108   Temp (!) 96.8 F (36 C)   Wt 119 lb (54 kg)   SpO2 97%   BMI 24.87 kg/m  Wt Readings from Last 3 Encounters:  04/02/22 119 lb (54 kg)  01/07/22 122 lb 3.2 oz (55.4 kg)  12/12/21 123 lb (55.8 kg)   General Appearance: Well nourished, in no apparent distress. Eyes: PERRLA, EOMs, conjunctiva no swelling or erythema Sinuses: No Frontal/maxillary tenderness ENT/Mouth: Ext aud canals clear, TMs without erythema, bulging. No erythema, swelling, or exudate on post pharynx.  Tonsils not swollen or erythematous. Hearing normal.   Neck: Supple, thyroid normal.  Respiratory: Respiratory effort normal, BS equal bilaterally without rales, rhonchi, wheezing or stridor.  Cardio: RRR with no MRGs. Brisk peripheral pulses without edema.  Abdomen: Soft, + BS.  Non tender, no guarding, rebound, hernias, masses. Lymphatics: Non tender without lymphadenopathy.  Musculoskeletal: Full ROM, 5/5 strength, Ambulates with walker gait Skin: Warm, dry without rashes, lesions, ecchymosis.  Neuro: Cranial nerves intact. No cerebellar symptoms.  Psych: Awake and oriented X 3, normal affect, Insight and Judgment appropriate.    Darrol Jump, NP 11:13 AM [facility]

## 2022-04-02 NOTE — Patient Instructions (Signed)

## 2022-04-03 LAB — IRON,TIBC AND FERRITIN PANEL
%SAT: 26 % (calc) (ref 16–45)
Ferritin: 56 ng/mL (ref 16–288)
Iron: 86 ug/dL (ref 45–160)
TIBC: 328 mcg/dL (calc) (ref 250–450)

## 2022-04-03 LAB — COMPLETE METABOLIC PANEL WITH GFR
AG Ratio: 1.4 (calc) (ref 1.0–2.5)
ALT: 10 U/L (ref 6–29)
AST: 13 U/L (ref 10–35)
Albumin: 4.6 g/dL (ref 3.6–5.1)
Alkaline phosphatase (APISO): 88 U/L (ref 37–153)
BUN/Creatinine Ratio: 20 (calc) (ref 6–22)
BUN: 38 mg/dL — ABNORMAL HIGH (ref 7–25)
CO2: 23 mmol/L (ref 20–32)
Calcium: 10 mg/dL (ref 8.6–10.4)
Chloride: 106 mmol/L (ref 98–110)
Creat: 1.88 mg/dL — ABNORMAL HIGH (ref 0.60–0.95)
Globulin: 3.3 g/dL (calc) (ref 1.9–3.7)
Glucose, Bld: 142 mg/dL — ABNORMAL HIGH (ref 65–99)
Potassium: 5 mmol/L (ref 3.5–5.3)
Sodium: 141 mmol/L (ref 135–146)
Total Bilirubin: 0.3 mg/dL (ref 0.2–1.2)
Total Protein: 7.9 g/dL (ref 6.1–8.1)
eGFR: 24 mL/min/{1.73_m2} — ABNORMAL LOW (ref >=60)

## 2022-04-03 LAB — CBC WITH DIFFERENTIAL/PLATELET
Absolute Monocytes: 817 cells/uL (ref 200–950)
Basophils Absolute: 38 cells/uL (ref 0–200)
Basophils Relative: 0.4 %
Eosinophils Absolute: 105 cells/uL (ref 15–500)
Eosinophils Relative: 1.1 %
HCT: 35.1 % (ref 35.0–45.0)
Hemoglobin: 11.5 g/dL — ABNORMAL LOW (ref 11.7–15.5)
Lymphs Abs: 2641 cells/uL (ref 850–3900)
MCH: 30.3 pg (ref 27.0–33.0)
MCHC: 32.8 g/dL (ref 32.0–36.0)
MCV: 92.6 fL (ref 80.0–100.0)
MPV: 11.6 fL (ref 7.5–12.5)
Monocytes Relative: 8.6 %
Neutro Abs: 5900 cells/uL (ref 1500–7800)
Neutrophils Relative %: 62.1 %
Platelets: 245 10*3/uL (ref 140–400)
RBC: 3.79 10*6/uL — ABNORMAL LOW (ref 3.80–5.10)
RDW: 12.6 % (ref 11.0–15.0)
Total Lymphocyte: 27.8 %
WBC: 9.5 10*3/uL (ref 3.8–10.8)

## 2022-04-03 LAB — VITAMIN D 25 HYDROXY (VIT D DEFICIENCY, FRACTURES): Vit D, 25-Hydroxy: 76 ng/mL (ref 30–100)

## 2022-04-07 NOTE — Progress Notes (Signed)
Patient's daughter in aware of lab results. -e welch

## 2022-04-08 ENCOUNTER — Other Ambulatory Visit: Payer: Self-pay

## 2022-04-08 ENCOUNTER — Ambulatory Visit: Payer: Medicare Other | Admitting: Internal Medicine

## 2022-04-08 ENCOUNTER — Encounter: Payer: Self-pay | Admitting: Internal Medicine

## 2022-04-08 ENCOUNTER — Other Ambulatory Visit (HOSPITAL_COMMUNITY): Payer: Self-pay

## 2022-04-08 DIAGNOSIS — M8668 Other chronic osteomyelitis, other site: Secondary | ICD-10-CM | POA: Diagnosis not present

## 2022-04-08 DIAGNOSIS — H6690 Otitis media, unspecified, unspecified ear: Secondary | ICD-10-CM | POA: Insufficient documentation

## 2022-04-08 DIAGNOSIS — H663X2 Other chronic suppurative otitis media, left ear: Secondary | ICD-10-CM

## 2022-04-08 DIAGNOSIS — H7092 Unspecified mastoiditis, left ear: Secondary | ICD-10-CM

## 2022-04-08 MED ORDER — CIPROFLOXACIN HCL 250 MG PO TABS: 250.0000 mg | ORAL_TABLET | Freq: Every day | ORAL | 0 refills | Status: DC

## 2022-04-08 MED ORDER — METRONIDAZOLE 500 MG PO TABS: 500.0000 mg | ORAL_TABLET | Freq: Three times a day (TID) | ORAL | 0 refills | Status: DC

## 2022-04-08 MED ORDER — LINEZOLID 600 MG PO TABS: 600.0000 mg | ORAL_TABLET | Freq: Two times a day (BID) | ORAL | 0 refills | Status: DC

## 2022-04-08 NOTE — Progress Notes (Signed)
[name] for Infectious Disease  Reason for Consult: Chronic otitis media of the left ear complicated by mastoiditis and petrous temporal bone osteomyelitis Referring Provider: Dr. Dayle Points  Assessment: Jeanette Espinoza has a left external canal polyp that is now complicated by chronic otitis media, extensive mastoiditis and temporal bone osteomyelitis.  Not certain that her recent culture of the polyp biopsy accurately represents what may be causing the osteomyelitis given that she has been on chronic antibiotic ear drops.  She tells me that Dr. Suszanne Finch initially wanted to coordinate treatment with the infectious disease team at [facility].  However [place] lives here in [place] at [place] and preferred to get her infectious disease care locally.  I talked to Audia and her daughter about the serious nature of this infection and outlined potential antibiotic treatment options including PICC placement followed by a long course of IV antibiotics versus oral antibiotic therapy with close follow-up.  She is very wary about PICC placement and IV antibiotics.  She is aware of the potential risk of a PICC line and this would require at least a temporary move to the rehab unit at [name].  I did talk with Dr. Suszanne Finch today after [name]'s visit.  He agrees with me that the biopsy cultures may not represent the full breath of pathogens causing mastoiditis and osteomyelitis.  We agreed that it is not worth pursuing further cultures at this time to treat with empiric antibiotic therapy, monitor clinical response, inflammatory markers and follow-up imaging in about 1 month.  I spoke with [name], [name], again today after her visit.  We agree that IV antibiotics would be difficult for her so I will start her on oral linezolid, ciprofloxacin and metronidazole.  I will arrange phone follow-up with them early next week to see how she is tolerating her new antibiotic regimen.  She  will need close follow-up and blood work within the next 2 weeks.  Plan: Start oral linezolid, ciprofloxacin and metronidazole Phone follow-up early next week  Patient Active Problem List   Diagnosis Date Noted   Hard of hearing 12/16/2021   Sedentary lifestyle 12/11/2020   At moderate risk for fall 12/11/2020   Type 2 diabetes mellitus with stage 4 chronic kidney disease, without long-term current use of insulin ([place]) 01/23/2020   Hyperlipidemia associated with type 2 diabetes mellitus ([place]) 01/23/2020   Pain due to onychomycosis of toenails of both feet 06/09/2019   Senile purpura ([place]) 09/16/2018   ASHD (arteriosclerotic heart disease) 09/14/2018   CKD stage 4 due to type 2 diabetes mellitus ([place]) 11/03/2017   Essential hypertension 01/02/2016   Medication management 02/06/2014   Diet-controlled diabetes mellitus ([place]) 02/06/2014   Vitamin D deficiency    OA (osteoarthritis) of hip 12/02/2012    Patient's Medications  New Prescriptions   No medications on file  Previous Medications   AMLODIPINE (NORVASC) 5 MG TABLET    Take 5 mg by mouth daily. At 12 pm   ASCORBIC ACID (VITAMIN C) 1000 MG TABLET    Take 1,000 mg by mouth daily.   CIPROFLOXACIN-DEXAMETHASONE (CIPRODEX) OTIC SUSPENSION    INSTILL 4 DROPS INTO LEFT EAR TWICE DAILY FOR 10 DAYS   DOXYCYCLINE (VIBRAMYCIN) 50 MG CAPSULE    Take 1 capsule (50 mg total) by mouth 2 (two) times daily.   SACCHAROMYCES BOULARDII (FLORASTOR) 250 MG CAPSULE    Take 1 capsule (250 mg total) by mouth 2 (two) times daily.   TRIAMCINOLONE OINTMENT (  KENALOG) 0.1 %    APPLY 1 APPLICATION TOPICALLY 2 TIMES DAILY  Modified Medications   No medications on file  Discontinued Medications   AMLODIPINE (NORVASC) 5 MG TABLET    Take  1 tablet  Daily  for BP                                /                 Take 1 tablet by mouth once daily   OFLOXACIN (FLOXIN) 0.3 % OTIC SOLUTION    Place 5 drops into the left ear daily. 7 days    HPI: Jeanette Espinoza  (pronounced "addicts) is a 88 y.o. female who began to notice hearing loss in her left ear and a clicking sound last fall.  She was eventually evaluated by her ear nose and throat specialist, Dr. Benjamine Mola, in December.  She was found to have a large left external canal polyp occluding her canal with some mucopurulent drainage.  She was treated with some antibiotic eardrops and and transient improvement.  She underwent some debridement and March of this year and was put back on a different topical antibiotic but did not have much improvement.  She underwent a CT scan on 02/27/2022 which revealed:  ADDENDUM: Study discussed by telephone with Dr. Leta Baptist on 02/27/2022 at 11:11 .   I discussed the left temporal bone findings with Dr. Benjamine Mola, including the left mastoid erosion at the sigmoid plate.   He advises that the patient has had a chronic left ear infection, not responding to antibiotics.   And furthermore, he advised on the phone that the opacification of the left EAC clinically is due to an obstructing "polyp", which was indeterminate on physical inspection although he took some fragments of the lesion in his office and might be able to sent for histology.   Although the majority of the left EAC is intact, I do now identify one suspicious area of erosion along the anterior inferior mastoid (series 3, image 42) opposite the sigmoid plate erosion, which could be in continuity with the posteroinferior margin of the "polyp" (opacified EAC).  She was referred to Dr. Suszanne Finch to get an initial evaluation on 03/10/2022.  He again confirmed the large polyp in her left canal with some purulent drainage.  She had significant hearing loss on the left side.  He did a biopsy of the polyp.  Path showed reactive squamous epithelium with granulation and mixed acute on chronic inflammation.  Gram stain of the biopsy showed gram-positive rods and gram-positive cocci.  Cultures grew coagulase-negative staph and a  corynebacterium species.  No antibiotic susceptibilities were performed.  She had been using the antibiotic eardrops at the time of the biopsy.  She underwent an MRI on 03/30/2021 which revealed:   1.  Findings are concerning for extensive active left otitis externa, otitis media, mastoiditis, and surrounding petrous temporal bone osteomyelitis with inflammatory polypoid soft tissue occluding the left external auditory canal. No underlying neoplasm is identified.   2.  While there is multifocal reactive dural thickening and enhancement present along the petrous ridge of left temporal bone, there is no evidence of cerebritis, epidural empyema, or cerebral abscess at this time.   3.  No evidence of osteomyelitis extending into the petrous apex or evidence of petrous apicitis at this time.   4.  While the left transverse and sigmoid  dural venous sinuses are likely patent, nonocclusive luminal thrombus is within the differential given the adjacent infection, and CT venogram should be considered to evaluate for dural venous sinus patency if clinically indicated.   5.  There is no focal lesion demonstrating restricted diffusion to suggest cholesteatoma.   On repeat evaluation on 03/31/2022 Dr.  Suszanne Finch noted decreased purulence in the left ear.  She was referred here for urgent infectious disease evaluation.  The only systemic antibiotic therapy she has received throughout this acute illness was a 1 week course of doxycycline in early spring without much improvement  Review of Systems: Review of Systems  Constitutional:  Negative for chills, diaphoresis and fever.  HENT:  Positive for ear discharge and hearing loss. Negative for ear pain and tinnitus.   Musculoskeletal:  Positive for falls.       She had a recent fall last weekend.  She turned off the light in her bedroom and was using her walker to go to bed.  She fell to the floor and hit her right knee and the left side of her head on the floor but  was able to get up and called nursing.  She did not appear to have significant injury and did not want to be evaluated further at that time.  Neurological:  Positive for headaches.     Past Medical History:  Diagnosis Date   Arthritis    HLD (hyperlipidemia)    HTN (hypertension)    echo (11/11) with EF 55-60%, mild LV hypertrophy, PA systolic pressure 34 mmHg   Low back pain    Pre-diabetes    diet controlled   Unspecified vitamin D deficiency     Social History   Tobacco Use   Smoking status: Former    Types: Cigarettes    Quit date: 11/09/1970    Years since quitting: 51.4   Smokeless tobacco: Never  Substance Use Topics   Alcohol use: Yes    Comment: occasional    Drug use: No    Family History  Problem Relation Age of Onset   Heart disease Mother    Diabetes Other        DM - sibiling    Coronary artery disease Neg Hx        no premature CAd    No Known Allergies  OBJECTIVE: Vitals:   04/08/22 1002  Weight: 119 lb (54 kg)  Height: 4\' 11"  (1.499 m)   Body mass index is 24.04 kg/m.   Physical Exam Constitutional:      General: She is not in acute distress.    Comments: She is in no acute distress.  She is accompanied by her daughter, [name].  She is very hard of hearing.  HENT:     Head:     Comments: She has a tender knot on her left posterior scalp where she hit her head.  She has no tenderness, redness or swelling over her left mastoid.  Her external ear appears normal.  Her left canal is occluded and I cannot visualize her tympanic membrane.  There is no significant drainage. Cardiovascular:     Rate and Rhythm: Normal rate.  Pulmonary:     Effort: Pulmonary effort is normal.  Neurological:     Mental Status: She is alert.     Cranial Nerves: No cranial nerve deficit.  Psychiatric:        Mood and Affect: Mood normal.   Labs 03/31/2022 Sedimentation rate elevated at 81 C-reactive protein  elevated at 14.1  Microbiology: No results found for  this or any previous visit (from the past 240 hour(s)).  Michel Bickers, MD [facility] [phone] pager   [phone] cell 04/08/2022, 10:07 AM

## 2022-04-13 ENCOUNTER — Encounter: Payer: Self-pay | Admitting: Internal Medicine

## 2022-04-14 ENCOUNTER — Telehealth: Payer: Medicare Other | Admitting: Internal Medicine

## 2022-04-14 NOTE — Telephone Encounter (Signed)
Received call from [place] at [facility] wanting to clarify medication orders. She says the daughter wanted to stop metronidazole, advised that per Dr. Traquan Duarte Salon okay to stop the metronidazole. Orders repeated and verified.    Beryle Flock, RN

## 2022-04-15 ENCOUNTER — Encounter: Payer: Self-pay | Admitting: Internal Medicine

## 2022-04-15 ENCOUNTER — Other Ambulatory Visit: Payer: Self-pay

## 2022-04-15 ENCOUNTER — Ambulatory Visit (INDEPENDENT_AMBULATORY_CARE_PROVIDER_SITE_OTHER): Payer: Medicare Other | Admitting: Internal Medicine

## 2022-04-15 DIAGNOSIS — M8668 Other chronic osteomyelitis, other site: Secondary | ICD-10-CM

## 2022-04-15 NOTE — Progress Notes (Signed)
Faxed today's office note to Dr. Lyndel Safe at [facility] at [phone].  Beryle Flock, RN

## 2022-04-15 NOTE — Telephone Encounter (Signed)
Spoke with patient's daughter [name], who requested that I call [facility] to confirm that they had the correct orders regarding the patient's metronidazole.  Called and spoke with Jenny Reichmann, Therapist, sports at [facility]. Reiterated that the order was to stop metronidazole. Jenny Reichmann stated understanding, and that it had already been stopped. No additional questions.  Binnie Kand, RN

## 2022-04-15 NOTE — Progress Notes (Signed)
Virtual Visit via Telephone Note  I connected with Jeanette Espinoza on 04/15/22 at  8:45 AM EDT by telephone and verified that I am speaking with the correct person using two identifiers.  Location: Patient: [facility] Provider: [facility]   I discussed the limitations, risks, security and privacy concerns of performing an evaluation and management service by telephone and the availability of in person appointments. I also discussed with the patient that there may be a patient responsible charge related to this service. The patient expressed understanding and agreed to proceed.   History of Present Illness: I called and spoke with Jeanette Espinoza and her daughter, Jeanette Espinoza began to notice hearing loss in her left ear and a clicking sound last fall.  She was eventually evaluated by her ear nose and throat specialist, Dr. Benjamine Mola, in December.  She was found to have a large left external canal polyp occluding her canal with some mucopurulent drainage.  She was treated with some antibiotic eardrops and and transient improvement.  She underwent some debridement and March of this year and was put back on a different topical antibiotic but did not have much improvement.   She underwent a CT scan on 02/27/2022 which revealed:   ADDENDUM: Study discussed by telephone with Dr. Leta Baptist on 02/27/2022 at 11:11 .   I discussed the left temporal bone findings with Dr. Benjamine Mola, including the left mastoid erosion at the sigmoid plate.   He advises that the patient has had a chronic left ear infection, not responding to antibiotics.   And furthermore, he advised on the phone that the opacification of the left EAC clinically is due to an obstructing "polyp", which was indeterminate on physical inspection although he took some fragments of the lesion in his office and might be able to sent for histology.   Although the majority of the left EAC is intact, I do now identify one suspicious area of erosion along the  anterior inferior mastoid (series 3, image 42) opposite the sigmoid plate erosion, which could be in continuity with the posteroinferior margin of the "polyp" (opacified EAC).   She was referred to Dr. Suszanne Finch to get an initial evaluation on 03/10/2022.  He again confirmed the large polyp in her left canal with some purulent drainage.  She had significant hearing loss on the left side.  He did a biopsy of the polyp.  Path showed reactive squamous epithelium with granulation and mixed acute on chronic inflammation.  Gram stain of the biopsy showed gram-positive rods and gram-positive cocci.  Cultures grew coagulase-negative staph and a corynebacterium species.  No antibiotic susceptibilities were performed.  She had been using the antibiotic eardrops at the time of the biopsy.  She underwent an MRI on 03/30/2021 which revealed:    1.  Findings are concerning for extensive active left otitis externa, otitis media, mastoiditis, and surrounding petrous temporal bone osteomyelitis with inflammatory polypoid soft tissue occluding the left external auditory canal. No underlying neoplasm is identified.   2.  While there is multifocal reactive dural thickening and enhancement present along the petrous ridge of left temporal bone, there is no evidence of cerebritis, epidural empyema, or cerebral abscess at this time.   3.  No evidence of osteomyelitis extending into the petrous apex or evidence of petrous apicitis at this time.   4.  While the left transverse and sigmoid dural venous sinuses are likely patent, nonocclusive luminal thrombus is within the differential given the adjacent infection, and CT  venogram should be considered to evaluate for dural venous sinus patency if clinically indicated.   5.  There is no focal lesion demonstrating restricted diffusion to suggest cholesteatoma.    On repeat evaluation on 03/31/2022 Dr.  Suszanne Finch noted decreased purulence in the left ear.  She was referred here for  urgent infectious disease evaluation.  The only systemic antibiotic therapy she has received throughout this acute illness was a 1 week course of doxycycline in early spring without much improvement.  When I saw her for the first time last week I discussed management options including PICC placement followed by intravenous antibiotics versus oral antibiotic therapy.  She chose the latter option and I started her on ciprofloxacin, linezolid and metronidazole.  Shortly after starting this regimen she developed diarrhea and severe nausea.  She was moved to the rehab unit at [facility]  I asked her to stop metronidazole 2 days ago.  Unfortunately she was inadvertently given 1 more dose yesterday morning.  [name] thinks that she is feeling better since stopping regular doses of metronidazole.   Observations/Objective:   Assessment and Plan: I suspect metronidazole is the culprit for her acute diarrhea and nausea.  Hopefully she will do better off of metronidazole.  I recommend continuing ciprofloxacin and linezolid alone for now in addition to her antibiotic eardrops.  I would recommend that she get a CBC weekly starting next week to see if she has any evidence of bone marrow suppression related to linezolid.  Follow Up Instructions: This continue metronidazole Continue ciprofloxacin and linezolid CBC weekly starting next week Continue eardrops Medicate for nausea as needed I have arranged phone follow-up on Tuesday, 04/21/2022   I discussed the assessment and treatment plan with the patient. The patient was provided an opportunity to ask questions and all were answered. The patient agreed with the plan and demonstrated an understanding of the instructions.   The patient was advised to call back or seek an in-person evaluation if the symptoms worsen or if the condition fails to improve as anticipated.  I provided 16 minutes of non-face-to-face time during this encounter.   Michel Bickers, MD

## 2022-04-16 ENCOUNTER — Non-Acute Institutional Stay (SKILLED_NURSING_FACILITY): Payer: Medicare Other | Admitting: Adult Health

## 2022-04-16 ENCOUNTER — Encounter: Payer: Self-pay | Admitting: Adult Health

## 2022-04-16 DIAGNOSIS — I10 Essential (primary) hypertension: Secondary | ICD-10-CM

## 2022-04-16 DIAGNOSIS — E785 Hyperlipidemia, unspecified: Secondary | ICD-10-CM

## 2022-04-16 DIAGNOSIS — E1169 Type 2 diabetes mellitus with other specified complication: Secondary | ICD-10-CM

## 2022-04-16 DIAGNOSIS — K521 Toxic gastroenteritis and colitis: Secondary | ICD-10-CM

## 2022-04-16 DIAGNOSIS — M8668 Other chronic osteomyelitis, other site: Secondary | ICD-10-CM | POA: Diagnosis not present

## 2022-04-16 DIAGNOSIS — R531 Weakness: Secondary | ICD-10-CM

## 2022-04-16 DIAGNOSIS — E1122 Type 2 diabetes mellitus with diabetic chronic kidney disease: Secondary | ICD-10-CM

## 2022-04-16 DIAGNOSIS — N184 Chronic kidney disease, stage 4 (severe): Secondary | ICD-10-CM

## 2022-04-16 NOTE — Progress Notes (Signed)
Location:  [place] Room Number: 157-A Place of Service:  SNF [phone]) Provider:  Royal Hawthorn, NP   Patient Care Team: Unk Pinto, MD as PCP - General (Internal Medicine) Gaynelle Arabian, MD as Consulting Physician (Orthopedic Surgery) Harrie Foreman, [place] as Referring Physician (Optometry) Teena Irani, MD (Inactive) as Consulting Physician (Gastroenterology) Suella Broad, MD as Consulting Physician (Physical Medicine and Rehabilitation) Almyra Deforest, [place] as Consulting Physician (Cardiology) Leta Baptist, MD as Consulting Physician (Otolaryngology) Elisha Ponder, MD as Resident (Otolaryngology)  Extended Emergency Contact Information Primary Emergency Contact: Neva Seat [place] Phone: [phone] Mobile Phone: [phone] Relation: Daughter Secondary Emergency Contact: Cristela Blue [place] Mobile Phone: [phone] Relation: Son  Code Status:  Full Code  Goals of care: Advanced Directive information    04/16/2022   10:45 AM  Advanced Directives  Does Patient Have a Medical Advance Directive? Yes  Type of Paramedic of [place];Living will  Does patient want to make changes to medical advance directive? No - Patient declined  Copy of [place] in Chart? Yes - validated most recent copy scanned in chart (See row information)     Chief Complaint  Patient presents with   Acute Visit    Left ear concerns and bone infection     HPI:  Pt is a 88 y.o. female seen today for an acute visit for admission to rehab after a left ear and bone infection.   She was admitted to rehab due to some weakness and GI upset while on antibiotics for the above aforementioned issue.   Of note from Dr. Hale Bogus note she was seen by ENT Dr Benjamine Mola and found to have a left ear canal polyp, with infection. She was treated with antibiotics and did not respond. Later had a  debridement and did not improve. Had CT in April showing erosion at the mastoid plate. Later had a bx of the polyp and the path showed reactive squamous epithelium with granulation and mixed acute on chronic inflammation. Cultures grew coagulase-negative staph and a corynebacterium species. Then in May she had an MRI which showed active left otitis externa, mastoiditis, osteomyelitis, and inflammation.   She is currently being treated with Cipro and Zyvox per ID, flagyl was stopped due to nausea, diarrhea. She is feeling better today. Denies nausea, denies pain. Does have hearing loss.   Past Medical History:  Diagnosis Date   Arthritis    HLD (hyperlipidemia)    HTN (hypertension)    echo (11/11) with EF 55-60%, mild LV hypertrophy, PA systolic pressure 34 mmHg   Low back pain    Pre-diabetes    diet controlled   Unspecified vitamin D deficiency    Past Surgical History:  Procedure Laterality Date   ABDOMINAL HYSTERECTOMY     partial   abnormal EKG     BACK SURGERY     cataract surgery     colonoscopsy  2008   Neg  Dr Amedeo Plenty   EYE SURGERY Bilateral    cataract   JOINT REPLACEMENT     left hip replacement , right knee replacement    lexiscan myoview  11/11   EF 81%, normal perfusion images suggesting no ischemia or infarction   SPINE SURGERY     lumbar   surgery for DDD     TOTAL HIP ARTHROPLASTY  12/02/2012   Procedure: TOTAL HIP ARTHROPLASTY;  Surgeon: Gearlean Alf, MD;  Location: [facility];  Service: Orthopedics;  Laterality: Right;    No Known Allergies  Outpatient Encounter Medications as of 04/16/2022  Medication Sig   acetaminophen (TYLENOL) 325 MG tablet Take 650 mg by mouth every 4 (four) hours as needed.   amLODipine (NORVASC) 5 MG tablet Take 5 mg by mouth daily. At 12 pm   ciprofloxacin (CIPRO) 250 MG tablet Take 1 tablet (250 mg total) by mouth daily.   ciprofloxacin-dexamethasone (CIPRODEX) OTIC suspension Place 4 drops into both ears every morning.   linezolid  (ZYVOX) 600 MG tablet Take 1 tablet (600 mg total) by mouth 2 (two) times daily.   saccharomyces boulardii (FLORASTOR) 250 MG capsule Take 1 capsule (250 mg total) by mouth 2 (two) times daily.   [DISCONTINUED] Ascorbic Acid (VITAMIN C) 1000 MG tablet Take 1,000 mg by mouth daily.   [DISCONTINUED] ciprofloxacin-dexamethasone (CIPRODEX) OTIC suspension INSTILL 4 DROPS INTO LEFT EAR TWICE DAILY FOR 10 DAYS   [DISCONTINUED] triamcinolone ointment (KENALOG) 0.1 % APPLY 1 APPLICATION TOPICALLY 2 TIMES DAILY   No facility-administered encounter medications on file as of 04/16/2022.    Review of Systems  Constitutional:  Negative for activity change, appetite change, chills, diaphoresis, fatigue, fever and unexpected weight change.  HENT:  Positive for ear discharge and hearing loss. Negative for congestion, ear pain, mouth sores, postnasal drip, rhinorrhea, sinus pressure and sinus pain.   Respiratory:  Negative for cough, shortness of breath and wheezing.   Cardiovascular:  Negative for chest pain, palpitations and leg swelling.  Gastrointestinal:  Positive for diarrhea (improved) and nausea (improved). Negative for abdominal distention, abdominal pain and constipation.  Genitourinary:  Negative for difficulty urinating and dysuria.  Musculoskeletal:  Positive for gait problem. Negative for arthralgias, back pain, joint swelling and myalgias.  Neurological:  Positive for weakness. Negative for dizziness, tremors, seizures, syncope, facial asymmetry, speech difficulty, light-headedness, numbness and headaches.  Psychiatric/Behavioral:  Negative for agitation, behavioral problems and confusion.     Immunization History  Administered Date(s) Administered   Influenza Split 10/11/2013   Influenza, High Dose Seasonal PF 09/18/2014, 09/20/2015, 08/04/2017, 09/16/2018, 10/16/2019, 08/15/2021   Influenza, Seasonal, Injecte, Preservative Fre 10/15/2016   Influenza-Unspecified 10/10/2012, 08/09/2020    Moderna Sars-Covid-2 Vaccination 11/27/2019, 12/28/2019, 09/19/2020   Pneumococcal Conjugate-13 12/19/2014   Pneumococcal Polysaccharide-23 10/11/2013   Tdap 09/21/2012   Pertinent  Health Maintenance Due  Topic Date Due   FOOT EXAM  05/14/2022   INFLUENZA VACCINE  06/09/2022   HEMOGLOBIN A1C  06/11/2022   URINE MICROALBUMIN  08/15/2022   OPHTHALMOLOGY EXAM  10/09/2022   DEXA SCAN  Discontinued      12/11/2020   11:57 AM 05/14/2021   10:07 PM 12/12/2021   11:01 AM 04/08/2022   10:07 AM 04/15/2022    8:33 AM  Fall Risk  Falls in the past year? 0 0 0 1 1  Was there an injury with Fall? 0  0 1 1  Fall Risk Category Calculator 0  0 2 2  Fall Risk Category Low  Low Moderate Moderate  Patient Fall Risk Level Moderate fall risk  Moderate fall risk  High fall risk  Patient at Risk for Falls Due to Impaired mobility;Impaired balance/gait;History of fall(s) No Fall Risks History of fall(s);Impaired mobility;Impaired balance/gait History of fall(s) History of fall(s);Impaired balance/gait;Impaired mobility  Fall risk Follow up Falls evaluation completed;Education provided;Falls prevention discussed Falls evaluation completed;Education provided;Falls prevention discussed Falls prevention discussed;Falls evaluation completed;Education provided Falls evaluation completed Falls evaluation completed  Fall risk Follow up - Comments  Declines PT this visit     Functional Status Survey:    Vitals:   04/16/22 1038  BP: 115/69  Resp: 14  Temp: 98.7 F (37.1 C)  TempSrc: Temporal  SpO2: 96%  Weight: 116 lb 3.2 oz (52.7 kg)  Height: 4\' 11"  (1.499 m)   Body mass index is 23.47 kg/m. Physical Exam Vitals and nursing note reviewed.  Constitutional:      General: She is not in acute distress.    Appearance: She is not diaphoretic.  HENT:     Head: Normocephalic and atraumatic.     Right Ear: Tympanic membrane, ear canal and external ear normal.     Ears:     Comments: Left ear canal with yellow  drainage and brown drainage. No erythema or ear canal swelling. No pain with palpation     Nose: Nose normal.     Mouth/Throat:     Mouth: Mucous membranes are moist.     Pharynx: Oropharynx is clear.  Eyes:     Conjunctiva/sclera: Conjunctivae normal.     Pupils: Pupils are equal, round, and reactive to light.  Neck:     Vascular: No JVD.  Cardiovascular:     Rate and Rhythm: Normal rate and regular rhythm.     Heart sounds: No murmur heard. Pulmonary:     Effort: Pulmonary effort is normal. No respiratory distress.     Breath sounds: Normal breath sounds. No wheezing.  Abdominal:     General: Abdomen is flat. Bowel sounds are normal.     Palpations: Abdomen is soft.  Musculoskeletal:     Cervical back: No rigidity or tenderness.  Skin:    General: Skin is warm and dry.  Neurological:     Mental Status: She is alert and oriented to person, place, and time.  Psychiatric:        Mood and Affect: Mood normal.     Labs reviewed: Recent Labs    05/15/21 1123 08/15/21 1053 12/12/21 1128 01/07/22 1626 04/02/22 1151  NA 140   < > 138 135 141  K 4.7   < > 5.3 5.0 5.0  CL 104   < > 101 101 106  CO2 25   < > 28 21 23   GLUCOSE 139*   < > 142* 156* 142*  BUN 30*   < > 34* 39* 38*  CREATININE 1.90*   < > 2.31* 2.01* 1.88*  CALCIUM 10.1   < > 9.9 9.7 10.0  MG 2.7*  --  3.1*  --   --    < > = values in this interval not displayed.   Recent Labs    12/12/21 1128 01/07/22 1626 04/02/22 1151  AST 14 12 13   ALT 9 11 10   BILITOT 0.4 0.3 0.3  PROT 7.5 7.5 7.9   Recent Labs    12/12/21 1128 01/07/22 1626 04/02/22 1151  WBC 9.0 13.1* 9.5  NEUTROABS 4,995 8,764* 5,900  HGB 11.5* 10.6* 11.5*  HCT 34.7* 31.6* 35.1  MCV 93.8 92.9 92.6  PLT 198 358 245   Lab Results  Component Value Date   TSH 2.22 12/12/2021   Lab Results  Component Value Date   HGBA1C 6.5 (H) 12/12/2021   Lab Results  Component Value Date   CHOL 307 (H) 08/15/2021   HDL 49 (L) 08/15/2021    LDLCALC 198 (H) 08/15/2021   TRIG 356 (H) 08/15/2021   CHOLHDL 6.3 (H) 08/15/2021    Significant Diagnostic Results in  last 30 days:  No results found.  Assessment/Plan  1. Chronic osteomyelitis of temporal bone (HCC) Followed by ID on Cipro, Ciprodex, and Zyvox [facility] at [facility] for assistance and therapy   2. Weakness PT OT eval tx   3. Diarrhea due to drug Improved   4. Essential hypertension Controlled on Norvasc   5. Hyperlipidemia associated with type 2 diabetes mellitus ([place]) Lab Results  Component Value Date   LDLCALC 198 (H) 08/15/2021   Not currently on meds.   6. Type 2 diabetes mellitus with stage 4 chronic kidney disease, without long-term current use of insulin (HCC) Lab Results  Component Value Date   HGBA1C 6.5 (H) 12/12/2021  Diet controlled  Fasting glucose 142  7. CKD stage 4 due to type 2 diabetes mellitus ([place]) Lab Results  Component Value Date   BUN 38 (H) 04/02/2022   Lab Results  Component Value Date   CREATININE 1.88 (H) 04/02/2022   Estimated Creatinine Clearance: 13.6 mL/min (A) (by C-G formula based on SCr of 1.88 mg/dL (H)).  Continue to periodically monitor BMP and avoid nephrotoxic agents Renal dosing on meds   Labs/tests ordered:  CBC weekly

## 2022-04-20 ENCOUNTER — Encounter: Payer: Self-pay | Admitting: Internal Medicine

## 2022-04-20 ENCOUNTER — Non-Acute Institutional Stay (SKILLED_NURSING_FACILITY): Payer: Medicare Other | Admitting: Internal Medicine

## 2022-04-20 DIAGNOSIS — I10 Essential (primary) hypertension: Secondary | ICD-10-CM | POA: Diagnosis not present

## 2022-04-20 DIAGNOSIS — E1169 Type 2 diabetes mellitus with other specified complication: Secondary | ICD-10-CM | POA: Diagnosis not present

## 2022-04-20 DIAGNOSIS — E1122 Type 2 diabetes mellitus with diabetic chronic kidney disease: Secondary | ICD-10-CM

## 2022-04-20 DIAGNOSIS — M8668 Other chronic osteomyelitis, other site: Secondary | ICD-10-CM | POA: Diagnosis not present

## 2022-04-20 DIAGNOSIS — R531 Weakness: Secondary | ICD-10-CM

## 2022-04-20 DIAGNOSIS — E785 Hyperlipidemia, unspecified: Secondary | ICD-10-CM

## 2022-04-20 DIAGNOSIS — N184 Chronic kidney disease, stage 4 (severe): Secondary | ICD-10-CM

## 2022-04-20 NOTE — Progress Notes (Signed)
Provider:  Veleta Miners MD  Location:    [facility] Room Number: 706 Place of Service:  SNF (31)  PCP: Unk Pinto, MD Patient Care Team: Unk Pinto, MD as PCP - General (Internal Medicine) Gaynelle Arabian, MD as Consulting Physician (Orthopedic Surgery) Harrie Foreman, [place] as Referring Physician (Optometry) Teena Irani, MD (Inactive) as Consulting Physician (Gastroenterology) Suella Broad, MD as Consulting Physician (Physical Medicine and Rehabilitation) Almyra Deforest, [place] as Consulting Physician (Cardiology) Leta Baptist, MD as Consulting Physician (Otolaryngology) Elisha Ponder, MD as Resident (Otolaryngology)  Extended Emergency Contact Information Primary Emergency Contact: Neva Seat [place] of [place] Phone: [phone] Mobile Phone: [phone] Relation: Daughter Secondary Emergency Contact: Cristela Blue [place] of [place] Mobile Phone: [phone] Relation: Son  Code Status: Full Code Goals of Care: Advanced Directive information    04/20/2022   11:33 AM  Advanced Directives  Does Patient Have a Medical Advance Directive? Yes  Type of Paramedic of [place];Living will  Does patient want to make changes to medical advance directive? No - Patient declined  Copy of [place] in Chart? Yes - validated most recent copy scanned in chart (See row information)      Chief Complaint  Patient presents with   New Admit To SNF    Admission to SNF    HPI: Patient is a 88 y.o. female seen today for admission to SNF for Rehab  Patient has h/o Hypertension, HLD, Vit D def, Type 2 diabetes, CKD stage 4    Was seen by Dr Benjamine Mola for Left ottitis externa ,Hearing Loss  and Ear ache and Ear discharge Found to have Ear polyp with Infection Left ear biopsy Reactive Epithelium CT scan showed Erosion at mastoid place. Culture showed Coagulase Staph and  Corynebactrium MRI active left otitis externa, otitis media, mastoiditis, and surrounding petrous temporal bone osteomyelitis Now Seeing Infectious Disease. On Empirical Antibiotics Did not  tolerate Oral Metronidazole On Oral Cipro and Cipro ear drops and Oral Linezolid Doing well No Complains Needing assistant  with ADLS as weak but not dizzy. No Ear pain or discharge Past Medical History:  Diagnosis Date   Arthritis    HLD (hyperlipidemia)    HTN (hypertension)    echo (11/11) with EF 55-60%, mild LV hypertrophy, PA systolic pressure 34 mmHg   Low back pain    Pre-diabetes    diet controlled   Unspecified vitamin D deficiency    Past Surgical History:  Procedure Laterality Date   ABDOMINAL HYSTERECTOMY     partial   abnormal EKG     BACK SURGERY     cataract surgery     colonoscopsy  2008   Neg  Dr Amedeo Plenty   EYE SURGERY Bilateral    cataract   JOINT REPLACEMENT     left hip replacement , right knee replacement    lexiscan myoview  11/11   EF 81%, normal perfusion images suggesting no ischemia or infarction   SPINE SURGERY     lumbar   surgery for DDD     TOTAL HIP ARTHROPLASTY  12/02/2012   Procedure: TOTAL HIP ARTHROPLASTY;  Surgeon: Gearlean Alf, MD;  Location: [facility];  Service: Orthopedics;  Laterality: Right;    reports that she quit smoking about 51 years ago. Her smoking use included cigarettes. She has never used smokeless tobacco. She reports current alcohol use. She reports that she does not use drugs. Social History   Socioeconomic  History   Marital status: Single    Spouse name: Not on file   Number of children: Not on file   Years of education: Not on file   Highest education level: Not on file  Occupational History   Not on file  Tobacco Use   Smoking status: Former    Types: Cigarettes    Quit date: 11/09/1970    Years since quitting: 51.4   Smokeless tobacco: Never  Vaping Use   Vaping Use: Not on file  Substance and Sexual Activity    Alcohol use: Yes    Comment: occasional    Drug use: No   Sexual activity: Not on file  Other Topics Concern   Not on file  Social History Narrative   Retired, lives in [place]. Widowed   Daughter in [place]. [name] [phone])   Does not get regular exercise   Social Determinants of Health   Financial Resource Strain: Not on file  Food Insecurity: Not on file  Transportation Needs: Not on file  Physical Activity: Not on file  Stress: Not on file  Social Connections: Not on file  Intimate Partner Violence: Not on file    Functional Status Survey:    Family History  Problem Relation Age of Onset   Heart disease Mother    Diabetes Other        DM - sibiling    Coronary artery disease Neg Hx        no premature CAd     Health Maintenance  Topic Date Due   Zoster Vaccines- Shingrix (1 of 2) Never done   COVID-19 Vaccine (4 - Booster for Moderna series) 11/14/2020   FOOT EXAM  05/14/2022   INFLUENZA VACCINE  06/09/2022   HEMOGLOBIN A1C  06/11/2022   URINE MICROALBUMIN  08/15/2022   TETANUS/TDAP  09/21/2022   OPHTHALMOLOGY EXAM  10/09/2022   Pneumonia Vaccine 24+ Years old  Completed   HPV VACCINES  Aged Out   DEXA SCAN  Discontinued    No Known Allergies  Allergies as of 04/20/2022   No Known Allergies      Medication List        Accurate as of April 20, 2022 11:36 AM. If you have any questions, ask your nurse or doctor.          STOP taking these medications    acetaminophen 325 MG tablet Commonly known as: TYLENOL Stopped by: Virgie Dad, MD       TAKE these medications    amLODipine 5 MG tablet Commonly known as: NORVASC Take 5 mg by mouth daily. At 12 pm   ciprofloxacin 250 MG tablet Commonly known as: CIPRO Take 1 tablet (250 mg total) by mouth daily.   ciprofloxacin-dexamethasone OTIC suspension Commonly known as: CIPRODEX Place 4 drops into both ears every morning.   linezolid 600 MG tablet Commonly known as: ZYVOX Take 1 tablet  (600 mg total) by mouth 2 (two) times daily.   saccharomyces boulardii 250 MG capsule Commonly known as: Florastor Take 1 capsule (250 mg total) by mouth 2 (two) times daily.        Review of Systems  Constitutional:  Negative for activity change and appetite change.  HENT: Negative.    Eyes:  Negative for pain.  Respiratory:  Negative for cough and shortness of breath.   Cardiovascular:  Negative for leg swelling.  Gastrointestinal:  Negative for constipation.  Genitourinary: Negative.   Musculoskeletal:  Negative for arthralgias, gait problem and  myalgias.  Skin: Negative.   Neurological:  Positive for weakness. Negative for dizziness.  Psychiatric/Behavioral:  Negative for confusion, dysphoric mood and sleep disturbance.     Vitals:   04/20/22 1123  BP: 111/67  Pulse: 82  Resp: 19  Temp: 98.2 F (36.8 C)  SpO2: 100%  Weight: 116 lb 3.2 oz (52.7 kg)  Height: 4\' 11"  (1.499 m)   Body mass index is 23.47 kg/m. Physical Exam Vitals reviewed.  Constitutional:      Appearance: Normal appearance.  HENT:     Head: Normocephalic.     Nose: Nose normal.     Mouth/Throat:     Mouth: Mucous membranes are moist.     Pharynx: Oropharynx is clear.  Eyes:     Pupils: Pupils are equal, round, and reactive to light.  Cardiovascular:     Rate and Rhythm: Normal rate and regular rhythm.     Pulses: Normal pulses.     Heart sounds: Normal heart sounds. No murmur heard. Pulmonary:     Effort: Pulmonary effort is normal.     Breath sounds: Normal breath sounds.  Abdominal:     General: Abdomen is flat. Bowel sounds are normal.     Palpations: Abdomen is soft.  Musculoskeletal:        General: No swelling.     Cervical back: Neck supple.  Skin:    General: Skin is warm.  Neurological:     General: No focal deficit present.     Mental Status: She is alert and oriented to person, place, and time.  Psychiatric:        Mood and Affect: Mood normal.        Thought Content:  Thought content normal.     Labs reviewed: Basic Metabolic Panel: Recent Labs    05/15/21 1123 08/15/21 1053 12/12/21 1128 01/07/22 1626 04/02/22 1151  NA 140   < > 138 135 141  K 4.7   < > 5.3 5.0 5.0  CL 104   < > 101 101 106  CO2 25   < > 28 21 23   GLUCOSE 139*   < > 142* 156* 142*  BUN 30*   < > 34* 39* 38*  CREATININE 1.90*   < > 2.31* 2.01* 1.88*  CALCIUM 10.1   < > 9.9 9.7 10.0  MG 2.7*  --  3.1*  --   --    < > = values in this interval not displayed.   Liver Function Tests: Recent Labs    12/12/21 1128 01/07/22 1626 04/02/22 1151  AST 14 12 13   ALT 9 11 10   BILITOT 0.4 0.3 0.3  PROT 7.5 7.5 7.9   No results for input(s): "LIPASE", "AMYLASE" in the last 8760 hours. No results for input(s): "AMMONIA" in the last 8760 hours. CBC: Recent Labs    12/12/21 1128 01/07/22 1626 04/02/22 1151  WBC 9.0 13.1* 9.5  NEUTROABS 4,995 8,764* 5,900  HGB 11.5* 10.6* 11.5*  HCT 34.7* 31.6* 35.1  MCV 93.8 92.9 92.6  PLT 198 358 245   Cardiac Enzymes: No results for input(s): "CKTOTAL", "CKMB", "CKMBINDEX", "TROPONINI" in the last 8760 hours. BNP: Invalid input(s): "POCBNP" Lab Results  Component Value Date   HGBA1C 6.5 (H) 12/12/2021   Lab Results  Component Value Date   TSH 2.22 12/12/2021   No results found for: "VITAMINB12" No results found for: "FOLATE" Lab Results  Component Value Date   IRON 86 04/02/2022   TIBC 328 04/02/2022  FERRITIN 56 04/02/2022    Imaging and Procedures obtained prior to SNF admission: CT TEMPORAL BONES WO CONTRAST  Addendum Date: 02/27/2022   ADDENDUM REPORT: 02/27/2022 11:21 ADDENDUM: Study discussed by telephone with Dr. Leta Baptist on 02/27/2022 at 11:11 . I discussed the left temporal bone findings with Dr. Benjamine Mola, including the left mastoid erosion at the sigmoid plate. He advises that the patient has had a chronic left ear infection, not responding to antibiotics. And furthermore, he advised on the phone that the  opacification of the left EAC clinically is due to an obstructing "polyp", which was indeterminate on physical inspection although he took some fragments of the lesion in his office and might be able to sent for histology. Although the majority of the left EAC is intact, I do now identify one suspicious area of erosion along the anterior inferior mastoid (series 3, image 42) opposite the sigmoid plate erosion, which could be in continuity with the posteroinferior margin of the "polyp" (opacified EAC). Electronically Signed   By: Genevie Ann M.D.   On: 02/27/2022 11:21   Result Date: 02/27/2022 CLINICAL DATA:  88 year old female new onset hearing loss. EXAM: CT TEMPORAL BONES WITHOUT CONTRAST TECHNIQUE: Axial and coronal plane CT imaging of the petrous temporal bones was performed with thin-collimation image reconstruction. No intravenous contrast was administered. Multiplanar CT image reconstructions were also generated. RADIATION DOSE REDUCTION: This exam was performed according to the departmental dose-optimization program which includes automated exposure control, adjustment of the mA and/or kV according to patient size and/or use of iterative reconstruction technique. COMPARISON:  No pertinent prior exam. FINDINGS: RIGHT TEMPORAL BONE Mild debris in the right external auditory canal (series 9, image 83) which otherwise appears normal. Right tympanic membrane remains within normal limits. Right tympanic cavity is clear. Scutum and ossicles are intact and aligned. Right mastoid antrum is clear. Majority of the right mastoid air cells are clear. And right petrous apex air cells are clear. But posteroinferior right mastoid air cells are opacified (series 7, image 42). No associated mastoid coalescence or bone erosion identified. Mildly high riding right IJ bulb with intact overlying bony covering (series 7, image 49 and series 9, image 92. Right IAC, cochlea, vestibule and vestibular aqueduct are within normal limits.  Course of the right 7th nerve remains normal. Right semicircular canals remain within normal limits. LEFT TEMPORAL BONE Subtotal opacification of the right external auditory canal (series 10, image 71). A portion of the EAC near the tympanic membrane remains pneumatized. But tympanic membrane detail is obscured. No definite EAC bony erosion. Completely opacified left tympanic cavity. But the left scutum appears partially eroded (series 10, image 71). However, the left ossicles appear to remain intact and aligned. Left mastoid antrum and air cells are completely opacified. However, no mastoid coalescence is evident. Still, there is some dehiscence of the left sigmoid plate (series 8, image 43 and series 10, image 103). Left IJ bulb appears to remain intact and is not high riding. Left internal auditory canal, cochlea, vestibule and vestibular aqueduct are within normal limits. Left semicircular canals remain within normal limits. Much of the course of the left facial nerve is opacified. Left stylomastoid foramen appears to remain normal. Vascular: Calcified atherosclerosis at the skull base. Limited intracranial: Negative for age visible noncontrast brain parenchyma. Visible orbits/paranasal sinuses: Chronic postoperative changes to both globes. Generally well aerated and somewhat hyperplastic paranasal sinuses. There is mild to moderate mucosal thickening in the inferolateral component of hyperplastic right sphenoid sinus. No  sinus fluid levels. In general there is osteopenia throughout the skull base. Soft tissues: Negative visible noncontrast deep soft tissue spaces of the face. IMPRESSION: 1. Subtotally opacified left external auditory canal with completely opacified left tympanic cavity and mastoids. No obvious ossicle erosion or mastoid coalescence, but both a portion of the left scutum and the left sigmoid venous canal appear partially eroded: The latter places the patient at risk for complication such as  sigmoid venous sinus thrombosis. 2. Mild debris in the right external auditory canal and mild right mastoid effusion. But otherwise negative right temporal bone. 3. Hyperplastic paranasal sinuses with mild to moderate mucosal thickening in the right sphenoid. Electronically Signed: By: Genevie Ann M.D. On: 02/27/2022 10:57    Assessment/Plan 1. Chronic osteomyelitis of temporal bone (HCC) On Oral Cipro, Cipro Ear drops and Linzeloid Tolerating  Follow with Dr Megan Salon   2. Weakness Working with therapy  3. Essential hypertension On Norvasc  4. Hyperlipidemia associated with type 2 diabetes mellitus ([place]) No treatment  5. Type 2 diabetes mellitus with stage 4 chronic kidney disease, without long-term current use of insulin (HCC) Diet Controlled A1C 6.4  6. CKD stage 4 due to type 2 diabetes mellitus ([place]) creat stable Follow weekly BMP per ID   Family/ staff Communication:   Labs/tests ordered:

## 2022-04-21 ENCOUNTER — Ambulatory Visit (INDEPENDENT_AMBULATORY_CARE_PROVIDER_SITE_OTHER): Payer: Medicare Other | Admitting: Internal Medicine

## 2022-04-21 ENCOUNTER — Encounter: Payer: Self-pay | Admitting: Internal Medicine

## 2022-04-21 ENCOUNTER — Other Ambulatory Visit: Payer: Self-pay

## 2022-04-21 DIAGNOSIS — M8668 Other chronic osteomyelitis, other site: Secondary | ICD-10-CM

## 2022-04-21 NOTE — Progress Notes (Signed)
Virtual Visit via Telephone Note  I connected with Jeanette Espinoza on 04/21/22 at  2:00 PM EDT by telephone and verified that I am speaking with the correct person using two identifiers.  Location: Patient: [facility] Provider: [facility]   I discussed the limitations, risks, security and privacy concerns of performing an evaluation and management service by telephone and the availability of in person appointments. I also discussed with the patient that there may be a patient responsible charge related to this service. The patient expressed understanding and agreed to proceed.   History of Present Illness: Jeanette Espinoza is feeling better.  Her nausea has improved and she has not had any more diarrhea for the past 48 hours.  She is eating much better.  She is still in the rehab unit at [facility].  She seems to be tolerating ciprofloxacin and linezolid well.   Observations/Objective:   Assessment and Plan: Her gastrointestinal upset was related to metronidazole and she is improving after stopping it last week.  I will have her continue ciprofloxacin and linezolid for her chronic otitis media, mastoiditis and temporal bone osteomyelitis.  Follow Up Instructions: Continue ciprofloxacin and linezolid for 3 more weeks Weekly CBC with results faxed to [phone] Follow-up here 05/13/2022   I discussed the assessment and treatment plan with the patient. The patient was provided an opportunity to ask questions and all were answered. The patient agreed with the plan and demonstrated an understanding of the instructions.   The patient was advised to call back or seek an in-person evaluation if the symptoms worsen or if the condition fails to improve as anticipated.  I provided 16 minutes of non-face-to-face time during this encounter.   Michel Bickers, MD

## 2022-04-23 ENCOUNTER — Encounter: Payer: Self-pay | Admitting: Internal Medicine

## 2022-04-27 ENCOUNTER — Non-Acute Institutional Stay (SKILLED_NURSING_FACILITY): Payer: Medicare Other | Admitting: Adult Health

## 2022-04-27 DIAGNOSIS — M8668 Other chronic osteomyelitis, other site: Secondary | ICD-10-CM | POA: Diagnosis not present

## 2022-04-27 DIAGNOSIS — R110 Nausea: Secondary | ICD-10-CM | POA: Diagnosis not present

## 2022-04-27 DIAGNOSIS — M25551 Pain in right hip: Secondary | ICD-10-CM | POA: Diagnosis not present

## 2022-04-27 LAB — CBC AND DIFFERENTIAL
HCT: 29 — AB (ref 36–46)
Hemoglobin: 9.7 — AB (ref 12.0–16.0)
WBC: 6.8

## 2022-04-27 LAB — CBC: RBC: 3.17 — AB (ref 3.87–5.11)

## 2022-04-28 ENCOUNTER — Encounter: Payer: Self-pay | Admitting: Adult Health

## 2022-04-28 ENCOUNTER — Encounter: Payer: Self-pay | Admitting: Internal Medicine

## 2022-04-28 ENCOUNTER — Telehealth: Payer: Self-pay | Admitting: Internal Medicine

## 2022-04-28 NOTE — Progress Notes (Signed)
Location:  [facility] psychologist of Service:  SNF (31) Provider:   Cindi Carbon, [place] [phone]   Unk Pinto, MD  Patient Care Team: Unk Pinto, MD as PCP - General (Internal Medicine) Gaynelle Arabian, MD as Consulting Physician (Orthopedic Surgery) Harrie Foreman, [place] as Referring Physician (Optometry) Teena Irani, MD (Inactive) as Consulting Physician (Gastroenterology) Suella Broad, MD as Consulting Physician (Physical Medicine and Rehabilitation) Almyra Deforest, [place] as Consulting Physician (Cardiology) Leta Baptist, MD as Consulting Physician (Otolaryngology) Elisha Ponder, MD as Resident (Otolaryngology)  Extended Emergency Contact Information Primary Emergency Contact: Neva Seat [place] of [place] Phone: [phone] Mobile Phone: [phone] Relation: Daughter Secondary Emergency Contact: Cristela Blue [place] of [place] Mobile Phone: [phone] Relation: Son  Code Status:   Goals of care: Advanced Directive information    04/20/2022   11:33 AM  Advanced Directives  Does Patient Have a Medical Advance Directive? Yes  Type of Paramedic of [place];Living will  Does patient want to make changes to medical advance directive? No - Patient declined  Copy of [place] in Chart? Yes - validated most recent copy scanned in chart (See row information)     Chief Complaint  Patient presents with   Acute Visit    nausea    HPI:  Pt is a 88 y.o. female seen today for an acute visit for nausea.  She was admitted to rehab due to some weakness and GI upset while on antibiotics for osteomyelitis of the temporal bone and left otitis.   Of note from Dr. Hale Bogus note she was seen by ENT Dr Benjamine Mola and found to have a left ear canal polyp, with infection. She was treated with antibiotics and did not respond. Later had a debridement and did not improve. Had  CT in April showing erosion at the mastoid plate. Later had a bx of the polyp and the path showed reactive squamous epithelium with granulation and mixed acute on chronic inflammation. Cultures grew coagulase-negative staph and a corynebacterium species. Then in May she had an MRI which showed active left otitis externa, mastoiditis, osteomyelitis, and inflammation.   She is currently being treated with Cipro and Zyvox per ID, flagyl was stopped due to nausea, diarrhea.   After the flagyl was stopped symptoms of nausea stopped but later returned. No diarrhea per say. One loose or unformed stool per day reported. No abd pain. No Fever She says her ear is not hurting. She has hearing loss but does not feel it has changed. She does not feel dizzy or have vertigo.  She is using zofran for nausea. No emesis. Appetite is reduced.   Also she reports some right hip present since her fall 3 weeks ago. No numbness or tingling. Able to bear weight. Has a hx of hip replacement and knee replacement  Past Medical History:  Diagnosis Date   Arthritis    HLD (hyperlipidemia)    HTN (hypertension)    echo (11/11) with EF 55-60%, mild LV hypertrophy, PA systolic pressure 34 mmHg   Low back pain    Pre-diabetes    diet controlled   Unspecified vitamin D deficiency    Past Surgical History:  Procedure Laterality Date   ABDOMINAL HYSTERECTOMY     partial   abnormal EKG     BACK SURGERY     cataract surgery     colonoscopsy  2008   Neg  Dr Amedeo Plenty  EYE SURGERY Bilateral    cataract   JOINT REPLACEMENT     left hip replacement , right knee replacement    lexiscan myoview  11/11   EF 81%, normal perfusion images suggesting no ischemia or infarction   SPINE SURGERY     lumbar   surgery for DDD     TOTAL HIP ARTHROPLASTY  12/02/2012   Procedure: TOTAL HIP ARTHROPLASTY;  Surgeon: Gearlean Alf, MD;  Location: [facility];  Service: Orthopedics;  Laterality: Right;    No Known Allergies  Outpatient  Encounter Medications as of 04/27/2022  Medication Sig   amLODipine (NORVASC) 5 MG tablet Take 5 mg by mouth daily. At 12 pm   ciprofloxacin (CIPRO) 250 MG tablet Take 1 tablet (250 mg total) by mouth daily.   ciprofloxacin-dexamethasone (CIPRODEX) OTIC suspension Place 4 drops into both ears every morning.   linezolid (ZYVOX) 600 MG tablet Take 1 tablet (600 mg total) by mouth 2 (two) times daily.   saccharomyces boulardii (FLORASTOR) 250 MG capsule Take 1 capsule (250 mg total) by mouth 2 (two) times daily.   No facility-administered encounter medications on file as of 04/27/2022.    Review of Systems  Constitutional:  Positive for activity change (weakness) and appetite change. Negative for chills, diaphoresis, fatigue, fever and unexpected weight change.  HENT:  Negative for congestion.   Respiratory:  Negative for cough, shortness of breath and wheezing.   Cardiovascular:  Negative for chest pain, palpitations and leg swelling.  Gastrointestinal:  Negative for abdominal distention, abdominal pain, constipation and diarrhea.  Genitourinary:  Negative for difficulty urinating and dysuria.  Musculoskeletal:  Positive for arthralgias (right hip pain) and gait problem (needs some assistance with ADLS). Negative for back pain, joint swelling and myalgias.  Neurological:  Positive for weakness. Negative for dizziness, tremors, seizures, syncope, facial asymmetry, speech difficulty, light-headedness, numbness and headaches.  Psychiatric/Behavioral:  Negative for agitation, behavioral problems and confusion.     Immunization History  Administered Date(s) Administered   Influenza Split 10/11/2013   Influenza, High Dose Seasonal PF 09/18/2014, 09/20/2015, 08/04/2017, 09/16/2018, 10/16/2019, 08/15/2021   Influenza, Seasonal, Injecte, Preservative Fre 10/15/2016   Influenza-Unspecified 10/10/2012, 08/09/2020   Moderna Sars-Covid-2 Vaccination 11/27/2019, 12/28/2019, 09/19/2020   Pneumococcal  Conjugate-13 12/19/2014   Pneumococcal Polysaccharide-23 10/11/2013   Tdap 09/21/2012   Pertinent  Health Maintenance Due  Topic Date Due   FOOT EXAM  05/14/2022   INFLUENZA VACCINE  06/09/2022   HEMOGLOBIN A1C  06/11/2022   URINE MICROALBUMIN  08/15/2022   OPHTHALMOLOGY EXAM  10/09/2022   DEXA SCAN  Discontinued      12/11/2020   11:57 AM 05/14/2021   10:07 PM 12/12/2021   11:01 AM 04/08/2022   10:07 AM 04/15/2022    8:33 AM  Fall Risk  Falls in the past year? 0 0 0 1 1  Was there an injury with Fall? 0  0 1 1  Fall Risk Category Calculator 0  0 2 2  Fall Risk Category Low  Low Moderate Moderate  Patient Fall Risk Level Moderate fall risk  Moderate fall risk  High fall risk  Patient at Risk for Falls Due to Impaired mobility;Impaired balance/gait;History of fall(s) No Fall Risks History of fall(s);Impaired mobility;Impaired balance/gait History of fall(s) History of fall(s);Impaired balance/gait;Impaired mobility  Fall risk Follow up Falls evaluation completed;Education provided;Falls prevention discussed Falls evaluation completed;Education provided;Falls prevention discussed Falls prevention discussed;Falls evaluation completed;Education provided Falls evaluation completed Falls evaluation completed  Fall risk Follow up - Comments  Declines PT this visit     Functional Status Survey:    Vitals:   04/28/22 0749  BP: 107/68  Pulse: 87  Resp: 17  Temp: 98.1 F (36.7 C)  SpO2: 97%   There is no height or weight on file to calculate BMI. Physical Exam Vitals and nursing note reviewed.  Constitutional:      General: She is not in acute distress.    Appearance: She is not diaphoretic.  HENT:     Head: Normocephalic and atraumatic.     Right Ear: Tympanic membrane normal.     Left Ear: Ear canal and external ear normal.     Ears:     Comments: No purulent drainage noted. Not able to see light reflex    Nose: Nose normal.     Mouth/Throat:     Mouth: Mucous membranes are  moist.     Pharynx: Oropharynx is clear.  Neck:     Vascular: No JVD.  Cardiovascular:     Rate and Rhythm: Normal rate and regular rhythm.     Heart sounds: No murmur heard. Pulmonary:     Effort: Pulmonary effort is normal. No respiratory distress.     Breath sounds: Normal breath sounds. No wheezing.  Abdominal:     General: Bowel sounds are normal. There is no distension.     Palpations: Abdomen is soft.     Tenderness: There is no abdominal tenderness. There is no right CVA tenderness, left CVA tenderness or guarding.  Musculoskeletal:        General: No swelling, tenderness, deformity or signs of injury.     Right lower leg: No edema.     Left lower leg: No edema.     Comments: No pain with rotation of the right hip. No tenderness on palpation or deformity   Skin:    General: Skin is warm and dry.     Findings: Bruising (left arm) present.  Neurological:     Mental Status: She is alert and oriented to person, place, and time.  Psychiatric:        Mood and Affect: Mood normal.     Labs reviewed: Recent Labs    05/15/21 1123 08/15/21 1053 12/12/21 1128 01/07/22 1626 04/02/22 1151  NA 140   < > 138 135 141  K 4.7   < > 5.3 5.0 5.0  CL 104   < > 101 101 106  CO2 25   < > 28 21 23   GLUCOSE 139*   < > 142* 156* 142*  BUN 30*   < > 34* 39* 38*  CREATININE 1.90*   < > 2.31* 2.01* 1.88*  CALCIUM 10.1   < > 9.9 9.7 10.0  MG 2.7*  --  3.1*  --   --    < > = values in this interval not displayed.   Recent Labs    12/12/21 1128 01/07/22 1626 04/02/22 1151  AST 14 12 13   ALT 9 11 10   BILITOT 0.4 0.3 0.3  PROT 7.5 7.5 7.9   Recent Labs    12/12/21 1128 01/07/22 1626 04/02/22 1151  WBC 9.0 13.1* 9.5  NEUTROABS 4,995 8,764* 5,900  HGB 11.5* 10.6* 11.5*  HCT 34.7* 31.6* 35.1  MCV 93.8 92.9 92.6  PLT 198 358 245   Lab Results  Component Value Date   TSH 2.22 12/12/2021   Lab Results  Component Value Date   HGBA1C 6.5 (H) 12/12/2021   Lab Results  Component Value Date   CHOL 307 (H) 08/15/2021   HDL 49 (L) 08/15/2021   LDLCALC 198 (H) 08/15/2021   TRIG 356 (H) 08/15/2021   CHOLHDL 6.3 (H) 08/15/2021    Significant Diagnostic Results in last 30 days:  No results found.  Assessment/Plan 1. Nausea Would avoid the use of Zofran with Zyvox when possible Likely due to antibiotic therapy Continue florastor Change diet to bland with small meals If continues could try low dose of compazine or phenergan but would like to avoid this given her age No true diarrhea is noted but if this begin would check Cdiff x 2 order written  2. Chronic osteomyelitis of temporal bone (HCC) Continues on Zyvox and Cipro and Cipro dex Less drainage in ear noted Continue current course, f/u with ID  3. Right hip pain No deformity or pain with range of motion Add tylenol 1 gram bid       Labs/tests ordered:  CBC weekly

## 2022-04-28 NOTE — Telephone Encounter (Signed)
Jeanette Espinoza,  Please ask the staff at [facility] to send me the results of the stool test.  Also, let me know if you think your mother needs to stop the oral antibiotics.  Does she have a follow-up appointment with Dr. Suszanne Finch?  Jeanette Espinoza ===View-only below this line===   ----- Message -----      From:Jeanette Espinoza      Sent:04/28/2022  1:13 PM EDT        LO:VFIE Megan Salon, MD   Subject:Nausea and diarrhea  Dr. Megan Salon, I wanted to make you aware that Mom, Jeanette Espinoza, has had continued nausea that seems to have worsened, especially as of today. Diarrhea is enough of a new problem that I believe [facility] took a stool sample to evaluate. I am assuming that the infection would not cause the stomach and intestinal issues. I know there are no easy answers to her issues/diagnosis, but wanted to let you know what I know as of today. She has not eaten at all today and naturally is getting much weaker. Her food consumption has slowly decreased since we talked a week ago.   Thanks for "listening" via the portal. Daughter, Jeanette Espinoza

## 2022-04-29 ENCOUNTER — Other Ambulatory Visit: Payer: Self-pay | Admitting: Internal Medicine

## 2022-04-29 LAB — BASIC METABOLIC PANEL
BUN: 30 — AB (ref 4–21)
CO2: 16 (ref 13–22)
Chloride: 94 — AB (ref 99–108)
Creatinine: 1.9 — AB (ref 0.5–1.1)
Glucose: 86
Potassium: 3.9 mEq/L (ref 3.5–5.1)

## 2022-04-29 LAB — COMPREHENSIVE METABOLIC PANEL
Albumin: 3.7 (ref 3.5–5.0)
Calcium: 8.5 — AB (ref 8.7–10.7)

## 2022-04-29 NOTE — Telephone Encounter (Signed)
Orders faxed to [facility] to d/c antibiotics. Confirmation received. Leatrice Jewels, RMA

## 2022-04-30 ENCOUNTER — Encounter: Payer: Self-pay | Admitting: Internal Medicine

## 2022-05-04 LAB — CBC AND DIFFERENTIAL
HCT: 27 — AB (ref 36–46)
Hemoglobin: 9.3 — AB (ref 12.0–16.0)
Platelets: 104 10*3/uL — AB (ref 150–400)
WBC: 8

## 2022-05-04 LAB — CBC: RBC: 3.01 — AB (ref 3.87–5.11)

## 2022-05-07 ENCOUNTER — Non-Acute Institutional Stay (SKILLED_NURSING_FACILITY): Payer: Medicare Other | Admitting: Adult Health

## 2022-05-07 ENCOUNTER — Encounter: Payer: Self-pay | Admitting: Adult Health

## 2022-05-07 DIAGNOSIS — R110 Nausea: Secondary | ICD-10-CM

## 2022-05-07 DIAGNOSIS — D696 Thrombocytopenia, unspecified: Secondary | ICD-10-CM

## 2022-05-07 DIAGNOSIS — R413 Other amnesia: Secondary | ICD-10-CM

## 2022-05-07 DIAGNOSIS — N184 Chronic kidney disease, stage 4 (severe): Secondary | ICD-10-CM | POA: Diagnosis not present

## 2022-05-07 DIAGNOSIS — M8668 Other chronic osteomyelitis, other site: Secondary | ICD-10-CM | POA: Diagnosis not present

## 2022-05-07 DIAGNOSIS — D631 Anemia in chronic kidney disease: Secondary | ICD-10-CM

## 2022-05-07 DIAGNOSIS — R531 Weakness: Secondary | ICD-10-CM

## 2022-05-07 DIAGNOSIS — E1122 Type 2 diabetes mellitus with diabetic chronic kidney disease: Secondary | ICD-10-CM | POA: Diagnosis not present

## 2022-05-07 DIAGNOSIS — M25551 Pain in right hip: Secondary | ICD-10-CM

## 2022-05-07 NOTE — Progress Notes (Signed)
Location:  [facility] Room Number: 157-A Place of Service:  SNF [phone]) Provider:  Royal Hawthorn, NP   Patient Care Team: Unk Pinto, MD as PCP - General (Internal Medicine) Gaynelle Arabian, MD as Consulting Physician (Orthopedic Surgery) Harrie Foreman, [place] as Referring Physician (Optometry) Teena Irani, MD (Inactive) as Consulting Physician (Gastroenterology) Suella Broad, MD as Consulting Physician (Physical Medicine and Rehabilitation) Almyra Deforest, [place] as Consulting Physician (Cardiology) Leta Baptist, MD as Consulting Physician (Otolaryngology) Elisha Ponder, MD as Resident (Otolaryngology)  Extended Emergency Contact Information Primary Emergency Contact: Neva Seat [place] of [place] Phone: [phone] Mobile Phone: [phone] Relation: Daughter Secondary Emergency Contact: Cristela Blue [place] of [place] Mobile Phone: [phone] Relation: Son   Goals of care: Advanced Directive information    05/07/2022    9:48 AM  Advanced Directives  Does Patient Have a Medical Advance Directive? Yes  Type of Paramedic of [place];Living will  Does patient want to make changes to medical advance directive? No - Patient declined  Copy of [place] in Chart? Yes - validated most recent copy scanned in chart (See row information)     Chief Complaint  Patient presents with   Acute Visit    Right side hip pain     HPI:  Pt is a 88 y.o. female seen today for an acute visit for right hip pain.   She was admitted to rehab due to some weakness and GI upset while on antibiotics for osteomyelitis of the temporal bone and left otitis.   Of note from Dr. Hale Bogus note she was seen by ENT Dr Benjamine Mola and found to have a left ear canal polyp, with infection. She was treated with antibiotics and did not respond. Later had a debridement and did not improve. Had CT in April showing  erosion at the mastoid plate. Later had a bx of the polyp and the path showed reactive squamous epithelium with granulation and mixed acute on chronic inflammation. Cultures grew coagulase-negative staph and a corynebacterium species. Then in May she had an MRI which showed active left otitis externa, mastoiditis, osteomyelitis, and inflammation.   She was placed on flagyl, cipro, and Zyvox but was not able to tolerate this regimen due to loose stools and nausea. Cdiff was tested and was negative.   Antibiotics were stopped 6/21. They were started on 5/31.  She is not having any pain or fever.  Hearing loss is worsening and makes it hard to communicate. She has had some confusion and forgetfulness but is doing well for my visit. She needs helps with ADLs and transfers due to weakness. She is using tylenol for right hip pain which helps. She says it is worse in the morning and gets better as the day goes on. She says her low back hurts as well. She has had injections in the hip and back as well for arthritis. Has had both hips replaced.   Right hip xray showed 05/01/22 no acute fracture with normal hardware alignment   Hemoccults ordered due to loose stool on 6/27 but no stools since then cdiff has been neg with  no abd or fever Hgb trended down 04/02/22 11.5, now 9.3 6/26 with plt 104    Past Medical History:  Diagnosis Date   Arthritis    HLD (hyperlipidemia)    HTN (hypertension)    echo (11/11) with EF 55-60%, mild LV hypertrophy, PA systolic pressure 34 mmHg   Low back  pain    Pre-diabetes    diet controlled   Unspecified vitamin D deficiency    Past Surgical History:  Procedure Laterality Date   ABDOMINAL HYSTERECTOMY     partial   abnormal EKG     BACK SURGERY     cataract surgery     colonoscopsy  2008   Neg  Dr Amedeo Plenty   EYE SURGERY Bilateral    cataract   JOINT REPLACEMENT     left hip replacement , right knee replacement    lexiscan myoview  11/11   EF 81%, normal  perfusion images suggesting no ischemia or infarction   SPINE SURGERY     lumbar   surgery for DDD     TOTAL HIP ARTHROPLASTY  12/02/2012   Procedure: TOTAL HIP ARTHROPLASTY;  Surgeon: Gearlean Alf, MD;  Location: [facility];  Service: Orthopedics;  Laterality: Right;    No Known Allergies  Outpatient Encounter Medications as of 05/07/2022  Medication Sig   acetaminophen (TYLENOL) 500 MG tablet Take 500 mg by mouth 2 (two) times daily. Between 8-11 am and 4-7 pm   acetaminophen (TYLENOL) 500 MG tablet Take 500 mg by mouth 2 (two) times daily as needed. See other listing for scheduled dosing   amLODipine (NORVASC) 5 MG tablet Take 5 mg by mouth daily. At 12 pm   meclizine (ANTIVERT) 12.5 MG tablet Take 12.5 mg by mouth 2 (two) times daily as needed for dizziness.   pantoprazole (PROTONIX) 20 MG tablet Take 20 mg by mouth daily. Between 8-11 am   saccharomyces boulardii (FLORASTOR) 250 MG capsule Take 1 capsule (250 mg total) by mouth 2 (two) times daily.   [DISCONTINUED] ciprofloxacin-dexamethasone (CIPRODEX) OTIC suspension Place 4 drops into both ears every morning.   No facility-administered encounter medications on file as of 05/07/2022.    Review of Systems  Constitutional:  Positive for activity change and appetite change. Negative for chills, diaphoresis, fatigue, fever and unexpected weight change.  HENT:  Negative for congestion.   Respiratory:  Negative for cough, shortness of breath and wheezing.   Cardiovascular:  Negative for chest pain, palpitations and leg swelling.  Gastrointestinal:  Positive for nausea (improving). Negative for abdominal distention, abdominal pain, constipation, diarrhea, rectal pain and vomiting.  Genitourinary:  Negative for difficulty urinating and dysuria.  Musculoskeletal:  Positive for arthralgias (right hip and low back pain) and gait problem. Negative for back pain, joint swelling and myalgias.  Neurological:  Negative for dizziness, tremors,  seizures, syncope, facial asymmetry, speech difficulty, weakness, light-headedness, numbness and headaches.  Psychiatric/Behavioral:  Positive for confusion (memory loss). Negative for agitation and behavioral problems.     Immunization History  Administered Date(s) Administered   Influenza Split 10/11/2013   Influenza, High Dose Seasonal PF 09/18/2014, 09/20/2015, 08/04/2017, 09/16/2018, 10/16/2019, 08/15/2021   Influenza, Seasonal, Injecte, Preservative Fre 10/15/2016   Influenza-Unspecified 10/10/2012, 08/09/2020   Moderna Sars-Covid-2 Vaccination 11/27/2019, 12/28/2019, 09/19/2020   Pneumococcal Conjugate-13 12/19/2014   Pneumococcal Polysaccharide-23 10/11/2013   Tdap 09/21/2012   Pertinent  Health Maintenance Due  Topic Date Due   FOOT EXAM  05/14/2022   INFLUENZA VACCINE  06/09/2022   HEMOGLOBIN A1C  06/11/2022   URINE MICROALBUMIN  08/15/2022   OPHTHALMOLOGY EXAM  10/09/2022   DEXA SCAN  Discontinued      12/11/2020   11:57 AM 05/14/2021   10:07 PM 12/12/2021   11:01 AM 04/08/2022   10:07 AM 04/15/2022    8:33 AM  Fall Risk  Falls in  the past year? 0 0 0 1 1  Was there an injury with Fall? 0  0 1 1  Fall Risk Category Calculator 0  0 2 2  Fall Risk Category Low  Low Moderate Moderate  Patient Fall Risk Level Moderate fall risk  Moderate fall risk  High fall risk  Patient at Risk for Falls Due to Impaired mobility;Impaired balance/gait;History of fall(s) No Fall Risks History of fall(s);Impaired mobility;Impaired balance/gait History of fall(s) History of fall(s);Impaired balance/gait;Impaired mobility  Fall risk Follow up Falls evaluation completed;Education provided;Falls prevention discussed Falls evaluation completed;Education provided;Falls prevention discussed Falls prevention discussed;Falls evaluation completed;Education provided Falls evaluation completed Falls evaluation completed  Fall risk Follow up - Comments   Declines PT this visit     Functional Status Survey:     Vitals:   05/07/22 0938  BP: 123/68  Pulse: 80  Resp: 12  Temp: 98.3 F (36.8 C)  SpO2: 98%  Weight: 113 lb 6.4 oz (51.4 kg)  Height: 4\' 11"  (1.499 m)   Body mass index is 22.9 kg/m. Physical Exam Vitals and nursing note reviewed.  Constitutional:      General: She is not in acute distress.    Appearance: She is not diaphoretic.  HENT:     Head: Normocephalic and atraumatic.     Right Ear: Tympanic membrane normal.     Left Ear: Ear canal and external ear normal. There is no impacted cerumen.     Ears:     Comments: Left TM with small amt of brown discoloration, light reflex noted. No purulent matter. No tenderness to mandible or temporal area     Nose: Nose normal.     Mouth/Throat:     Mouth: Mucous membranes are moist.     Pharynx: Oropharynx is clear.  Neck:     Vascular: No JVD.  Cardiovascular:     Rate and Rhythm: Normal rate and regular rhythm.     Heart sounds: No murmur heard. Pulmonary:     Effort: Pulmonary effort is normal. No respiratory distress.     Breath sounds: Normal breath sounds. No wheezing.  Abdominal:     General: Bowel sounds are normal. There is no distension.     Palpations: Abdomen is soft.     Tenderness: There is no abdominal tenderness.  Musculoskeletal:        General: No swelling, tenderness, deformity or signs of injury.     Right lower leg: No edema.     Left lower leg: No edema.     Comments: Mild pain with ROM of the right hip.   Skin:    General: Skin is warm and dry.  Neurological:     Mental Status: She is alert and oriented to person, place, and time.     Labs reviewed: Recent Labs    05/15/21 1123 08/15/21 1053 12/12/21 1128 01/07/22 1626 04/02/22 1151 04/29/22 0000  NA 140   < > 138 135 141  --   K 4.7   < > 5.3 5.0 5.0 3.9  CL 104   < > 101 101 106 94*  CO2 25   < > 28 21 23 16   GLUCOSE 139*   < > 142* 156* 142*  --   BUN 30*   < > 34* 39* 38* 30*  CREATININE 1.90*   < > 2.31* 2.01* 1.88* 1.9*   CALCIUM 10.1   < > 9.9 9.7 10.0 8.5*  MG 2.7*  --  3.1*  --   --   --    < > =  values in this interval not displayed.   Recent Labs    12/12/21 1128 01/07/22 1626 04/02/22 1151 04/29/22 0000  AST 14 12 13   --   ALT 9 11 10   --   BILITOT 0.4 0.3 0.3  --   PROT 7.5 7.5 7.9  --   ALBUMIN  --   --   --  3.7   Recent Labs    12/12/21 1128 01/07/22 1626 04/02/22 1151 04/27/22 0000 05/04/22 0000  WBC 9.0 13.1* 9.5 6.8 8.0  NEUTROABS 4,995 8,764* 5,900  --   --   HGB 11.5* 10.6* 11.5* 9.7* 9.3*  HCT 34.7* 31.6* 35.1 29* 27*  MCV 93.8 92.9 92.6  --   --   PLT 198 358 245  --  104*   Lab Results  Component Value Date   TSH 2.22 12/12/2021   Lab Results  Component Value Date   HGBA1C 6.5 (H) 12/12/2021   Lab Results  Component Value Date   CHOL 307 (H) 08/15/2021   HDL 49 (L) 08/15/2021   LDLCALC 198 (H) 08/15/2021   TRIG 356 (H) 08/15/2021   CHOLHDL 6.3 (H) 08/15/2021    Significant Diagnostic Results in last 30 days:  No results found.  Assessment/Plan  1. Chronic osteomyelitis of temporal bone ([place]) Off antibiotic due to GI upset NO pain or fever Going to f/u with ID next week ?comfort care   2. CKD stage 4 due to type 2 diabetes mellitus ([place]) Lab Results  Component Value Date   BUN 30 (A) 04/29/2022   Lab Results  Component Value Date   CREATININE 1.9 (A) 04/29/2022   Estimated Creatinine Clearance: 12.3 mL/min (A) (by C-G formula based on SCr of 1.9 mg/dL (A)). Continue to periodically monitor BMP and avoid nephrotoxic agents   3. Anemia due to stage 4 chronic kidney disease (HCC) Slight worsening, not sure if this is due to acute illness or myleosuppression Follow CBC and f/u with ID off zyvox  4. Nausea Improving, has a hx of this per her daughter which was worse on antibiotics Would continue protonix and florastor for now.   5. Right hip pain Continue with tylenol, discussed with her daughter [name] and we will hold off on referral at this  time.   6. Thrombocytopenia (HCC) Mild, will recheck CBC next week See above.   7. Memory loss Worsening with hearing loss ST to eval and tx  8.  Weakness Due to illness with low appetite Continues to work with therapy Now a skilled level of care but hoping to progress now that her GI upset is improving.   Family/ staff Communication: discussed with daughter Myrtie Neither ordered:  CBC

## 2022-05-11 LAB — CBC AND DIFFERENTIAL
HCT: 25 — AB (ref 36–46)
Hemoglobin: 8.4 — AB (ref 12.0–16.0)
Platelets: 310 10*3/uL (ref 150–400)
WBC: 8.1

## 2022-05-11 LAB — CBC: RBC: 2.75 — AB (ref 3.87–5.11)

## 2022-05-13 ENCOUNTER — Ambulatory Visit: Payer: Medicare Other | Admitting: Internal Medicine

## 2022-05-14 ENCOUNTER — Other Ambulatory Visit: Payer: Self-pay

## 2022-05-14 ENCOUNTER — Ambulatory Visit: Payer: Medicare Other | Admitting: Internal Medicine

## 2022-05-14 ENCOUNTER — Encounter: Payer: Self-pay | Admitting: Internal Medicine

## 2022-05-14 DIAGNOSIS — M8668 Other chronic osteomyelitis, other site: Secondary | ICD-10-CM

## 2022-05-14 NOTE — Progress Notes (Signed)
Jeanette Espinoza for Infectious Disease  Patient Active Problem List   Diagnosis Date Noted   Chronic otitis media 04/08/2022    Priority: High   Mastoiditis of left side 04/08/2022    Priority: High   Chronic osteomyelitis of temporal bone ([place]) 04/08/2022    Priority: High   Hard of hearing 12/16/2021   Sedentary lifestyle 12/11/2020   At moderate risk for fall 12/11/2020   Type 2 diabetes mellitus with stage 4 chronic kidney disease, without long-term current use of insulin ([place]) 01/23/2020   Hyperlipidemia associated with type 2 diabetes mellitus ([place]) 01/23/2020   Pain due to onychomycosis of toenails of both feet 06/09/2019   Senile purpura ([place]) 09/16/2018   ASHD (arteriosclerotic heart disease) 09/14/2018   CKD stage 4 due to type 2 diabetes mellitus ([place]) 11/03/2017   Essential hypertension 01/02/2016   Medication management 02/06/2014   Diet-controlled diabetes mellitus ([place]) 02/06/2014   Vitamin D deficiency    OA (osteoarthritis) of hip 12/02/2012    Patient's Medications  New Prescriptions   No medications on file  Previous Medications   ACETAMINOPHEN (TYLENOL) 500 MG TABLET    Take 500 mg by mouth 2 (two) times daily. Between 8-11 am and 4-7 pm   ACETAMINOPHEN (TYLENOL) 500 MG TABLET    Take 500 mg by mouth 2 (two) times daily as needed. See other listing for scheduled dosing   AMLODIPINE (NORVASC) 5 MG TABLET    Take 5 mg by mouth daily. At 12 pm   MECLIZINE (ANTIVERT) 12.5 MG TABLET    Take 12.5 mg by mouth 2 (two) times daily as needed for dizziness.   PANTOPRAZOLE (PROTONIX) 20 MG TABLET    Take 20 mg by mouth daily. Between 8-11 am   SACCHAROMYCES BOULARDII (FLORASTOR) 250 MG CAPSULE    Take 1 capsule (250 mg total) by mouth 2 (two) times daily.  Modified Medications   No medications on file  Discontinued Medications   No medications on file    Subjective: Jeanette Espinoza is in for her routine follow-up visit.  She is accompanied by her daughter, Jeanette Espinoza.   I first saw Jeanette Espinoza on 04/08/2022.  She had been developing progressive hearing loss in her left ear.  She was found to have a large left external canal polyp complicated by chronic otitis media, mastoiditis and temporal bone osteomyelitis.  After a long discussion about treatment options we elected to start her on levofloxacin, ciprofloxacin and metronidazole.  As soon as she started these antibiotics she developed fairly severe nausea, vomiting, diarrhea and anorexia.  She had to move from her independent living apartment to the rehab unit at [facility] where she has remained ever since.  I instructed her to stop metronidazole on 04/13/2022.  She had some slight improvement but continued to be extremely weak with poor p.o. intake, intermittent nausea, vomiting and diarrhea.  I instructed her to stop all antibiotics on 04/29/2022.  She is slowly improving.  She still has occasional nausea but has not had recent problems with vomiting or diarrhea.  Appetite has improved and she is getting stronger.  She has not had any recent fever, chills, sweats headache or ear pain.  She denies having any recent drainage from her left ear.  Review of Systems: Review of Systems  Constitutional:  Positive for malaise/fatigue. Negative for chills, diaphoresis and fever.  HENT:  Positive for hearing loss. Negative for ear discharge and ear pain.   Gastrointestinal:  Positive for  nausea. Negative for abdominal pain, diarrhea and vomiting.  Neurological:  Negative for headaches.    Past Medical History:  Diagnosis Date   Arthritis    HLD (hyperlipidemia)    HTN (hypertension)    echo (11/11) with EF 55-60%, mild LV hypertrophy, PA systolic pressure 34 mmHg   Low back pain    Pre-diabetes    diet controlled   Unspecified vitamin D deficiency     Social History   Tobacco Use   Smoking status: Former    Types: Cigarettes    Quit date: 11/09/1970    Years since quitting: 51.5   Smokeless tobacco: Never  Substance  Use Topics   Alcohol use: Yes    Comment: occasional    Drug use: No    Family History  Problem Relation Age of Onset   Heart disease Mother    Diabetes Other        DM - sibiling    Coronary artery disease Neg Hx        no premature CAd     No Known Allergies  Objective: Vitals:   05/14/22 1113  BP: 117/76  Pulse: 79  Temp: 98 F (36.7 C)  TempSrc: Temporal  SpO2: 100%   There is no height or weight on file to calculate BMI.  Physical Exam Constitutional:      Comments: She is very calm and pleasant.  She is seated in a wheelchair.  She remains very hard of hearing.  HENT:     Head:     Comments: She has no redness, swelling or tenderness around her left ear or mastoid sinuses.  She has no obvious drainage from her left ear. Psychiatric:        Mood and Affect: Mood normal.     Lab Results    Problem List Items Addressed This Visit       High   Chronic osteomyelitis of temporal bone ([place])    She developed severe adverse reaction to her recent antibiotic therapy and is improving slowly after stopping antibiotics a little over 2 weeks ago.  Long discussion with him today about treatment options.  She has no interest in PICC placement and IV antibiotic therapy.  I told them that we could try an alternate regimen of oral antibiotics but this may also cause side effects and sent her back even further.  I talked about focusing on improving her strength and comfort but withholding antibiotics now.  She has a team meeting scheduled for next Monday at [facility].  I asked her to consider what goals are most important to her.  She is very focused on being strong enough to return to her independent living apartment as soon as possible.  We are in agreement with not restarting antibiotics at this time.  She is free to follow-up with me if they want to reconsider this at any time.        Michel Bickers, MD [facility] for Infectious [place]  Group [phone] pager   [phone] cell 05/14/2022, 12:14 PM

## 2022-05-14 NOTE — Assessment & Plan Note (Signed)
She developed severe adverse reaction to her recent antibiotic therapy and is improving slowly after stopping antibiotics a little over 2 weeks ago.  Long discussion with him today about treatment options.  She has no interest in PICC placement and IV antibiotic therapy.  I told them that we could try an alternate regimen of oral antibiotics but this may also cause side effects and sent her back even further.  I talked about focusing on improving her strength and comfort but withholding antibiotics now.  She has a team meeting scheduled for next Monday at [facility].  I asked her to consider what goals are most important to her.  She is very focused on being strong enough to return to her independent living apartment as soon as possible.  We are in agreement with not restarting antibiotics at this time.  She is free to follow-up with me if they want to reconsider this at any time.

## 2022-05-15 ENCOUNTER — Encounter: Payer: Medicare Other | Admitting: Internal Medicine

## 2022-05-18 LAB — CBC: RBC: 2.93 — AB (ref 3.87–5.11)

## 2022-05-18 LAB — CBC AND DIFFERENTIAL
HCT: 27 — AB (ref 36–46)
Hemoglobin: 9 — AB (ref 12.0–16.0)
Platelets: 272 10*3/uL (ref 150–400)
WBC: 6.8

## 2022-05-18 LAB — IRON,TIBC AND FERRITIN PANEL
Ferritin: 120.3
Iron: 41

## 2022-05-20 ENCOUNTER — Encounter: Payer: Self-pay | Admitting: Internal Medicine

## 2022-05-21 ENCOUNTER — Encounter: Payer: Self-pay | Admitting: Adult Health

## 2022-05-21 ENCOUNTER — Non-Acute Institutional Stay (SKILLED_NURSING_FACILITY): Payer: Medicare Other | Admitting: Adult Health

## 2022-05-21 DIAGNOSIS — D631 Anemia in chronic kidney disease: Secondary | ICD-10-CM

## 2022-05-21 DIAGNOSIS — E1122 Type 2 diabetes mellitus with diabetic chronic kidney disease: Secondary | ICD-10-CM

## 2022-05-21 DIAGNOSIS — M8668 Other chronic osteomyelitis, other site: Secondary | ICD-10-CM | POA: Diagnosis not present

## 2022-05-21 DIAGNOSIS — E785 Hyperlipidemia, unspecified: Secondary | ICD-10-CM

## 2022-05-21 DIAGNOSIS — R531 Weakness: Secondary | ICD-10-CM | POA: Diagnosis not present

## 2022-05-21 DIAGNOSIS — I10 Essential (primary) hypertension: Secondary | ICD-10-CM

## 2022-05-21 DIAGNOSIS — R413 Other amnesia: Secondary | ICD-10-CM

## 2022-05-21 DIAGNOSIS — N184 Chronic kidney disease, stage 4 (severe): Secondary | ICD-10-CM

## 2022-05-21 DIAGNOSIS — E1169 Type 2 diabetes mellitus with other specified complication: Secondary | ICD-10-CM

## 2022-05-21 DIAGNOSIS — H903 Sensorineural hearing loss, bilateral: Secondary | ICD-10-CM

## 2022-05-21 NOTE — Addendum Note (Signed)
Addended by: Barnie Mort on: 05/21/2022 02:47 PM   Modules accepted: Orders

## 2022-05-21 NOTE — Progress Notes (Signed)
Location:    [facility] Room Number: 401 Place of Service:  SNF (31)  Provider: Royal Hawthorn, NP   PCP: No primary care provider on file. Patient Care Team: Gaynelle Arabian, MD as Consulting Physician (Orthopedic Surgery) Harrie Foreman, [place] as Referring Physician (Optometry) Teena Irani, MD (Inactive) as Consulting Physician (Gastroenterology) Suella Broad, MD as Consulting Physician (Physical Medicine and Rehabilitation) Almyra Deforest, [place] as Consulting Physician (Cardiology) Leta Baptist, MD as Consulting Physician (Otolaryngology) Elisha Ponder, MD as Resident (Otolaryngology)  Extended Emergency Contact Information Primary Emergency Contact: Neva Seat [place] Phone: [phone] Mobile Phone: [phone] Relation: Daughter Secondary Emergency Contact: Cristela Blue [place] Mobile Phone: [phone] Relation: Son  Goals of care:  Advanced Directive information    05/21/2022   10:08 AM  Advanced Directives  Does Patient Have a Medical Advance Directive? Yes  Type of Paramedic of [place];Living will  Does patient want to make changes to medical advance directive? No - Patient declined  Copy of [place] in Chart? Yes - validated most recent copy scanned in chart (See row information)     No Known Allergies  Chief Complaint  Patient presents with   Discharge Note    Discharge from rehab.     HPI:  88 y.o. female seen for discharge from rehab. She was admitted in June of 2023 due to weakness, nausea, left otitis,  and osteomyelitis of the temporal bone   Of note from Dr. Hale Bogus note she was seen by ENT Dr Benjamine Mola and found to have a left ear canal polyp, with infection. She was treated with antibiotics and did not respond. Later had a debridement and did not improve. Had CT in April showing erosion at the mastoid plate. Later had a bx of the  polyp and the path showed reactive squamous epithelium with granulation and mixed acute on chronic inflammation. Cultures grew coagulase-negative staph and a corynebacterium species. Then in May she had an MRI which showed active left otitis externa, mastoiditis, osteomyelitis, and inflammation.   She was prescribed cipro, zyvox, and flagyl 04/08/22. She developed nausea and diarrhea. Initially the flagyl as discontinued. Then symptoms continued and later cipro and zyvox were discontinued 04/29/22.  Nausea and diarrhea subsided.    She met with Dr Megan Salon and decided to forego IV antibiotics. She is off any treatment at this time.   She does have CKD IV and is taking sodium bicarb for metabolic acidosis per [place] [facility].  She has some memory loss and is going home with med management and home care She is able to ambulate with a walker. Has some right hip pain due to OA/bursitis.   During her stay she developed anemia.  Hgb 9.0 05/18/22, was 11.3 one month ago Iron 41 Ferritin 120 05/18/22 Had three hemoccults done which were positive.  She is not having any obvious blood. No noted hemorrhoids. No abd pain or GI upset.     Past Medical History:  Diagnosis Date   Arthritis    HLD (hyperlipidemia)    HTN (hypertension)    echo (11/11) with EF 55-60%, mild LV hypertrophy, PA systolic pressure 34 mmHg   Low back pain    Pre-diabetes    diet controlled   Unspecified vitamin D deficiency     Past Surgical History:  Procedure Laterality Date   ABDOMINAL HYSTERECTOMY     partial   abnormal EKG  BACK SURGERY     cataract surgery     colonoscopsy  2008   Neg  Dr Amedeo Plenty   EYE SURGERY Bilateral    cataract   JOINT REPLACEMENT     left hip replacement , right knee replacement    lexiscan myoview  11/11   EF 81%, normal perfusion images suggesting no ischemia or infarction   SPINE SURGERY     lumbar   surgery for DDD     TOTAL HIP ARTHROPLASTY  12/02/2012   Procedure: TOTAL HIP  ARTHROPLASTY;  Surgeon: Gearlean Alf, MD;  Location: [facility] ORS;  Service: Orthopedics;  Laterality: Right;      reports that she quit smoking about 51 years ago. Her smoking use included cigarettes. She has never used smokeless tobacco. She reports current alcohol use. She reports that she does not use drugs. Social History   Socioeconomic History   Marital status: Single    Spouse name: Not on file   Number of children: Not on file   Years of education: Not on file   Highest education level: Not on file  Occupational History   Not on file  Tobacco Use   Smoking status: Former    Types: Cigarettes    Quit date: 11/09/1970    Years since quitting: 51.5   Smokeless tobacco: Never  Vaping Use   Vaping Use: Not on file  Substance and Sexual Activity   Alcohol use: Yes    Comment: occasional    Drug use: No   Sexual activity: Not on file  Other Topics Concern   Not on file  Social History Narrative   Retired, lives in [place]. Widowed   Daughter in [place]. [name] [phone])   Does not get regular exercise   Social Determinants of Health   Financial Resource Strain: Not on file  Food Insecurity: Not on file  Transportation Needs: Not on file  Physical Activity: Not on file  Stress: Not on file  Social Connections: Not on file  Intimate Partner Violence: Not on file   Functional Status Survey:    No Known Allergies  Pertinent  Health Maintenance Due  Topic Date Due   FOOT EXAM  05/14/2022   INFLUENZA VACCINE  06/09/2022   HEMOGLOBIN A1C  06/11/2022   URINE MICROALBUMIN  08/15/2022   OPHTHALMOLOGY EXAM  10/09/2022   DEXA SCAN  Discontinued    Medications: Allergies as of 05/21/2022   No Known Allergies      Medication List        Accurate as of May 21, 2022 10:08 AM. If you have any questions, ask your nurse or doctor.          acetaminophen 500 MG tablet Commonly known as: TYLENOL Take 500 mg by mouth 2 (two) times daily. Between 8-11 am and 4-7 pm    acetaminophen 500 MG tablet Commonly known as: TYLENOL Take 500 mg by mouth 2 (two) times daily as needed. See other listing for scheduled dosing   amLODipine 5 MG tablet Commonly known as: NORVASC Take 5 mg by mouth daily. At 12 pm   meclizine 12.5 MG tablet Commonly known as: ANTIVERT Take 12.5 mg by mouth 2 (two) times daily as needed for dizziness.   pantoprazole 20 MG tablet Commonly known as: PROTONIX Take 20 mg by mouth daily. Between 8-11 am   Peppermint Oil 50 MG Cpdr Take 1 capsule by mouth as needed.   saccharomyces boulardii 250 MG capsule Commonly known as: Publishing copy  Take 1 capsule (250 mg total) by mouth 2 (two) times daily.   sodium bicarbonate 650 MG tablet Take 650 mg by mouth daily.        Review of Systems  Constitutional:  Negative for activity change, appetite change, chills, diaphoresis, fatigue, fever and unexpected weight change.  HENT:  Positive for hearing loss. Negative for congestion, ear discharge, ear pain and facial swelling.   Respiratory:  Negative for cough, shortness of breath and wheezing.   Cardiovascular:  Negative for chest pain, palpitations and leg swelling.  Gastrointestinal:  Negative for abdominal distention, abdominal pain, constipation and diarrhea.  Genitourinary:  Negative for difficulty urinating and dysuria.  Musculoskeletal:  Positive for arthralgias (right hip pain) and gait problem. Negative for back pain, joint swelling and myalgias.  Neurological:  Positive for weakness. Negative for dizziness, tremors, seizures, syncope, facial asymmetry, speech difficulty, light-headedness, numbness and headaches.  Psychiatric/Behavioral:  Negative for agitation, behavioral problems and confusion.        Memory loss    Vitals:   05/21/22 0948  BP: 117/73  Pulse: 81  Resp: 18  Temp: 99 F (37.2 C)  SpO2: 98%  Weight: 113 lb 6.4 oz (51.4 kg)  Height: _0  (1.499 m)   Body mass index is 22.9 kg/m. Wt Readings from Last 3  Encounters:  05/21/22 113 lb 6.4 oz (51.4 kg)  05/07/22 113 lb 6.4 oz (51.4 kg)  04/20/22 116 lb 3.2 oz (52.7 kg)    Physical Exam Vitals and nursing note reviewed.  Constitutional:      General: She is not in acute distress.    Appearance: She is not diaphoretic.  HENT:     Head: Normocephalic and atraumatic.     Right Ear: Tympanic membrane, ear canal and external ear normal.     Left Ear: Ear canal and external ear normal.     Ears:     Comments: Left TM without light reflex, dark brown in color.     Nose: Nose normal.     Mouth/Throat:     Mouth: Mucous membranes are moist.     Pharynx: Oropharynx is clear.  Eyes:     Conjunctiva/sclera: Conjunctivae normal.     Pupils: Pupils are equal, round, and reactive to light.  Neck:     Vascular: No JVD.  Cardiovascular:     Rate and Rhythm: Normal rate and regular rhythm.     Heart sounds: No murmur heard. Pulmonary:     Effort: Pulmonary effort is normal. No respiratory distress.     Breath sounds: Normal breath sounds. No wheezing.     Comments: Slight bilateral lower lobe wheeze Abdominal:     General: Abdomen is flat. Bowel sounds are normal.     Palpations: Abdomen is soft.     Tenderness: There is abdominal tenderness (RLQ).  Musculoskeletal:     Cervical back: No rigidity or tenderness.     Right lower leg: No edema.     Left lower leg: No edema.  Skin:    General: Skin is warm and dry.  Neurological:     Mental Status: She is alert and oriented to person, place, and time.  Psychiatric:        Mood and Affect: Mood normal.     Labs reviewed: Basic Metabolic Panel: Recent Labs    12/12/21 1128 01/07/22 1626 04/02/22 1151 04/29/22 0000  NA 138 135 141  --   K 5.3 5.0 5.0 3.9  CL 101 101  106 94*  CO2 _0 GLUCOSE 142* 156* 142*  --   BUN 34* 39* 38* 30*  CREATININE 2.31* 2.01* 1.88* 1.9*  CALCIUM 9.9 9.7 10.0 8.5*  MG 3.1*  --   --   --    Liver Function Tests: Recent Labs     12/12/21 1128 01/07/22 1626 04/02/22 1151 04/29/22 0000  AST _1 --   ALT _2 --   BILITOT 0.4 0.3 0.3  --   PROT 7.5 7.5 7.9  --   ALBUMIN  --   --   --  3.7   No results for input(s): "LIPASE", "AMYLASE" in the last 8760 hours. No results for input(s): "AMMONIA" in the last 8760 hours. CBC: Recent Labs    12/12/21 1128 01/07/22 1626 04/02/22 1151 04/27/22 0000 05/04/22 0000 05/11/22 0000 05/18/22 0000  WBC 9.0 13.1* 9.5   < > 8.0 8.1 6.8  NEUTROABS 4,995 8,764* 5,900  --   --   --   --   HGB 11.5* 10.6* 11.5*   < > 9.3* 8.4* 9.0*  HCT 34.7* 31.6* 35.1   < > 27* 25* 27*  MCV 93.8 92.9 92.6  --   --   --   --   PLT 198 358 245  --  104* 310 272   < > = values in this interval not displayed.   Cardiac Enzymes: No results for input(s): "CKTOTAL", "CKMB", "CKMBINDEX", "TROPONINI" in the last 8760 hours. BNP: Invalid input(s): "POCBNP" CBG: No results for input(s): "GLUCAP" in the last 8760 hours.  Procedures and Imaging Studies During Stay: No results found.  Assessment/Plan:    1. Chronic osteomyelitis of temporal bone (HCC) Currently off antibiotics due to intolerance No fever,drainage or pain at this time.  Focusing more on comfort and QOL  2. Memory loss Noted, going home with med management and home care May need AL in the future.   3. Weakness Improved with therapy  Has had home mods, ready for d/c  4. Essential hypertension Controled  5. Type 2 diabetes mellitus with stage 4 chronic kidney disease, without long-term current use of insulin (HCC) Diet controlled Goal <8  6. Hyperlipidemia associated with type 2 diabetes mellitus ([place]) Lab Results  Component Value Date   CHOL 307 (H) 08/15/2021   HDL 49 (L) 08/15/2021   LDLCALC 198 (H) 08/15/2021   TRIG 356 (H) 08/15/2021   CHOLHDL 6.3 (H) 08/15/2021     7. CKD stage 4 due to type 2 diabetes mellitus ([place]) Continue to periodically monitor BMP and avoid nephrotoxic agents Followed  by renal on sodium bicarb  8. Sensorineural hearing loss (SNHL) of both ears  9. Anemia (multifactorial) With recent infection and hx of CKD Good iron stores Heme pos stools possibly from gastric irritation Will recheck CBC at next visit Continue protonix  Would not be aggressive in working this up due to her age and goals of care.   Patient is being discharged with the following home health services:  home care   Patient is being discharged with the following durable medical equipment:  NA  Patient has been advised to f/u with their PCP in 1-2 weeks to bring them up to date on their rehab stay.  Social services at facility was responsible for arranging this appointment.  Pt was provided with a 30 day supply of prescriptions for medications and refills must be obtained from their PCP.  For controlled substances,  a more limited supply may be provided adequate until PCP appointment only.  Future labs/tests needed:  CBC

## 2022-05-26 ENCOUNTER — Non-Acute Institutional Stay (SKILLED_NURSING_FACILITY): Payer: Medicare Other | Admitting: Orthopedic Surgery

## 2022-05-26 ENCOUNTER — Encounter: Payer: Self-pay | Admitting: Orthopedic Surgery

## 2022-05-26 DIAGNOSIS — I10 Essential (primary) hypertension: Secondary | ICD-10-CM

## 2022-05-26 DIAGNOSIS — H903 Sensorineural hearing loss, bilateral: Secondary | ICD-10-CM

## 2022-05-26 DIAGNOSIS — E785 Hyperlipidemia, unspecified: Secondary | ICD-10-CM

## 2022-05-26 DIAGNOSIS — Z66 Do not resuscitate: Secondary | ICD-10-CM

## 2022-05-26 DIAGNOSIS — D631 Anemia in chronic kidney disease: Secondary | ICD-10-CM

## 2022-05-26 DIAGNOSIS — R42 Dizziness and giddiness: Secondary | ICD-10-CM | POA: Diagnosis not present

## 2022-05-26 DIAGNOSIS — R531 Weakness: Secondary | ICD-10-CM | POA: Diagnosis not present

## 2022-05-26 DIAGNOSIS — E1169 Type 2 diabetes mellitus with other specified complication: Secondary | ICD-10-CM

## 2022-05-26 DIAGNOSIS — M8668 Other chronic osteomyelitis, other site: Secondary | ICD-10-CM | POA: Diagnosis not present

## 2022-05-26 DIAGNOSIS — N184 Chronic kidney disease, stage 4 (severe): Secondary | ICD-10-CM

## 2022-05-26 DIAGNOSIS — R413 Other amnesia: Secondary | ICD-10-CM | POA: Diagnosis not present

## 2022-05-26 DIAGNOSIS — E1122 Type 2 diabetes mellitus with diabetic chronic kidney disease: Secondary | ICD-10-CM

## 2022-05-26 LAB — CBC AND DIFFERENTIAL
HCT: 30 — AB (ref 36–46)
Hemoglobin: 9.9 — AB (ref 12.0–16.0)
Platelets: 168 10*3/uL (ref 150–400)
WBC: 6.8

## 2022-05-26 LAB — CBC: RBC: 3.21 — AB (ref 3.87–5.11)

## 2022-05-26 NOTE — Progress Notes (Unsigned)
Location:  [facility] Room Number: 151/A Place of Service:  SNF (31) Provider: Yvonna Alanis, NP  Patient Care Team: Virgie Dad, MD as PCP - General (Internal Medicine) Gaynelle Arabian, MD as Consulting Physician (Orthopedic Surgery) Harrie Foreman, [place] as Referring Physician (Optometry) Teena Irani, MD (Inactive) as Consulting Physician (Gastroenterology) Suella Broad, MD as Consulting Physician (Physical Medicine and Rehabilitation) Almyra Deforest, [place] as Consulting Physician (Cardiology) Leta Baptist, MD as Consulting Physician (Otolaryngology) Elisha Ponder, MD as Resident (Otolaryngology)  Extended Emergency Contact Information Primary Emergency Contact: Neva Seat [place] Phone: [phone] Mobile Phone: [phone] Relation: Daughter Secondary Emergency Contact: Cristela Blue [place] Mobile Phone: [phone] Relation: Son  Code Status:  Full code Goals of care: Advanced Directive information    05/26/2022   10:49 AM  Advanced Directives  Does Patient Have a Medical Advance Directive? Yes  Type of Paramedic of [place];Living will  Does patient want to make changes to medical advance directive? No - Patient declined  Copy of [place] in Chart? Yes - validated most recent copy scanned in chart (See row information)     Chief Complaint  Patient presents with   Acute Visit    Dizziness     HPI:  Pt is a 88 y.o. female seen today for an acute visit for dizziness.   04/2022 admitted to rehabilitation unit at [facility] due to weakness, nausea, left otitis and osteomyelitis of temporal bone. Followed by I/D and ENT, she was found to have left ear canal polyp. She did not respond to antibiotics. Infection did not improve with debridement. 02/2021 CT revealed erosion to mastoid plate. Biopsy was performed- coagulase-negative staph and  corynebacterium species. 03/2021 MRI confirmed left otitis externa, mastoiditis osteomyelitis. She was on Cipro, Zyvox and Flagyl, but stopped due to nausea and diarrhea. She continues to see Dr. Megan Salon, not on antibiotics at this time. She was discharged back to [facility] apartment 05/21/2022. While there she c/o increased weakness and dizziness. No falls or injuries reported. 07/17 she was evaluated by nursing staff and recommended readmission to rehab unit.   Today, she reports dizziness when changing positions. She also feels weak and cannot ambulate long distances. Reports having difficulty getting dressed in apartment- " took all her energy."  Hgb 9.0, Iron 41, ferritin 120 05/18/2022. 3 positive hemoccults during last rehab stay. H/o CKD IV. She is taking Protonix 20 mg daily. She has discussed moving to AL/SNF with children. Code status also discussed with patient, she would like to be DNR. She would like to complete MOST form when daughter is present.   Cbc/diff drawn this morning- pending. PT/OT ordered. Ambulating with walker.     Past Medical History:  Diagnosis Date   Arthritis    HLD (hyperlipidemia)    HTN (hypertension)    echo (11/11) with EF 55-60%, mild LV hypertrophy, PA systolic pressure 34 mmHg   Low back pain    Pre-diabetes    diet controlled   Unspecified vitamin D deficiency    Past Surgical History:  Procedure Laterality Date   ABDOMINAL HYSTERECTOMY     partial   abnormal EKG     BACK SURGERY     cataract surgery     colonoscopsy  2008   Neg  Dr Amedeo Plenty   EYE SURGERY Bilateral    cataract   JOINT REPLACEMENT     left hip replacement , right knee  replacement    lexiscan myoview  11/11   EF 81%, normal perfusion images suggesting no ischemia or infarction   SPINE SURGERY     lumbar   surgery for DDD     TOTAL HIP ARTHROPLASTY  12/02/2012   Procedure: TOTAL HIP ARTHROPLASTY;  Surgeon: Gearlean Alf, MD;  Location: [facility] ORS;  Service: Orthopedics;  Laterality:  Right;    No Known Allergies  Outpatient Encounter Medications as of 05/26/2022  Medication Sig   acetaminophen (TYLENOL) 500 MG tablet Take 500 mg by mouth 2 (two) times daily. Between 8-11 am and 4-7 pm   acetaminophen (TYLENOL) 500 MG tablet Take 500 mg by mouth 2 (two) times daily as needed. See other listing for scheduled dosing   amLODipine (NORVASC) 5 MG tablet Take 5 mg by mouth daily. At 12 pm   nystatin powder Apply 1 Application topically 2 (two) times daily. To reddened areas (buttocks), if not healed after 7 days notify MD for further orders   pantoprazole (PROTONIX) 20 MG tablet Take 20 mg by mouth daily. Between 8-11 am   sodium bicarbonate 650 MG tablet Take 650 mg by mouth daily.   No facility-administered encounter medications on file as of 05/26/2022.    Review of Systems  Constitutional:  Positive for fatigue. Negative for activity change, appetite change, chills and fever.  HENT:  Positive for hearing loss. Negative for ear discharge, ear pain and trouble swallowing.   Eyes:  Negative for visual disturbance.  Respiratory:  Negative for cough, shortness of breath and wheezing.   Cardiovascular:  Negative for chest pain and leg swelling.  Gastrointestinal:  Negative for abdominal distention, abdominal pain, constipation, diarrhea, nausea and vomiting.  Genitourinary:  Negative for dysuria, frequency and hematuria.  Musculoskeletal:  Positive for arthralgias and gait problem.  Skin:  Negative for wound.  Neurological:  Positive for dizziness, weakness and light-headedness. Negative for syncope and headaches.  Psychiatric/Behavioral:  Positive for confusion. Negative for dysphoric mood and sleep disturbance. The patient is not nervous/anxious.     Immunization History  Administered Date(s) Administered   Influenza Split 10/11/2013   Influenza, High Dose Seasonal PF 09/18/2014, 09/20/2015, 08/04/2017, 09/16/2018, 10/16/2019, 08/15/2021   Influenza, Seasonal, Injecte,  Preservative Fre 10/15/2016   Influenza-Unspecified 10/10/2012, 08/09/2020   Moderna Sars-Covid-2 Vaccination 11/27/2019, 12/28/2019, 09/19/2020   Pneumococcal Conjugate-13 12/19/2014   Pneumococcal Polysaccharide-23 10/11/2013   Tdap 09/21/2012   Pertinent  Health Maintenance Due  Topic Date Due   FOOT EXAM  05/14/2022   INFLUENZA VACCINE  06/09/2022   HEMOGLOBIN A1C  06/11/2022   URINE MICROALBUMIN  08/15/2022   OPHTHALMOLOGY EXAM  10/09/2022   DEXA SCAN  Discontinued      12/11/2020   11:57 AM 05/14/2021   10:07 PM 12/12/2021   11:01 AM 04/08/2022   10:07 AM 04/15/2022    8:33 AM  Fall Risk  Falls in the past year? 0 0 0 1 1  Was there an injury with Fall? 0  0 1 1  Fall Risk Category Calculator 0  0 2 2  Fall Risk Category Low  Low Moderate Moderate  Patient Fall Risk Level Moderate fall risk  Moderate fall risk  High fall risk  Patient at Risk for Falls Due to Impaired mobility;Impaired balance/gait;History of fall(s) No Fall Risks History of fall(s);Impaired mobility;Impaired balance/gait History of fall(s) History of fall(s);Impaired balance/gait;Impaired mobility  Fall risk Follow up Falls evaluation completed;Education provided;Falls prevention discussed Falls evaluation completed;Education provided;Falls prevention discussed Falls prevention discussed;Falls  evaluation completed;Education provided Falls evaluation completed Falls evaluation completed  Fall risk Follow up - Comments   Declines PT this visit     Functional Status Survey:    Vitals:   05/26/22 1046  BP: (!) 146/75  Pulse: 92  Resp: 16  Temp: (!) 97.5 F (36.4 C)  SpO2: 97%  Weight: 112 lb 9.6 oz (51.1 kg)  Height: 4\' 11"  (1.499 m)   Body mass index is 22.74 kg/m. Physical Exam Vitals reviewed.  Constitutional:      General: She is not in acute distress. HENT:     Head: Normocephalic.     Right Ear: There is no impacted cerumen.     Left Ear: There is no impacted cerumen.     Ears:     Comments:  TM brown in color    Nose: Nose normal.     Mouth/Throat:     Mouth: Mucous membranes are moist.  Eyes:     General:        Right eye: No discharge.        Left eye: No discharge.  Cardiovascular:     Rate and Rhythm: Normal rate and regular rhythm.     Pulses: Normal pulses.     Heart sounds: Normal heart sounds.  Pulmonary:     Effort: Pulmonary effort is normal. No respiratory distress.     Breath sounds: Normal breath sounds. No wheezing.  Abdominal:     General: Bowel sounds are normal. There is no distension.     Palpations: Abdomen is soft.     Tenderness: There is no abdominal tenderness.  Musculoskeletal:     Cervical back: Neck supple.     Right lower leg: No edema.     Left lower leg: No edema.  Skin:    General: Skin is warm and dry.     Capillary Refill: Capillary refill takes less than 2 seconds.  Neurological:     General: No focal deficit present.     Mental Status: She is alert and oriented to person, place, and time.     Motor: Weakness present.     Gait: Gait abnormal.     Comments: walker  Psychiatric:        Mood and Affect: Mood normal.        Behavior: Behavior normal.     Comments: Very pleasant, follows commands, alert to self/person/situation     Labs reviewed: Recent Labs    12/12/21 1128 01/07/22 1626 04/02/22 1151 04/29/22 0000  NA 138 135 141  --   K 5.3 5.0 5.0 3.9  CL 101 101 106 94*  CO2 28 21 23 16   GLUCOSE 142* 156* 142*  --   BUN 34* 39* 38* 30*  CREATININE 2.31* 2.01* 1.88* 1.9*  CALCIUM 9.9 9.7 10.0 8.5*  MG 3.1*  --   --   --    Recent Labs    12/12/21 1128 01/07/22 1626 04/02/22 1151 04/29/22 0000  AST 14 12 13   --   ALT 9 11 10   --   BILITOT 0.4 0.3 0.3  --   PROT 7.5 7.5 7.9  --   ALBUMIN  --   --   --  3.7   Recent Labs    12/12/21 1128 01/07/22 1626 04/02/22 1151 04/27/22 0000 05/04/22 0000 05/11/22 0000 05/18/22 0000  WBC 9.0 13.1* 9.5   < > 8.0 8.1 6.8  NEUTROABS 4,995 8,764* 5,900  --   --    --   --  HGB 11.5* 10.6* 11.5*   < > 9.3* 8.4* 9.0*  HCT 34.7* 31.6* 35.1   < > 27* 25* 27*  MCV 93.8 92.9 92.6  --   --   --   --   PLT 198 358 245  --  104* 310 272   < > = values in this interval not displayed.   Lab Results  Component Value Date   TSH 2.22 12/12/2021   Lab Results  Component Value Date   HGBA1C 6.5 (H) 12/12/2021   Lab Results  Component Value Date   CHOL 307 (H) 08/15/2021   HDL 49 (L) 08/15/2021   LDLCALC 198 (H) 08/15/2021   TRIG 356 (H) 08/15/2021   CHOLHDL 6.3 (H) 08/15/2021    Significant Diagnostic Results in last 30 days:  No results found.  Assessment/Plan 1. Dizziness - suspect due to deconditioning, advanced age, low blood counts - orthostatic bp 07/18: lying 151/76, sitting 147/82, standing 167/71 - hgb 9.0, ferritin 120, iron 41 05/18/2022 - cbc/diff - pending - orthostatic blood pressure daily x 2 days  2. Weakness - see above - considering AL or skilled with family - cont PT/OT  3. Chronic osteomyelitis of temporal bone (HCC) - followed by Dr. Megan Salon and Dr. Benjamine Mola - not on antibiotics at this time  4. Memory loss - appropriate today - no behaviors  5. Essential hypertension - controlled with amlodipine  6. Type 2 diabetes mellitus with stage 4 chronic kidney disease, without long-term current use of insulin (HCC) - A1c 6.5 12/12/2021 - diet controlled  7. Hyperlipidemia associated with type 2 diabetes mellitus (HCC) - LDL 198 08/15/2022 - off statin  8. CKD stage 4 due to type 2 diabetes mellitus ([place]) - BUN/creat 30/1.9 04/29/2022 - GFR 25 04/02/2022 - followed by [facility] sodium bicarbonate  9. Sensorineural hearing loss (SNHL) of both ears  10. Anemia due to stage 4 chronic kidney disease ([place]) - see above - cbc/diff pending - cont Protonix  11. Do not resuscitate - MOST form to be done when daughter present    Family/ staff Communication: plan discussed with patient and  nurse  Labs/tests ordered:  cbc/diff- pending

## 2022-05-28 ENCOUNTER — Encounter: Payer: Self-pay | Admitting: Orthopedic Surgery

## 2022-06-01 ENCOUNTER — Non-Acute Institutional Stay (SKILLED_NURSING_FACILITY): Payer: Medicare Other | Admitting: Internal Medicine

## 2022-06-01 ENCOUNTER — Encounter: Payer: Self-pay | Admitting: Internal Medicine

## 2022-06-01 DIAGNOSIS — R42 Dizziness and giddiness: Secondary | ICD-10-CM | POA: Diagnosis not present

## 2022-06-01 DIAGNOSIS — R531 Weakness: Secondary | ICD-10-CM

## 2022-06-01 DIAGNOSIS — D631 Anemia in chronic kidney disease: Secondary | ICD-10-CM

## 2022-06-01 DIAGNOSIS — M25551 Pain in right hip: Secondary | ICD-10-CM

## 2022-06-01 DIAGNOSIS — E1122 Type 2 diabetes mellitus with diabetic chronic kidney disease: Secondary | ICD-10-CM

## 2022-06-01 DIAGNOSIS — I10 Essential (primary) hypertension: Secondary | ICD-10-CM | POA: Diagnosis not present

## 2022-06-01 DIAGNOSIS — N184 Chronic kidney disease, stage 4 (severe): Secondary | ICD-10-CM

## 2022-06-01 DIAGNOSIS — E1169 Type 2 diabetes mellitus with other specified complication: Secondary | ICD-10-CM

## 2022-06-01 DIAGNOSIS — E785 Hyperlipidemia, unspecified: Secondary | ICD-10-CM

## 2022-06-01 DIAGNOSIS — M8668 Other chronic osteomyelitis, other site: Secondary | ICD-10-CM

## 2022-06-01 NOTE — Progress Notes (Signed)
Provider:  Veleta Miners MD  Location:    [facility] Room Number: [id number] Place of Service:  SNF ([phone])  PCP: Virgie Dad, MD Patient Care Team: Virgie Dad, MD as PCP - General (Internal Medicine) Gaynelle Arabian, MD as Consulting Physician (Orthopedic Surgery) Harrie Foreman, [place] as Referring Physician (Optometry) Teena Irani, MD (Inactive) as Consulting Physician (Gastroenterology) Suella Broad, MD as Consulting Physician (Physical Medicine and Rehabilitation) Almyra Deforest, [place] as Consulting Physician (Cardiology) Leta Baptist, MD as Consulting Physician (Otolaryngology) Elisha Ponder, MD as Resident (Otolaryngology)  Extended Emergency Contact Information Primary Emergency Contact: Neva Seat [place] of [place] Phone: [phone] Mobile Phone: [phone] Relation: Daughter Secondary Emergency Contact: Cristela Blue [place] of [place] Mobile Phone: [phone] Relation: Son  Code Status: DNR Goals of Care: Advanced Directive information    06/01/2022   10:58 AM  Advanced Directives  Does Patient Have a Medical Advance Directive? Yes  Type of Paramedic of [place];Living will  Does patient want to make changes to medical advance directive? No - Patient declined  Copy of [place] in Chart? Yes - validated most recent copy scanned in chart (See row information)      Chief Complaint  Patient presents with   Readmit To SNF    Readmission to Rehab.    HPI: Patient is a 88 y.o. female seen today for Readmission to Rehab  Patient has h/o Hypertension, HLD, Vit D def, Type 2 diabetes, CKD stage 4    Also Recently  MRI active left otitis externa, otitis media, mastoiditis, and surrounding petrous temporal bone osteomyelitis Was on empiric Antibiotics but were stopped before she could finish the course due to Side effects She was discharged home but came back as she  felt weak and Dizzy possibly due to Deconditioning  Since then in rehab she is doing really well Doing her ADLS with mild assists Walking with her walker No Dizziness No Nausea or Vomiting Did have some right Hip pain but it is chronic No Falls or recent worsening Tylenol helping   Past Medical History:  Diagnosis Date   Arthritis    HLD (hyperlipidemia)    HTN (hypertension)    echo (11/11) with EF 55-60%, mild LV hypertrophy, PA systolic pressure 34 mmHg   Low back pain    Pre-diabetes    diet controlled   Unspecified vitamin D deficiency    Past Surgical History:  Procedure Laterality Date   ABDOMINAL HYSTERECTOMY     partial   abnormal EKG     BACK SURGERY     cataract surgery     colonoscopsy  2008   Neg  Dr Amedeo Plenty   EYE SURGERY Bilateral    cataract   JOINT REPLACEMENT     left hip replacement , right knee replacement    lexiscan myoview  11/11   EF 81%, normal perfusion images suggesting no ischemia or infarction   SPINE SURGERY     lumbar   surgery for DDD     TOTAL HIP ARTHROPLASTY  12/02/2012   Procedure: TOTAL HIP ARTHROPLASTY;  Surgeon: Gearlean Alf, MD;  Location: [facility];  Service: Orthopedics;  Laterality: Right;    reports that she quit smoking about 51 years ago. Her smoking use included cigarettes. She has never used smokeless tobacco. She reports current alcohol use. She reports that she does not use drugs. Social History   Socioeconomic History   Marital status:  Single    Spouse name: Not on file   Number of children: Not on file   Years of education: Not on file   Highest education level: Not on file  Occupational History   Not on file  Tobacco Use   Smoking status: Former    Types: Cigarettes    Quit date: 11/09/1970    Years since quitting: 51.5   Smokeless tobacco: Never  Vaping Use   Vaping Use: Not on file  Substance and Sexual Activity   Alcohol use: Yes    Comment: occasional    Drug use: No   Sexual activity: Not on file   Other Topics Concern   Not on file  Social History Narrative   Retired, lives in [place]. Widowed   Daughter in [place]. [name] [phone])   Does not get regular exercise   Social Determinants of Health   Financial Resource Strain: Not on file  Food Insecurity: Not on file  Transportation Needs: Not on file  Physical Activity: Not on file  Stress: Not on file  Social Connections: Not on file  Intimate Partner Violence: Not on file    Functional Status Survey:    Family History  Problem Relation Age of Onset   Heart disease Mother    Diabetes Other        DM - sibiling    Coronary artery disease Neg Hx        no premature CAd     Health Maintenance  Topic Date Due   Zoster Vaccines- Shingrix (1 of 2) Never done   COVID-19 Vaccine (4 - Booster for Moderna series) 11/14/2020   FOOT EXAM  05/14/2022   INFLUENZA VACCINE  06/09/2022   HEMOGLOBIN A1C  06/11/2022   URINE MICROALBUMIN  08/15/2022   TETANUS/TDAP  09/21/2022   OPHTHALMOLOGY EXAM  10/09/2022   Pneumonia Vaccine 55+ Years old  Completed   HPV VACCINES  Aged Out   DEXA SCAN  Discontinued    No Known Allergies  Allergies as of 06/01/2022   No Known Allergies      Medication List        Accurate as of June 01, 2022 10:59 AM. If you have any questions, ask your nurse or doctor.          acetaminophen 500 MG tablet Commonly known as: TYLENOL Take 500 mg by mouth 2 (two) times daily. Between 8-11 am and 4-7 pm   acetaminophen 500 MG tablet Commonly known as: TYLENOL Take 500 mg by mouth 2 (two) times daily as needed. See other listing for scheduled dosing   amLODipine 5 MG tablet Commonly known as: NORVASC Take 5 mg by mouth daily. At 12 pm   nystatin powder Generic drug: nystatin Apply 1 Application topically 2 (two) times daily. To reddened areas (buttocks), if not healed after 7 days notify MD for further orders   pantoprazole 20 MG tablet Commonly known as: PROTONIX Take 20 mg by mouth  daily. Between 8-11 am   sodium bicarbonate 650 MG tablet Take 650 mg by mouth daily.        Review of Systems  Constitutional:  Negative for activity change and appetite change.  HENT: Negative.    Respiratory:  Negative for cough and shortness of breath.   Cardiovascular:  Negative for leg swelling.  Gastrointestinal:  Negative for constipation.  Genitourinary: Negative.   Musculoskeletal:  Positive for arthralgias. Negative for gait problem and myalgias.  Skin: Negative.   Neurological:  Negative  for dizziness and weakness.  Psychiatric/Behavioral:  Negative for confusion, dysphoric mood and sleep disturbance.     Vitals:   06/01/22 1044  BP: 126/60  Pulse: 78  Resp: 14  Temp: 98.2 F (36.8 C)  SpO2: 96%  Weight: 112 lb 9.6 oz (51.1 kg)  Height: 4\' 11"  (1.499 m)   Body mass index is 22.74 kg/m. Physical Exam Vitals reviewed.  Constitutional:      Appearance: Normal appearance.  HENT:     Head: Normocephalic.     Nose: Nose normal.     Mouth/Throat:     Mouth: Mucous membranes are moist.     Pharynx: Oropharynx is clear.  Eyes:     Pupils: Pupils are equal, round, and reactive to light.  Cardiovascular:     Rate and Rhythm: Normal rate and regular rhythm.     Pulses: Normal pulses.     Heart sounds: Normal heart sounds. No murmur heard. Pulmonary:     Effort: Pulmonary effort is normal.     Breath sounds: Normal breath sounds.  Abdominal:     General: Abdomen is flat. Bowel sounds are normal.     Palpations: Abdomen is soft.  Musculoskeletal:        General: No swelling.     Cervical back: Neck supple.     Comments: Hip Exam was normal No Pain Gait stable with walker No Dizziness  Skin:    General: Skin is warm.  Neurological:     General: No focal deficit present.     Mental Status: She is alert and oriented to person, place, and time.  Psychiatric:        Mood and Affect: Mood normal.        Thought Content: Thought content normal.      Labs reviewed: Basic Metabolic Panel: Recent Labs    12/12/21 1128 01/07/22 1626 04/02/22 1151 04/29/22 0000  NA 138 135 141  --   K 5.3 5.0 5.0 3.9  CL 101 101 106 94*  CO2 28 21 23 16   GLUCOSE 142* 156* 142*  --   BUN 34* 39* 38* 30*  CREATININE 2.31* 2.01* 1.88* 1.9*  CALCIUM 9.9 9.7 10.0 8.5*  MG 3.1*  --   --   --    Liver Function Tests: Recent Labs    12/12/21 1128 01/07/22 1626 04/02/22 1151 04/29/22 0000  AST 14 12 13   --   ALT 9 11 10   --   BILITOT 0.4 0.3 0.3  --   PROT 7.5 7.5 7.9  --   ALBUMIN  --   --   --  3.7   No results for input(s): "LIPASE", "AMYLASE" in the last 8760 hours. No results for input(s): "AMMONIA" in the last 8760 hours. CBC: Recent Labs    12/12/21 1128 01/07/22 1626 04/02/22 1151 04/27/22 0000 05/11/22 0000 05/18/22 0000 05/26/22 0000  WBC 9.0 13.1* 9.5   < > 8.1 6.8 6.8  NEUTROABS 4,995 8,764* 5,900  --   --   --   --   HGB 11.5* 10.6* 11.5*   < > 8.4* 9.0* 9.9*  HCT 34.7* 31.6* 35.1   < > 25* 27* 30*  MCV 93.8 92.9 92.6  --   --   --   --   PLT 198 358 245   < > 310 272 168   < > = values in this interval not displayed.   Cardiac Enzymes: No results for input(s): "CKTOTAL", "CKMB", "CKMBINDEX", "TROPONINI"  in the last 8760 hours. BNP: Invalid input(s): "POCBNP" Lab Results  Component Value Date   HGBA1C 6.5 (H) 12/12/2021   Lab Results  Component Value Date   TSH 2.22 12/12/2021   No results found for: "VITAMINB12" No results found for: "FOLATE" Lab Results  Component Value Date   IRON 41 05/18/2022   TIBC 328 04/02/2022   FERRITIN 120.30 05/18/2022    Imaging and Procedures obtained prior to SNF admission: CT TEMPORAL BONES WO CONTRAST  Addendum Date: 02/27/2022   ADDENDUM REPORT: 02/27/2022 11:21 ADDENDUM: Study discussed by telephone with Dr. Leta Baptist on 02/27/2022 at 11:11 . I discussed the left temporal bone findings with Dr. Benjamine Mola, including the left mastoid erosion at the sigmoid plate. He  advises that the patient has had a chronic left ear infection, not responding to antibiotics. And furthermore, he advised on the phone that the opacification of the left EAC clinically is due to an obstructing "polyp", which was indeterminate on physical inspection although he took some fragments of the lesion in his office and might be able to sent for histology. Although the majority of the left EAC is intact, I do now identify one suspicious area of erosion along the anterior inferior mastoid (series 3, image 42) opposite the sigmoid plate erosion, which could be in continuity with the posteroinferior margin of the "polyp" (opacified EAC). Electronically Signed   By: Genevie Ann M.D.   On: 02/27/2022 11:21   Result Date: 02/27/2022 CLINICAL DATA:  88 year old female new onset hearing loss. EXAM: CT TEMPORAL BONES WITHOUT CONTRAST TECHNIQUE: Axial and coronal plane CT imaging of the petrous temporal bones was performed with thin-collimation image reconstruction. No intravenous contrast was administered. Multiplanar CT image reconstructions were also generated. RADIATION DOSE REDUCTION: This exam was performed according to the departmental dose-optimization program which includes automated exposure control, adjustment of the mA and/or kV according to patient size and/or use of iterative reconstruction technique. COMPARISON:  No pertinent prior exam. FINDINGS: RIGHT TEMPORAL BONE Mild debris in the right external auditory canal (series 9, image 83) which otherwise appears normal. Right tympanic membrane remains within normal limits. Right tympanic cavity is clear. Scutum and ossicles are intact and aligned. Right mastoid antrum is clear. Majority of the right mastoid air cells are clear. And right petrous apex air cells are clear. But posteroinferior right mastoid air cells are opacified (series 7, image 42). No associated mastoid coalescence or bone erosion identified. Mildly high riding right IJ bulb with intact  overlying bony covering (series 7, image 49 and series 9, image 92. Right IAC, cochlea, vestibule and vestibular aqueduct are within normal limits. Course of the right 7th nerve remains normal. Right semicircular canals remain within normal limits. LEFT TEMPORAL BONE Subtotal opacification of the right external auditory canal (series 10, image 71). A portion of the EAC near the tympanic membrane remains pneumatized. But tympanic membrane detail is obscured. No definite EAC bony erosion. Completely opacified left tympanic cavity. But the left scutum appears partially eroded (series 10, image 71). However, the left ossicles appear to remain intact and aligned. Left mastoid antrum and air cells are completely opacified. However, no mastoid coalescence is evident. Still, there is some dehiscence of the left sigmoid plate (series 8, image 43 and series 10, image 103). Left IJ bulb appears to remain intact and is not high riding. Left internal auditory canal, cochlea, vestibule and vestibular aqueduct are within normal limits. Left semicircular canals remain within normal limits. Much of the course  of the left facial nerve is opacified. Left stylomastoid foramen appears to remain normal. Vascular: Calcified atherosclerosis at the skull base. Limited intracranial: Negative for age visible noncontrast brain parenchyma. Visible orbits/paranasal sinuses: Chronic postoperative changes to both globes. Generally well aerated and somewhat hyperplastic paranasal sinuses. There is mild to moderate mucosal thickening in the inferolateral component of hyperplastic right sphenoid sinus. No sinus fluid levels. In general there is osteopenia throughout the skull base. Soft tissues: Negative visible noncontrast deep soft tissue spaces of the face. IMPRESSION: 1. Subtotally opacified left external auditory canal with completely opacified left tympanic cavity and mastoids. No obvious ossicle erosion or mastoid coalescence, but both a  portion of the left scutum and the left sigmoid venous canal appear partially eroded: The latter places the patient at risk for complication such as sigmoid venous sinus thrombosis. 2. Mild debris in the right external auditory canal and mild right mastoid effusion. But otherwise negative right temporal bone. 3. Hyperplastic paranasal sinuses with mild to moderate mucosal thickening in the right sphenoid. Electronically Signed: By: Genevie Ann M.D. On: 02/27/2022 10:57    Assessment/Plan 1. Dizziness Resolved with PO Hydration  2. Weakness Doing well in Rehab Walking with Mild assist and doing her ADLS   3. Chronic osteomyelitis of temporal bone (HCC) COuld not finish her antibiotics course due to Side effects Does have hearing loss in that ear Denies any pain or discharge or dizziness   4. Essential hypertension On Low dose of Norvasc  5. Type 2 diabetes mellitus with stage 4 chronic kidney disease, without long-term current use of insulin (HCC) Diet Control A1c 6.5 in 02/23  6. CKD stage 4 due to type 2 diabetes mellitus (HCC) Creat stable Follows with Renal   7 Anemia due to stage 4 chronic kidney disease (HCC) Hgb is stable Was Guaiac positive before On Protonix No Further work up right now unless she drops her HGB  8 Right hip pain Chronic Tylenol helping S/p Arthroplasty If pain worsen conside rimaging . 9 GERD On Protonix   Family/ staff Communication:   Labs/tests ordered:

## 2022-06-08 LAB — BASIC METABOLIC PANEL
BUN: 29 — AB (ref 4–21)
CO2: 21 (ref 13–22)
Chloride: 102 (ref 99–108)
Creatinine: 2 — AB (ref 0.5–1.1)
Glucose: 98
Potassium: 4.5 mEq/L (ref 3.5–5.1)
Sodium: 138 (ref 137–147)

## 2022-06-08 LAB — CBC AND DIFFERENTIAL
HCT: 32 — AB (ref 36–46)
Hemoglobin: 10.9 — AB (ref 12.0–16.0)
Platelets: 192 10*3/uL (ref 150–400)
WBC: 8.7

## 2022-06-08 LAB — CBC: RBC: 3.52 — AB (ref 3.87–5.11)

## 2022-06-08 LAB — COMPREHENSIVE METABOLIC PANEL: Calcium: 9.3 (ref 8.7–10.7)

## 2022-07-06 ENCOUNTER — Non-Acute Institutional Stay: Payer: Medicare Other | Admitting: Adult Health

## 2022-07-06 ENCOUNTER — Encounter: Payer: Self-pay | Admitting: Adult Health

## 2022-07-06 VITALS — BP 134/76 | HR 93 | Temp 97.7°F | Ht 59.0 in | Wt 122.0 lb

## 2022-07-06 DIAGNOSIS — H6123 Impacted cerumen, bilateral: Secondary | ICD-10-CM

## 2022-07-06 DIAGNOSIS — H903 Sensorineural hearing loss, bilateral: Secondary | ICD-10-CM

## 2022-07-06 DIAGNOSIS — E1122 Type 2 diabetes mellitus with diabetic chronic kidney disease: Secondary | ICD-10-CM

## 2022-07-06 DIAGNOSIS — E1169 Type 2 diabetes mellitus with other specified complication: Secondary | ICD-10-CM

## 2022-07-06 DIAGNOSIS — M160 Bilateral primary osteoarthritis of hip: Secondary | ICD-10-CM

## 2022-07-06 DIAGNOSIS — M8668 Other chronic osteomyelitis, other site: Secondary | ICD-10-CM

## 2022-07-06 DIAGNOSIS — I10 Essential (primary) hypertension: Secondary | ICD-10-CM

## 2022-07-06 DIAGNOSIS — E785 Hyperlipidemia, unspecified: Secondary | ICD-10-CM

## 2022-07-06 DIAGNOSIS — R195 Other fecal abnormalities: Secondary | ICD-10-CM

## 2022-07-06 DIAGNOSIS — N184 Chronic kidney disease, stage 4 (severe): Secondary | ICD-10-CM

## 2022-07-06 NOTE — Progress Notes (Signed)
Location:  [facility]  POS: Clinic  Provider: Royal Hawthorn, ANP  Code Status: DNR Goals of Care:     07/06/2022    2:09 PM  Advanced Directives  Does Patient Have a Medical Advance Directive? Yes  Type of Paramedic of [place];Out of facility DNR (pink MOST or yellow form);Living will  Does patient want to make changes to medical advance directive? No - Patient declined     Chief Complaint  Patient presents with   Medical Management of Chronic Issues    Rehab follow up.    HPI: Patient is a 88 y.o. female seen today for medical management of chronic diseases.   Seen for follow up after moving to assisted living. PMH significant for HTN, anemia with heme pos stool, Vit D def, Type 2 DM, CKD stage 4, and HLD. Also has a hx of temporal bone osteomyelitis and chronic right hip pain.   She had an MRI in May of 2023 which showed active left otitis externa, mastoiditis, osteomyelitis, and inflammation. Treated by ID with zyvox, cipro, and flagyl.  Off antibiotics due to GI side effects for temporal bone osteomyelitis.  Had heme pos stool with anemia July of 2023. Placed on protonix.  05/10/22 Hgb 8.2, 06/08/22 Hgb 10.9  06/08/22: CKD 4 : BUN 29 Cr 2.0 C02 21 on sodium bicarb followed by nephrology   She says she is adjusting to living in the assisted living area. Denies any fever or ear pain. Reports chronic right hip pain relieved with tylenol Has significant hearing loss. Deaf in left ear and decreased in right ear. No ear pain, fever or drainage.  Able to walk with walker.  Has memory loss MMSE 06/27/22 27/30 passed clock  Past Medical History:  Diagnosis Date   Arthritis    HLD (hyperlipidemia)    HTN (hypertension)    echo (11/11) with EF 55-60%, mild LV hypertrophy, PA systolic pressure 34 mmHg   Low back pain    Pre-diabetes    diet controlled   Unspecified vitamin D deficiency     Past Surgical History:  Procedure Laterality Date    ABDOMINAL HYSTERECTOMY     partial   abnormal EKG     BACK SURGERY     cataract surgery     colonoscopsy  2008   Neg  Dr Amedeo Plenty   EYE SURGERY Bilateral    cataract   JOINT REPLACEMENT     left hip replacement , right knee replacement    lexiscan myoview  11/11   EF 81%, normal perfusion images suggesting no ischemia or infarction   SPINE SURGERY     lumbar   surgery for DDD     TOTAL HIP ARTHROPLASTY  12/02/2012   Procedure: TOTAL HIP ARTHROPLASTY;  Surgeon: Gearlean Alf, MD;  Location: [facility];  Service: Orthopedics;  Laterality: Right;    No Known Allergies  Outpatient Encounter Medications as of 07/06/2022  Medication Sig   acetaminophen (TYLENOL) 500 MG tablet Take 500 mg by mouth 2 (two) times daily. Between 8-11 am and 4-7 pm   acetaminophen (TYLENOL) 500 MG tablet Take 500 mg by mouth 2 (two) times daily as needed. See other listing for scheduled dosing   amLODipine (NORVASC) 5 MG tablet Take 5 mg by mouth daily. At 12 pm   pantoprazole (PROTONIX) 20 MG tablet Take 20 mg by mouth daily. Between 8-11 am   sodium bicarbonate 650 MG tablet Take 650 mg by mouth daily.   [  DISCONTINUED] meclizine (ANTIVERT) 12.5 MG tablet Take 12.5 mg by mouth 2 (two) times daily as needed for dizziness.   [DISCONTINUED] nystatin powder Apply 1 Application topically 2 (two) times daily. To reddened areas (buttocks), if not healed after 7 days notify MD for further orders   [DISCONTINUED] saccharomyces boulardii (FLORASTOR) 250 MG capsule Take 250 mg by mouth 2 (two) times daily.   [DISCONTINUED] sodium bicarbonate 650 MG tablet Take 650 mg by mouth daily.   No facility-administered encounter medications on file as of 07/06/2022.    Review of Systems:  Review of Systems  Constitutional:  Negative for activity change, appetite change, chills, diaphoresis, fatigue, fever and unexpected weight change.  HENT:  Positive for hearing loss. Negative for congestion, postnasal drip, rhinorrhea, sinus  pressure and sore throat.   Respiratory:  Negative for cough, shortness of breath and wheezing.   Cardiovascular:  Negative for chest pain, palpitations and leg swelling.  Gastrointestinal:  Negative for abdominal distention, abdominal pain, constipation and diarrhea.  Genitourinary:  Negative for difficulty urinating and dysuria.  Musculoskeletal:  Positive for arthralgias (right hip) and gait problem. Negative for back pain, joint swelling and myalgias.  Neurological:  Negative for dizziness, tremors, seizures, syncope, facial asymmetry, speech difficulty, weakness, light-headedness, numbness and headaches.  Psychiatric/Behavioral:  Negative for agitation, behavioral problems and confusion.        Memory loss    Health Maintenance  Topic Date Due   COVID-19 Vaccine (4 - Moderna risk series) 11/14/2020   FOOT EXAM  05/14/2022   INFLUENZA VACCINE  06/09/2022   HEMOGLOBIN A1C  06/11/2022   URINE MICROALBUMIN  08/15/2022   Zoster Vaccines- Shingrix (1 of 2) 07/22/2023 (Originally 06/03/1944)   TETANUS/TDAP  09/21/2022   OPHTHALMOLOGY EXAM  10/09/2022   Pneumonia Vaccine 19+ Years old  Completed   HPV VACCINES  Aged Out   DEXA SCAN  Discontinued    Physical Exam: Vitals:   07/06/22 1401  BP: 134/76  Pulse: 93  Temp: 97.7 F (36.5 C)  SpO2: 97%  Weight: 122 lb (55.3 kg)  Height: 4\' 11"  (1.499 m)   Body mass index is 24.64 kg/m. Physical Exam Vitals and nursing note reviewed.  Constitutional:      General: She is not in acute distress.    Appearance: She is not diaphoretic.  HENT:     Head: Normocephalic and atraumatic.     Right Ear: There is impacted cerumen.     Left Ear: There is impacted cerumen.     Nose: Nose normal.     Mouth/Throat:     Mouth: Mucous membranes are moist.     Pharynx: Oropharynx is clear.  Eyes:     Conjunctiva/sclera: Conjunctivae normal.     Pupils: Pupils are equal, round, and reactive to light.  Neck:     Vascular: No JVD.   Cardiovascular:     Rate and Rhythm: Normal rate and regular rhythm.     Heart sounds: No murmur heard. Pulmonary:     Effort: Pulmonary effort is normal. No respiratory distress.     Breath sounds: Normal breath sounds. No wheezing.  Abdominal:     General: Bowel sounds are normal. There is no distension.     Palpations: Abdomen is soft.     Tenderness: There is no abdominal tenderness.  Musculoskeletal:        General: No swelling, tenderness, deformity or signs of injury.     Cervical back: No rigidity or tenderness.  Right lower leg: No edema.     Left lower leg: No edema.  Lymphadenopathy:     Cervical: No cervical adenopathy.  Skin:    General: Skin is warm and dry.  Neurological:     Mental Status: She is alert and oriented to person, place, and time.  Psychiatric:        Mood and Affect: Mood normal.     Labs reviewed: Basic Metabolic Panel: Recent Labs    12/12/21 1128 01/07/22 1626 04/02/22 1151 04/29/22 0000 06/08/22 0000  NA 138 135 141  --  138  K 5.3 5.0 5.0 3.9 4.5  CL 101 101 106 94* 102  CO2 28 21 23 16 21   GLUCOSE 142* 156* 142*  --   --   BUN 34* 39* 38* 30* 29*  CREATININE 2.31* 2.01* 1.88* 1.9* 2.0*  CALCIUM 9.9 9.7 10.0 8.5* 9.3  MG 3.1*  --   --   --   --   TSH 2.22  --   --   --   --    Liver Function Tests: Recent Labs    12/12/21 1128 01/07/22 1626 04/02/22 1151 04/29/22 0000  AST 14 12 13   --   ALT 9 11 10   --   BILITOT 0.4 0.3 0.3  --   PROT 7.5 7.5 7.9  --   ALBUMIN  --   --   --  3.7   No results for input(s): "LIPASE", "AMYLASE" in the last 8760 hours. No results for input(s): "AMMONIA" in the last 8760 hours. CBC: Recent Labs    12/12/21 1128 01/07/22 1626 04/02/22 1151 04/27/22 0000 05/18/22 0000 05/26/22 0000 06/08/22 0000  WBC 9.0 13.1* 9.5   < > 6.8 6.8 8.7  NEUTROABS 4,995 8,764* 5,900  --   --   --   --   HGB 11.5* 10.6* 11.5*   < > 9.0* 9.9* 10.9*  HCT 34.7* 31.6* 35.1   < > 27* 30* 32*  MCV 93.8  92.9 92.6  --   --   --   --   PLT 198 358 245   < > 272 168 192   < > = values in this interval not displayed.   Lipid Panel: Recent Labs    08/15/21 1053  CHOL 307*  HDL 49*  LDLCALC 198*  TRIG 356*  CHOLHDL 6.3*   Lab Results  Component Value Date   HGBA1C 6.5 (H) 12/12/2021    Procedures since last visit: No results found.  Assessment/Plan  1. Bilateral impacted cerumen Not able to visualize TM due to wax Recommend debrox 4 gtts to both ears each night x 4 but would AVOID irrigation   2. Essential hypertension Controlled Continue Norvasc   3. Type 2 diabetes mellitus with stage 4 chronic kidney disease, without long-term current use of insulin (HCC) Lab Results  Component Value Date   HGBA1C 6.5 (H) 12/12/2021   Diet controlled.   4. Hyperlipidemia associated with type 2 diabetes mellitus ([place]) Not on a statin 88 years old with goals of care comfort based.   5. CKD stage 4 due to type 2 diabetes mellitus ([place]) Followed by nephrology On sodium bicarb  Check labs Avoid nsaids  6. Sensorineural hearing loss (SNHL) of both ears Noted.   7. Primary osteoarthritis of both hips Controlled pain with tylenol   8. Chronic osteomyelitis of temporal bone (HCC) No current symptoms Focusing on quality of life and not further treatment due to goals  of care.   9. Heme positive stool Hx of this now on protonix No aggressive workup Check CBC   Need update vaccine records   Labs/tests ordered:  * No order type specified *CBC BMP A1C in am  Next appt: 3 months with Dr Lyndel Safe.    Total time 44min:  time greater than 50% of total time spent doing pt counseling and coordination of care

## 2022-07-07 LAB — CBC: RBC: 3.15 — AB (ref 3.87–5.11)

## 2022-07-07 LAB — HEMOGLOBIN A1C: Hemoglobin A1C: 6.2

## 2022-07-07 LAB — CBC AND DIFFERENTIAL
HCT: 29 — AB (ref 36–46)
Hemoglobin: 10 — AB (ref 12.0–16.0)
Platelets: 207 10*3/uL (ref 150–400)
WBC: 7.8

## 2022-07-21 ENCOUNTER — Encounter: Payer: Medicare Other | Admitting: Internal Medicine

## 2022-08-27 IMAGING — CT CT TEMPORAL BONES W/O CM
1 series · 11 of 30 positions shown, 14 images · non-contrast
Comparison: No pertinent prior exam.
COMPARISON: No pertinent prior exam.

Addendum:
CLINICAL DATA: [AGE] female new onset hearing loss.

EXAM:
CT TEMPORAL BONES WITHOUT CONTRAST
TECHNIQUE: Axial and coronal plane CT imaging of the petrous temporal bones was
performed with thin-collimation image reconstruction. No intravenous
contrast was administered. Multiplanar CT image reconstructions were
also generated.
RADIATION DOSE REDUCTION: This exam was performed according to the
departmental dose-optimization program which includes automated
exposure control, adjustment of the mA and/or kV according to
patient size and/or use of iterative reconstruction technique.

[Series 5: soft tissue · axial · 0.39mm/px · z∈[-158,-100]mm · 11 of 35 slices shown, 14 images]
[im 3/35  brain]
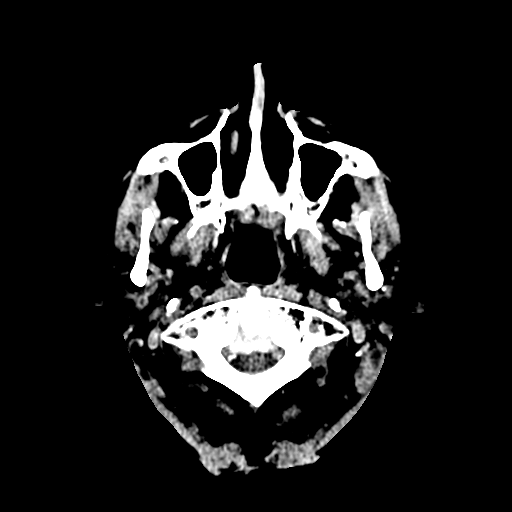
[im 3/35  bone]
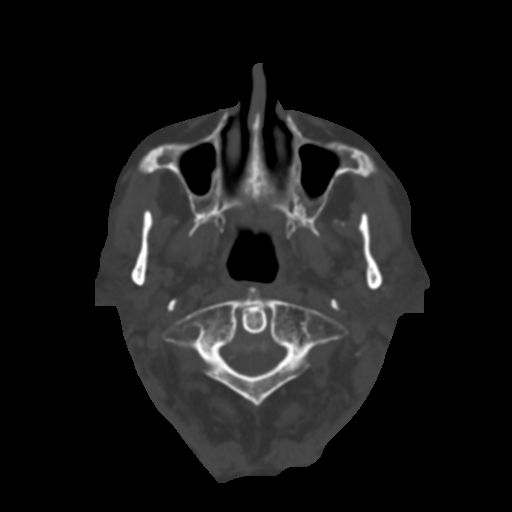
[im 5/35  bone]
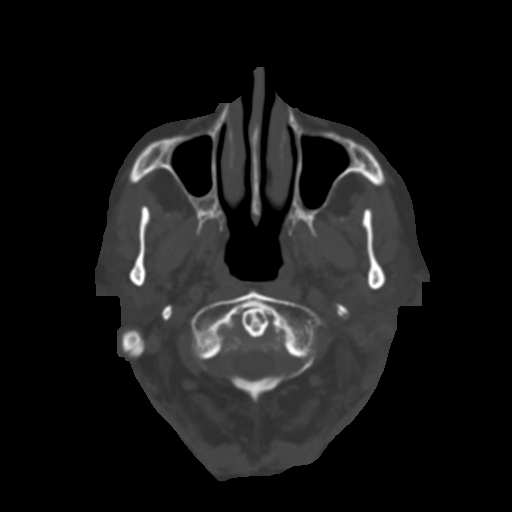
[im 9/35  bone]
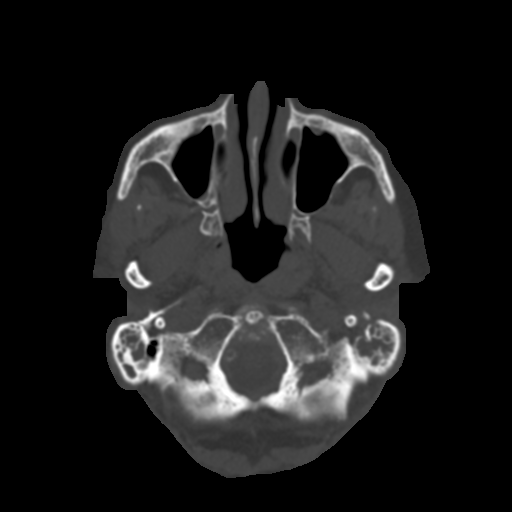
[im 11/35  bone]
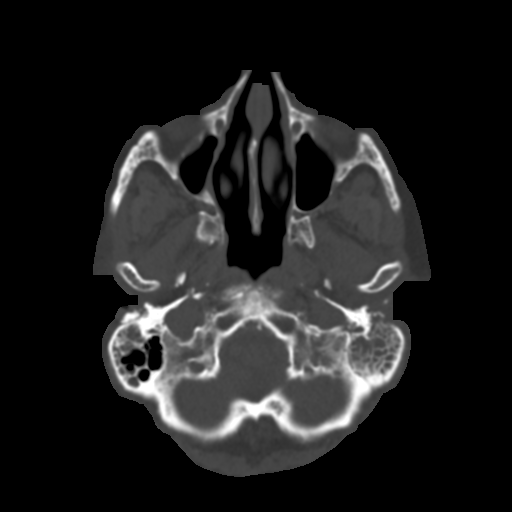
[im 15/35  brain]
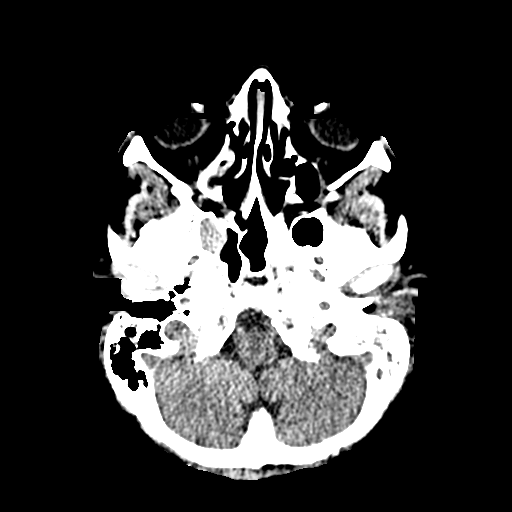
[im 15/35  bone]
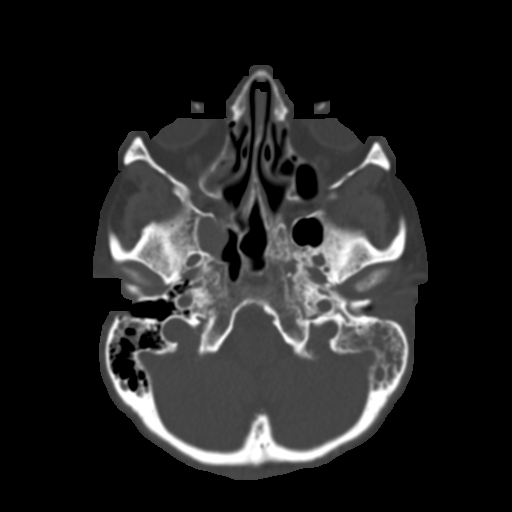
[im 18/35  bone]
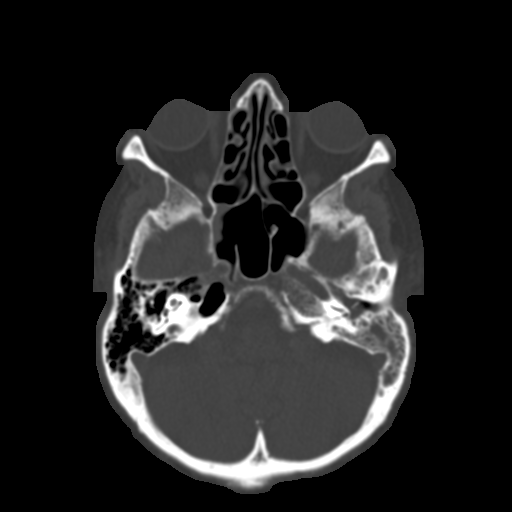
[im 20/35  bone]
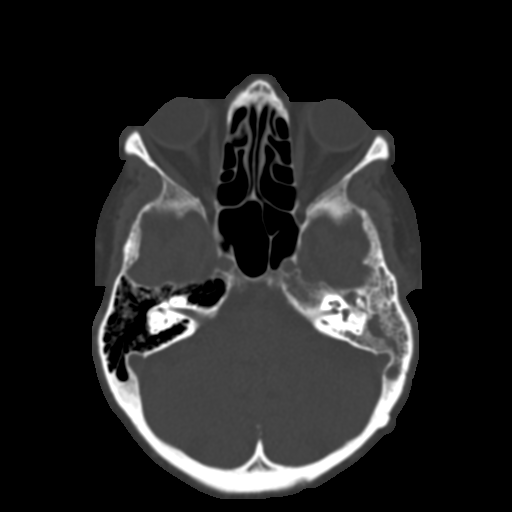
[im 24/35  bone]
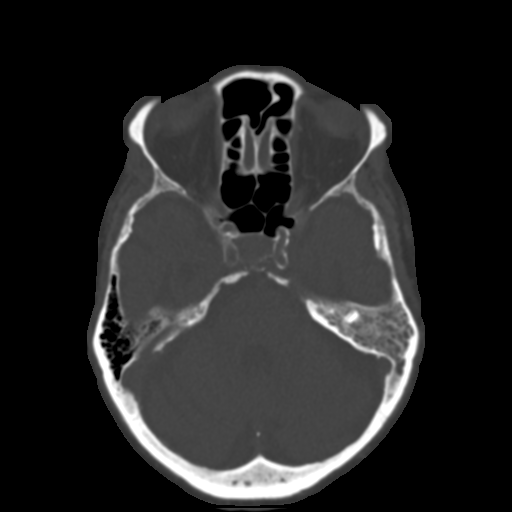
[im 26/35  brain]
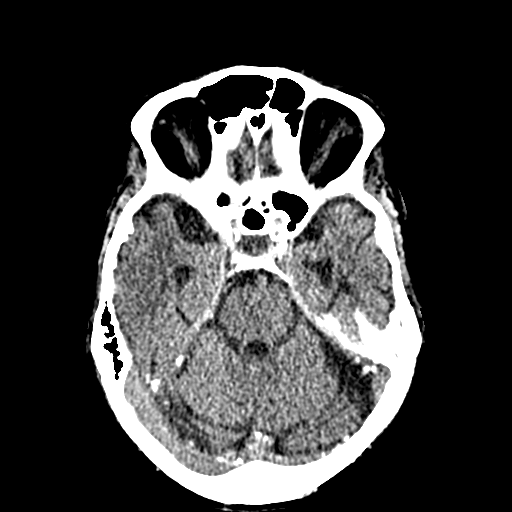
[im 26/35  bone]
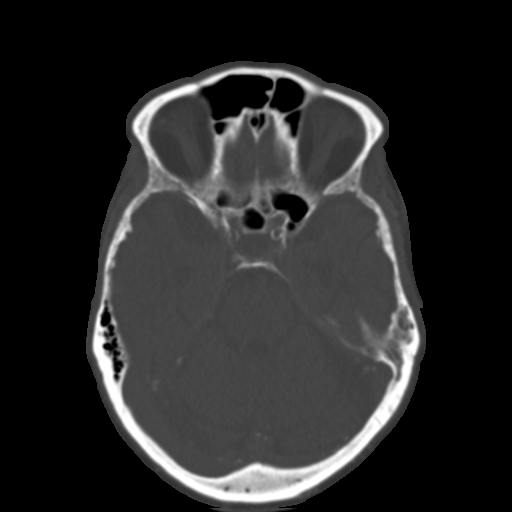
[im 30/35  bone]
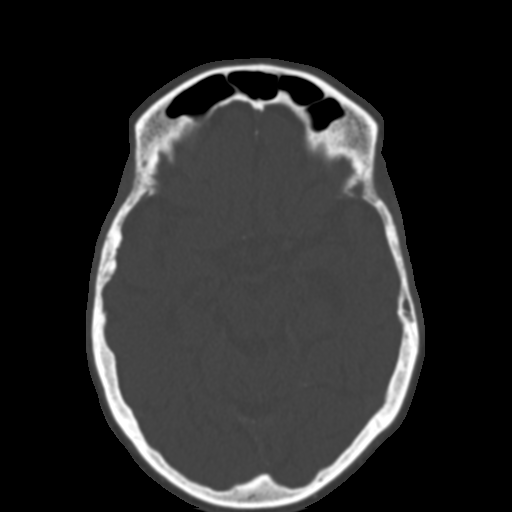
[im 32/35  bone]
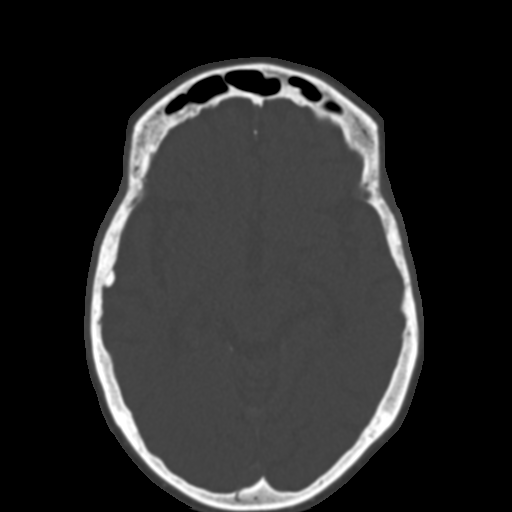

[11 of 30 positions shown; findings below may reference images not displayed]

FINDINGS: RIGHT TEMPORAL BONE

Mild debris in the right external auditory canal (series 9, image
83) which otherwise appears normal. Right tympanic membrane remains
within normal limits. Right tympanic cavity is clear. Scutum and
ossicles are intact and aligned. Right mastoid antrum is clear.

Majority of the right mastoid air cells are clear. And right petrous
apex air cells are clear. But posteroinferior right mastoid air
cells are opacified (series 7, image 42). No associated mastoid
coalescence or bone erosion identified.

Mildly high riding right IJ bulb with intact overlying bony covering
(series 7, image 49 and series 9, image 92. Right IAC, cochlea,
vestibule and vestibular aqueduct are within normal limits. Course
of the right 7th nerve remains normal. Right semicircular canals
remain within normal limits.

LEFT TEMPORAL BONE

Subtotal opacification of the right external auditory canal (series
10, image 71). A portion of the EAC near the tympanic membrane
remains pneumatized. But tympanic membrane detail is obscured. No
definite EAC bony erosion.

Completely opacified left tympanic cavity.

But the left scutum appears partially eroded (series 10, image 71).

However, the left ossicles appear to remain intact and aligned.

Left mastoid antrum and air cells are completely opacified. However,
no mastoid coalescence is evident.

Still, there is some dehiscence of the left sigmoid plate (series 8,
image 43 and series 10, image 103). Left IJ bulb appears to remain
intact and is not high riding.

Left internal auditory canal, cochlea, vestibule and vestibular
aqueduct are within normal limits. Left semicircular canals remain
within normal limits. Much of the course of the left facial nerve is
opacified. Left stylomastoid foramen appears to remain normal.

Vascular: Calcified atherosclerosis at the skull base.

Limited intracranial: Negative for age visible noncontrast brain
parenchyma.

Visible orbits/paranasal sinuses: Chronic postoperative changes to
both globes.

Generally well aerated and somewhat hyperplastic paranasal sinuses.
There is mild to moderate mucosal thickening in the inferolateral
component of hyperplastic right sphenoid sinus. No sinus fluid
levels.

In general there is osteopenia throughout the skull base.

Soft tissues: Negative visible noncontrast deep soft tissue spaces
of the face.
IMPRESSION: 1. Subtotally opacified left external auditory canal with completely
opacified left tympanic cavity and mastoids. No obvious ossicle
erosion or mastoid coalescence, but both a portion of the left
scutum and the left sigmoid venous canal appear partially eroded:
The latter places the patient at risk for complication such as
sigmoid venous sinus thrombosis.

2. Mild debris in the right external auditory canal and mild right
mastoid effusion. But otherwise negative right temporal bone.

3. Hyperplastic paranasal sinuses with mild to moderate mucosal
thickening in the right sphenoid.

ADDENDUM:
Study discussed by telephone with Dr. DINESAN TAMBI on 02/27/2022 at [DATE]
.

I discussed the left temporal bone findings with Dr. Arnaud Joel, including
the left mastoid erosion at the sigmoid plate.

He advises that the patient has had a chronic left ear infection,
not responding to antibiotics.

And furthermore, he advised on the phone that the opacification of
the left EAC clinically is due to an obstructing "polyp", which was
indeterminate on physical inspection although he took some fragments
of the lesion in his office and might be able to sent for histology.

Although the majority of the left EAC is intact, I do now identify
one suspicious area of erosion along the anterior inferior mastoid
(series 3, image 42) opposite the sigmoid plate erosion, which could
be in continuity with the posteroinferior margin of the "polyp"
(opacified EAC).

*** End of Addendum ***
FINDINGS: RIGHT TEMPORAL BONE

Mild debris in the right external auditory canal (series 9, image
83) which otherwise appears normal. Right tympanic membrane remains
within normal limits. Right tympanic cavity is clear. Scutum and
ossicles are intact and aligned. Right mastoid antrum is clear.

Majority of the right mastoid air cells are clear. And right petrous
apex air cells are clear. But posteroinferior right mastoid air
cells are opacified (series 7, image 42). No associated mastoid
coalescence or bone erosion identified.

Mildly high riding right IJ bulb with intact overlying bony covering
(series 7, image 49 and series 9, image 92. Right IAC, cochlea,
vestibule and vestibular aqueduct are within normal limits. Course
of the right 7th nerve remains normal. Right semicircular canals
remain within normal limits.

LEFT TEMPORAL BONE

Subtotal opacification of the right external auditory canal (series
10, image 71). A portion of the EAC near the tympanic membrane
remains pneumatized. But tympanic membrane detail is obscured. No
definite EAC bony erosion.

Completely opacified left tympanic cavity.

But the left scutum appears partially eroded (series 10, image 71).

However, the left ossicles appear to remain intact and aligned.

Left mastoid antrum and air cells are completely opacified. However,
no mastoid coalescence is evident.

Still, there is some dehiscence of the left sigmoid plate (series 8,
image 43 and series 10, image 103). Left IJ bulb appears to remain
intact and is not high riding.

Left internal auditory canal, cochlea, vestibule and vestibular
aqueduct are within normal limits. Left semicircular canals remain
within normal limits. Much of the course of the left facial nerve is
opacified. Left stylomastoid foramen appears to remain normal.

Vascular: Calcified atherosclerosis at the skull base.

Limited intracranial: Negative for age visible noncontrast brain
parenchyma.

Visible orbits/paranasal sinuses: Chronic postoperative changes to
both globes.

Generally well aerated and somewhat hyperplastic paranasal sinuses.
There is mild to moderate mucosal thickening in the inferolateral
component of hyperplastic right sphenoid sinus. No sinus fluid
levels.

In general there is osteopenia throughout the skull base.

Soft tissues: Negative visible noncontrast deep soft tissue spaces
of the face.
IMPRESSION: 1. Subtotally opacified left external auditory canal with completely
opacified left tympanic cavity and mastoids. No obvious ossicle
erosion or mastoid coalescence, but both a portion of the left
scutum and the left sigmoid venous canal appear partially eroded:
The latter places the patient at risk for complication such as
sigmoid venous sinus thrombosis.

2. Mild debris in the right external auditory canal and mild right
mastoid effusion. But otherwise negative right temporal bone.

3. Hyperplastic paranasal sinuses with mild to moderate mucosal
thickening in the right sphenoid.

## 2022-10-06 ENCOUNTER — Non-Acute Institutional Stay: Payer: Medicare Other | Admitting: Internal Medicine

## 2022-10-06 ENCOUNTER — Encounter: Payer: Self-pay | Admitting: Internal Medicine

## 2022-10-06 VITALS — BP 148/68 | HR 81 | Temp 97.7°F | Resp 17 | Ht 59.0 in | Wt 126.0 lb

## 2022-10-06 DIAGNOSIS — E1169 Type 2 diabetes mellitus with other specified complication: Secondary | ICD-10-CM

## 2022-10-06 DIAGNOSIS — E1122 Type 2 diabetes mellitus with diabetic chronic kidney disease: Secondary | ICD-10-CM

## 2022-10-06 DIAGNOSIS — M25551 Pain in right hip: Secondary | ICD-10-CM

## 2022-10-06 DIAGNOSIS — M8668 Other chronic osteomyelitis, other site: Secondary | ICD-10-CM

## 2022-10-06 DIAGNOSIS — D631 Anemia in chronic kidney disease: Secondary | ICD-10-CM

## 2022-10-06 DIAGNOSIS — N184 Chronic kidney disease, stage 4 (severe): Secondary | ICD-10-CM

## 2022-10-06 DIAGNOSIS — I10 Essential (primary) hypertension: Secondary | ICD-10-CM

## 2022-10-06 DIAGNOSIS — E785 Hyperlipidemia, unspecified: Secondary | ICD-10-CM

## 2022-10-06 DIAGNOSIS — R195 Other fecal abnormalities: Secondary | ICD-10-CM

## 2022-10-06 NOTE — Progress Notes (Signed)
Location:  [place] of Service:  Clinic ([id number])  Provider:   Code Status:  Goals of Care:     10/06/2022   10:06 AM  Advanced Directives  Does Patient Have a Medical Advance Directive? Yes  Type of Paramedic of [place];Living will  Does patient want to make changes to medical advance directive? No - Patient declined  Copy of [place] in Chart? Yes - validated most recent copy scanned in chart (See row information)     Chief Complaint  Patient presents with   Medical Management of Chronic Issues    3 month follow up.   Quality Metric Gaps    Discussed the need for AWV and foot exam    HPI: Patient is a 88 y.o. female seen today for medical management of chronic diseases.    Lives in [place] in [place]  Doing well Walker for walking and Wheelchair for long distance  Has h/o  Patient has h/o Hypertension, HLD, Vit D def, Type 2 diabetes, CKD stage 4   anemia  Had Chronic Osteomyelitis of Left Temporal bone treated with Oral Cipro and Linezolid Could not finish the total duration due to Side effects She has been in AL and has been doing well No Pain . Lost Hearing in Left ear Very little in Right ear. Mostly reads Lips  No Dizziness  No Other issues No Nursing issues  Has gained weight now   Wt Readings from Last 3 Encounters:  10/06/22 126 lb (57.2 kg)  07/06/22 122 lb (55.3 kg)  06/01/22 112 lb 9.6 oz (51.1 kg)  Tylenol helps with her pain    Past Medical History:  Diagnosis Date   Arthritis    HLD (hyperlipidemia)    HTN (hypertension)    echo (11/11) with EF 55-60%, mild LV hypertrophy, PA systolic pressure 34 mmHg   Low back pain    Pre-diabetes    diet controlled   Unspecified vitamin D deficiency     Past Surgical History:  Procedure Laterality Date   ABDOMINAL HYSTERECTOMY     partial   abnormal EKG     BACK SURGERY     cataract surgery     colonoscopsy  2008   Neg  Dr  Amedeo Plenty   EYE SURGERY Bilateral    cataract   JOINT REPLACEMENT     left hip replacement , right knee replacement    lexiscan myoview  11/11   EF 81%, normal perfusion images suggesting no ischemia or infarction   SPINE SURGERY     lumbar   surgery for DDD     TOTAL HIP ARTHROPLASTY  12/02/2012   Procedure: TOTAL HIP ARTHROPLASTY;  Surgeon: Gearlean Alf, MD;  Location: [facility];  Service: Orthopedics;  Laterality: Right;    No Known Allergies  Outpatient Encounter Medications as of 10/06/2022  Medication Sig   acetaminophen (TYLENOL) 500 MG tablet Take 500 mg by mouth 2 (two) times daily. Between 8-11 am and 4-7 pm   acetaminophen (TYLENOL) 500 MG tablet Take 500 mg by mouth 2 (two) times daily as needed. See other listing for scheduled dosing   amLODipine (NORVASC) 5 MG tablet Take 5 mg by mouth daily. At 12 pm   pantoprazole (PROTONIX) 20 MG tablet Take 20 mg by mouth daily. Between 8-11 am   sodium bicarbonate 650 MG tablet Take 650 mg by mouth daily.   No facility-administered encounter medications on file as  of 10/06/2022.    Review of Systems:  Review of Systems  Constitutional:  Negative for activity change and appetite change.  HENT:  Positive for hearing loss.   Respiratory:  Negative for cough and shortness of breath.   Cardiovascular:  Negative for leg swelling.  Gastrointestinal:  Negative for constipation.  Genitourinary: Negative.   Musculoskeletal:  Positive for gait problem. Negative for arthralgias and myalgias.  Skin: Negative.   Neurological:  Negative for dizziness and weakness.  Psychiatric/Behavioral:  Negative for confusion, dysphoric mood and sleep disturbance.     Health Maintenance  Topic Date Due   Medicare Annual Wellness (AWV)  Never done   COVID-19 Vaccine (4 - 2023-24 season) 07/10/2022   Zoster Vaccines- Shingrix (1 of 2) 07/22/2023 (Originally 06/03/1944)   OPHTHALMOLOGY EXAM  10/09/2022   HEMOGLOBIN A1C  01/07/2023   FOOT EXAM  10/07/2023    Pneumonia Vaccine 64+ Years old  Completed   INFLUENZA VACCINE  Completed   HPV VACCINES  Aged Out   DEXA SCAN  Discontinued    Physical Exam: Vitals:   10/06/22 1008  BP: (!) 148/68  Pulse: 81  Resp: 17  Temp: 97.7 F (36.5 C)  TempSrc: Temporal  SpO2: 99%  Weight: 126 lb (57.2 kg)  Height: 4\' 11"  (1.499 m)   Body mass index is 25.45 kg/m. Physical Exam Vitals reviewed.  Constitutional:      Appearance: Normal appearance.  HENT:     Head: Normocephalic.     Ears:     Comments: Wax both ears    Nose: Nose normal.     Mouth/Throat:     Mouth: Mucous membranes are moist.     Pharynx: Oropharynx is clear.  Eyes:     Pupils: Pupils are equal, round, and reactive to light.  Cardiovascular:     Rate and Rhythm: Normal rate and regular rhythm.     Pulses: Normal pulses.     Heart sounds: Normal heart sounds. No murmur heard. Pulmonary:     Effort: Pulmonary effort is normal.     Breath sounds: Normal breath sounds.  Abdominal:     General: Abdomen is flat. Bowel sounds are normal.     Palpations: Abdomen is soft.  Musculoskeletal:        General: No swelling.     Cervical back: Neck supple.     Comments: Foot exam Has thick Toe nails Callus in Right Heel DP Palpable both Feet Good sensation  Skin:    General: Skin is warm.  Neurological:     General: No focal deficit present.     Mental Status: She is alert and oriented to person, place, and time.  Psychiatric:        Mood and Affect: Mood normal.        Thought Content: Thought content normal.     Labs reviewed: Basic Metabolic Panel: Recent Labs    12/12/21 1128 01/07/22 1626 04/02/22 1151 04/29/22 0000 06/08/22 0000  NA 138 135 141  --  138  K 5.3 5.0 5.0 3.9 4.5  CL 101 101 106 94* 102  CO2 28 21 23 16 21   GLUCOSE 142* 156* 142*  --   --   BUN 34* 39* 38* 30* 29*  CREATININE 2.31* 2.01* 1.88* 1.9* 2.0*  CALCIUM 9.9 9.7 10.0 8.5* 9.3  MG 3.1*  --   --   --   --   TSH 2.22  --   --   --    --  Liver Function Tests: Recent Labs    12/12/21 1128 01/07/22 1626 04/02/22 1151 04/29/22 0000  AST 14 12 13   --   ALT 9 11 10   --   BILITOT 0.4 0.3 0.3  --   PROT 7.5 7.5 7.9  --   ALBUMIN  --   --   --  3.7   No results for input(s): "LIPASE", "AMYLASE" in the last 8760 hours. No results for input(s): "AMMONIA" in the last 8760 hours. CBC: Recent Labs    12/12/21 1128 01/07/22 1626 04/02/22 1151 04/27/22 0000 05/26/22 0000 06/08/22 0000 07/07/22 0000  WBC 9.0 13.1* 9.5   < > 6.8 8.7 7.8  NEUTROABS 4,995 8,764* 5,900  --   --   --   --   HGB 11.5* 10.6* 11.5*   < > 9.9* 10.9* 10.0*  HCT 34.7* 31.6* 35.1   < > 30* 32* 29*  MCV 93.8 92.9 92.6  --   --   --   --   PLT 198 358 245   < > 168 192 207   < > = values in this interval not displayed.   Lipid Panel: No results for input(s): "CHOL", "HDL", "LDLCALC", "TRIG", "CHOLHDL", "LDLDIRECT" in the last 8760 hours. Lab Results  Component Value Date   HGBA1C 6.2 07/07/2022    Procedures since last visit: No results found.  Assessment/Plan 1. Type 2 diabetes mellitus with stage 4 chronic kidney disease, without long-term current use of insulin (HCC) A1C good No Meds Repeat before next visit Foot exam done Wrote for Nurses to cut toe nails 2. Hyperlipidemia associated with type 2 diabetes mellitus ([place]) No statin due to goals of care  3. CKD stage 4 due to type 2 diabetes mellitus (HCC) On Bicarb Repeat BMP ? Follows with Renal Dont see notes   4. Chronic osteomyelitis of temporal bone (HCC) Never finished the course but now no symptoms except hearing loss  5. Anemia due to stage 4 chronic kidney disease (HCC) Repeat CBC  6. Right hip pain Tylenol  7. Essential hypertension On Amlodipine  8. H/O Heme positive stool On Protonix Family decided not to pursue due to her age    Labs/tests ordered:  CBC,BMP,A1C in 4 weeks Next appt:  02/08/2023

## 2022-10-07 ENCOUNTER — Encounter: Payer: Medicare Other | Admitting: Internal Medicine

## 2022-10-27 ENCOUNTER — Telehealth: Payer: Self-pay | Admitting: Internal Medicine

## 2022-10-27 NOTE — Telephone Encounter (Signed)
Patient was checked for Covid due to cough and congestion She is covid Positive Will start her on Paxlovid Renal Dosing

## 2022-11-03 LAB — COMPREHENSIVE METABOLIC PANEL
Albumin: 4.1 (ref 3.5–5.0)
Calcium: 9.4 (ref 8.7–10.7)
Globulin: 2.8
eGFR: 25

## 2022-11-03 LAB — CBC AND DIFFERENTIAL
HCT: 33 — AB (ref 36–46)
Hemoglobin: 10.8 — AB (ref 12.0–16.0)
Platelets: 224 10*3/uL (ref 150–400)
WBC: 8.1

## 2022-11-03 LAB — BASIC METABOLIC PANEL
BUN: 35 — AB (ref 4–21)
CO2: 21 (ref 13–22)
Chloride: 102 (ref 99–108)
Creatinine: 1.8 — AB (ref 0.5–1.1)
Potassium: 4.4 mEq/L (ref 3.5–5.1)
Sodium: 137 (ref 137–147)

## 2022-11-03 LAB — HEPATIC FUNCTION PANEL
ALT: 7 U/L (ref 7–35)
AST: 17 (ref 13–35)
Alkaline Phosphatase: 84 (ref 25–125)
Bilirubin, Total: 0.2

## 2022-11-03 LAB — CBC: RBC: 3.7 — AB (ref 3.87–5.11)

## 2022-11-03 LAB — HEMOGLOBIN A1C: Hemoglobin A1C: 6.5

## 2022-11-15 ENCOUNTER — Encounter: Payer: Self-pay | Admitting: Internal Medicine

## 2022-11-17 ENCOUNTER — Encounter: Payer: Self-pay | Admitting: Orthopedic Surgery

## 2022-11-17 ENCOUNTER — Non-Acute Institutional Stay: Payer: Medicare Other | Admitting: Orthopedic Surgery

## 2022-11-17 DIAGNOSIS — H6123 Impacted cerumen, bilateral: Secondary | ICD-10-CM

## 2022-11-17 DIAGNOSIS — M8668 Other chronic osteomyelitis, other site: Secondary | ICD-10-CM

## 2022-11-17 DIAGNOSIS — H903 Sensorineural hearing loss, bilateral: Secondary | ICD-10-CM

## 2022-11-17 NOTE — Progress Notes (Signed)
Location:  [place] Room Number: [place] of Service:  ALF [phone]) Provider:  Yvonna Alanis, NP   Virgie Dad, MD  Patient Care Team: Virgie Dad, MD as PCP - General (Internal Medicine) Gaynelle Arabian, MD as Consulting Physician (Orthopedic Surgery) Harrie Foreman, [place] as Referring Physician (Optometry) Teena Irani, MD (Inactive) as Consulting Physician (Gastroenterology) Suella Broad, MD as Consulting Physician (Physical Medicine and Rehabilitation) Almyra Deforest, [place] as Consulting Physician (Cardiology) Leta Baptist, MD as Consulting Physician (Otolaryngology) Elisha Ponder, MD as Resident (Otolaryngology)  Extended Emergency Contact Information Primary Emergency Contact: Neva Seat [place] Phone: [phone] Mobile Phone: [phone] Relation: Daughter Secondary Emergency Contact: Cristela Blue [place] Mobile Phone: [phone] Relation: Son  Code Status:  DNR Goals of care: Advanced Directive information    10/06/2022   10:06 AM  Advanced Directives  Does Patient Have a Medical Advance Directive? Yes  Type of Paramedic of [place];Living will  Does patient want to make changes to medical advance directive? No - Patient declined  Copy of [place] in Chart? Yes - validated most recent copy scanned in chart (See row information)     Chief Complaint  Patient presents with   Acute Visit    Increased hearing loss    HPI:  Pt is a 89 y.o. female seen today for increased hearing loss.   Followed by I/D for osteomyelitis left temporal bone. Also history of large left canal polyp, chronic otitis media and mastoiditis. She continues to have progressive hearing loss to left ear. Today, she reports right ear feel plugged. Prior to entering her room, I noticed her television was at a very high volume. She has had wax buildup in the past.  Requesting ear examination today. Denies ear pain or drainage. Afebrile. Vitals stable.    Past Medical History:  Diagnosis Date   Arthritis    HLD (hyperlipidemia)    HTN (hypertension)    echo (11/11) with EF 55-60%, mild LV hypertrophy, PA systolic pressure 34 mmHg   Low back pain    Pre-diabetes    diet controlled   Unspecified vitamin D deficiency    Past Surgical History:  Procedure Laterality Date   ABDOMINAL HYSTERECTOMY     partial   abnormal EKG     BACK SURGERY     cataract surgery     colonoscopsy  2008   Neg  Dr Amedeo Plenty   EYE SURGERY Bilateral    cataract   JOINT REPLACEMENT     left hip replacement , right knee replacement    lexiscan myoview  11/11   EF 81%, normal perfusion images suggesting no ischemia or infarction   SPINE SURGERY     lumbar   surgery for DDD     TOTAL HIP ARTHROPLASTY  12/02/2012   Procedure: TOTAL HIP ARTHROPLASTY;  Surgeon: Gearlean Alf, MD;  Location: [facility];  Service: Orthopedics;  Laterality: Right;    No Known Allergies  Outpatient Encounter Medications as of 11/17/2022  Medication Sig   acetaminophen (TYLENOL) 500 MG tablet Take 500 mg by mouth 2 (two) times daily. Between 8-11 am and 4-7 pm   acetaminophen (TYLENOL) 500 MG tablet Take 500 mg by mouth 2 (two) times daily as needed. See other listing for scheduled dosing   amLODipine (NORVASC) 5 MG tablet Take 5 mg by mouth daily. At 12 pm   pantoprazole (PROTONIX) 20 MG  tablet Take 20 mg by mouth daily. Between 8-11 am   sodium bicarbonate 650 MG tablet Take 650 mg by mouth daily.   No facility-administered encounter medications on file as of 11/17/2022.    Review of Systems  Constitutional:  Negative for activity change, appetite change and fever.  HENT:  Positive for hearing loss. Negative for ear discharge and ear pain.   Respiratory:  Negative for cough, shortness of breath and wheezing.   Cardiovascular:  Negative for chest pain and leg swelling.   Psychiatric/Behavioral:  Negative for confusion and dysphoric mood. The patient is not nervous/anxious.     Immunization History  Administered Date(s) Administered   Fluad Quad(high Dose 65+) 09/14/2022   Influenza Split 10/11/2013   Influenza, High Dose Seasonal PF 09/18/2014, 09/20/2015, 08/04/2017, 09/16/2018, 10/16/2019, 08/15/2021   Influenza, Seasonal, Injecte, Preservative Fre 10/15/2016   Influenza-Unspecified 10/10/2012, 08/09/2020   Moderna Sars-Covid-2 Vaccination 11/27/2019, 12/28/2019, 09/19/2020   Pneumococcal Conjugate-13 12/19/2014   Pneumococcal Polysaccharide-23 10/11/2013   Tdap 09/21/2012   Pertinent  Health Maintenance Due  Topic Date Due   OPHTHALMOLOGY EXAM  10/09/2022   HEMOGLOBIN A1C  01/07/2023   FOOT EXAM  10/07/2023   INFLUENZA VACCINE  Completed   DEXA SCAN  Discontinued      05/14/2021   10:07 PM 12/12/2021   11:01 AM 04/08/2022   10:07 AM 04/15/2022    8:33 AM 10/06/2022   10:06 AM  Fall Risk  Falls in the past year? 0 0 1 1 1   Was there an injury with Fall?  0 1 1 1   Fall Risk Category Calculator  0 2 2 2   Fall Risk Category  Low Moderate Moderate Moderate  Patient Fall Risk Level  Moderate fall risk  High fall risk High fall risk  Patient at Risk for Falls Due to No Fall Risks History of fall(s);Impaired mobility;Impaired balance/gait History of fall(s) History of fall(s);Impaired balance/gait;Impaired mobility History of fall(s)  Fall risk Follow up Falls evaluation completed;Education provided;Falls prevention discussed Falls prevention discussed;Falls evaluation completed;Education provided Falls evaluation completed Falls evaluation completed Falls evaluation completed  Fall risk Follow up - Comments  Declines PT this visit      Functional Status Survey:    Vitals:   11/17/22 1510  BP: (!) 148/71  Pulse: 87  Resp: 18  Temp: (!) 96.8 F (36 C)  SpO2: 99%  Weight: 130 lb 12.8 oz (59.3 kg)  Height: 4\' 11"  (1.499 m)   Body mass index  is 26.42 kg/m. Physical Exam Vitals reviewed.  Constitutional:      General: She is not in acute distress. HENT:     Head: Normocephalic.     Right Ear: External ear normal. There is impacted cerumen.     Left Ear: External ear normal. There is impacted cerumen.     Nose: Nose normal.     Mouth/Throat:     Mouth: Mucous membranes are moist.  Eyes:     General: Scleral icterus present.        Right eye: No discharge.        Left eye: Discharge present. Cardiovascular:     Rate and Rhythm: Normal rate and regular rhythm.     Pulses: Normal pulses.     Heart sounds: Normal heart sounds.  Pulmonary:     Effort: Pulmonary effort is normal. No respiratory distress.     Breath sounds: Normal breath sounds. No wheezing.  Musculoskeletal:     Cervical back: Neck supple.  Neurological:  General: No focal deficit present.     Mental Status: She is alert and oriented to person, place, and time.  Psychiatric:        Mood and Affect: Mood normal.        Behavior: Behavior normal.     Labs reviewed: Recent Labs    12/12/21 1128 01/07/22 1626 04/02/22 1151 04/29/22 0000 06/08/22 0000  NA 138 135 141  --  138  K 5.3 5.0 5.0 3.9 4.5  CL 101 101 106 94* 102  CO2 28 21 23 16 21   GLUCOSE 142* 156* 142*  --   --   BUN 34* 39* 38* 30* 29*  CREATININE 2.31* 2.01* 1.88* 1.9* 2.0*  CALCIUM 9.9 9.7 10.0 8.5* 9.3  MG 3.1*  --   --   --   --    Recent Labs    12/12/21 1128 01/07/22 1626 04/02/22 1151 04/29/22 0000  AST 14 12 13   --   ALT 9 11 10   --   BILITOT 0.4 0.3 0.3  --   PROT 7.5 7.5 7.9  --   ALBUMIN  --   --   --  3.7   Recent Labs    12/12/21 1128 01/07/22 1626 04/02/22 1151 04/27/22 0000 05/26/22 0000 06/08/22 0000 07/07/22 0000  WBC 9.0 13.1* 9.5   < > 6.8 8.7 7.8  NEUTROABS 4,995 8,764* 5,900  --   --   --   --   HGB 11.5* 10.6* 11.5*   < > 9.9* 10.9* 10.0*  HCT 34.7* 31.6* 35.1   < > 30* 32* 29*  MCV 93.8 92.9 92.6  --   --   --   --   PLT 198 358 245    < > 168 192 207   < > = values in this interval not displayed.   Lab Results  Component Value Date   TSH 2.22 12/12/2021   Lab Results  Component Value Date   HGBA1C 6.2 07/07/2022   Lab Results  Component Value Date   CHOL 307 (H) 08/15/2021   HDL 49 (L) 08/15/2021   LDLCALC 198 (H) 08/15/2021   TRIG 356 (H) 08/15/2021   CHOLHDL 6.3 (H) 08/15/2021    Significant Diagnostic Results in last 30 days:  No results found.  Assessment/Plan 1. Bilateral impacted cerumen - unable to visualize TM due to cerumen  - start debrox 5 gtts to both ears BID x 5 days - flush both ears with warm water when Debrox complete  2. Chronic osteomyelitis of temporal bone ([place]) - followed by I/D - involves left ear - past use of levofloxacin, cipro and metronidazole> all antibiotics stopped 04/29/2022 due to N/V/ diarrhea - not on medication at this time  3. Sensorineural hearing loss (SNHL) of both ears - involves both ears, L>R - evaluated by Dr. Benjamine Mola in past - consider audiology testing if hearing to right ear worsens     Family/ staff Communication: plan discussed with patient and nurse  Labs/tests ordered:  none

## 2022-12-16 ENCOUNTER — Ambulatory Visit: Payer: Medicare Other | Admitting: Nurse Practitioner

## 2023-01-18 ENCOUNTER — Non-Acute Institutional Stay: Payer: Medicare Other | Admitting: Adult Health

## 2023-01-18 ENCOUNTER — Encounter: Payer: Self-pay | Admitting: Adult Health

## 2023-01-18 DIAGNOSIS — M5441 Lumbago with sciatica, right side: Secondary | ICD-10-CM

## 2023-01-19 ENCOUNTER — Encounter: Payer: Self-pay | Admitting: Adult Health

## 2023-01-19 MED ORDER — PREDNISONE 20 MG PO TABS: 20.0000 mg | ORAL_TABLET | Freq: Every day | ORAL | 0 refills | Status: DC

## 2023-01-19 NOTE — Progress Notes (Signed)
[facility] clinic  Provider:   Code Status: DNR Goals of Care:     01/18/2023   10:55 AM  Advanced Directives  Does Patient Have a Medical Advance Directive? Yes  Type of Advance Directive Living will;Out of facility DNR (pink MOST or yellow form)  Does patient want to make changes to medical advance directive? No - Patient declined     Chief Complaint  Patient presents with  . Acute Visit    Patient is being seen because she is having some lower back pain  . Quality Metric Gaps    Discussed the need for AWV, Eye Exam    HPI: Patient is a 89 y.o. female seen today for an acute visit for low back pain.  Nurse and resident report low back pain with radiation down into the right hip. This is worsening and causing significant pain. No change in bowel or bladder habits. No recent fall or injury.   She is currently using tylenol which is not helping.  Has CKD can't take NSAIDS.   Remains ambulatory with a walker. Has prior hx of right hip replacement and chronic right hip pain.   Past Medical History:  Diagnosis Date  . Arthritis   . HLD (hyperlipidemia)   . HTN (hypertension)    echo (11/11) with EF 55-60%, mild LV hypertrophy, PA systolic pressure 34 mmHg  . Low back pain   . Pre-diabetes    diet controlled  . Unspecified vitamin D deficiency     Past Surgical History:  Procedure Laterality Date  . ABDOMINAL HYSTERECTOMY     partial  . abnormal EKG    . BACK SURGERY    . cataract surgery    . colonoscopsy  2008   Neg  Dr Amedeo Plenty  . EYE SURGERY Bilateral    cataract  . JOINT REPLACEMENT     left hip replacement , right knee replacement   . lexiscan myoview  11/11   EF 81%, normal perfusion images suggesting no ischemia or infarction  . SPINE SURGERY     lumbar  . surgery for DDD    . TOTAL HIP ARTHROPLASTY  12/02/2012   Procedure: TOTAL HIP ARTHROPLASTY;  Surgeon: Gearlean Alf, MD;  Location: [facility];  Service: Orthopedics;  Laterality: Right;    No Known  Allergies  Outpatient Encounter Medications as of 01/18/2023  Medication Sig  . acetaminophen (TYLENOL) 500 MG tablet Take 500 mg by mouth 2 (two) times daily. Between 8-11 am and 4-7 pm  . acetaminophen (TYLENOL) 500 MG tablet Take 500 mg by mouth 2 (two) times daily as needed. See other listing for scheduled dosing  . amLODipine (NORVASC) 5 MG tablet Take 5 mg by mouth daily. At 12 pm  . pantoprazole (PROTONIX) 20 MG tablet Take 20 mg by mouth daily. Between 8-11 am  . predniSONE (DELTASONE) 20 MG tablet Take 1 tablet (20 mg total) by mouth daily with breakfast.  . sodium bicarbonate 650 MG tablet Take 650 mg by mouth daily.   No facility-administered encounter medications on file as of 01/18/2023.    Review of Systems:  Review of Systems  Constitutional:  Positive for activity change. Negative for appetite change, chills, diaphoresis, fatigue, fever and unexpected weight change.  HENT:  Negative for congestion.   Respiratory:  Negative for cough, shortness of breath and wheezing.   Cardiovascular:  Negative for chest pain, palpitations and leg swelling.  Gastrointestinal:  Negative for abdominal distention, abdominal pain, constipation and diarrhea.  Genitourinary:  Negative for difficulty urinating and dysuria.  Musculoskeletal:  Positive for arthralgias, back pain and gait problem. Negative for joint swelling and myalgias.  Neurological:  Negative for dizziness, tremors, seizures, syncope, facial asymmetry, speech difficulty, weakness, light-headedness, numbness and headaches.  Psychiatric/Behavioral:  Negative for agitation, behavioral problems and confusion.     Health Maintenance  Topic Date Due  . COVID-19 Vaccine (4 - 2023-24 season) 07/10/2022  . DTaP/Tdap/Td (2 - Td or Tdap) 09/21/2022  . OPHTHALMOLOGY EXAM  10/09/2022  . Medicare Annual Wellness (AWV)  12/12/2022  . HEMOGLOBIN A1C  01/07/2023  . Zoster Vaccines- Shingrix (1 of 2) 07/22/2023 (Originally 06/03/1944)  . FOOT  EXAM  10/07/2023  . Pneumonia Vaccine 42+ Years old  Completed  . INFLUENZA VACCINE  Completed  . HPV VACCINES  Aged Out  . DEXA SCAN  Discontinued    Physical Exam: Vitals:   01/18/23 1052  BP: (!) 166/77  Pulse: 88  Resp: 16  Temp: (!) 97.5 F (36.4 C)  TempSrc: Temporal  SpO2: 99%  Weight: 134 lb 9.6 oz (61.1 kg)  Height: '4\' 11"'$  (1.499 m)   Body mass index is 27.19 kg/m. Physical Exam Vitals and nursing note reviewed.  Constitutional:      Appearance: Normal appearance.  Cardiovascular:     Rate and Rhythm: Normal rate and regular rhythm.  Pulmonary:     Effort: Pulmonary effort is normal.     Breath sounds: Normal breath sounds.  Musculoskeletal:        General: Tenderness (low back and right hip) present. No swelling, deformity or signs of injury.     Right lower leg: No edema.     Left lower leg: No edema.     Comments: Strength 4/5 BLE Pain with ROM of the right hip.   Neurological:     Mental Status: She is alert.    Labs reviewed: Basic Metabolic Panel: Recent Labs    04/02/22 1151 04/29/22 0000 06/08/22 0000 11/03/22 0000  NA 141  --  138 137  K 5.0 3.9 4.5 4.4  CL 106 94* 102 102  CO2 '23 16 21 21  '$ GLUCOSE 142*  --   --   --   BUN 38* 30* 29* 35*  CREATININE 1.88* 1.9* 2.0* 1.8*  CALCIUM 10.0 8.5* 9.3 9.4   Liver Function Tests: Recent Labs    04/02/22 1151 04/29/22 0000 11/03/22 0000  AST 13  --  17  ALT 10  --  7  ALKPHOS  --   --  84  BILITOT 0.3  --   --   PROT 7.9  --   --   ALBUMIN  --  3.7 4.1   No results for input(s): "LIPASE", "AMYLASE" in the last 8760 hours. No results for input(s): "AMMONIA" in the last 8760 hours. CBC: Recent Labs    04/02/22 1151 04/27/22 0000 06/08/22 0000 07/07/22 0000 11/03/22 0000  WBC 9.5   < > 8.7 7.8 8.1  NEUTROABS 5,900  --   --   --   --   HGB 11.5*   < > 10.9* 10.0* 10.8*  HCT 35.1   < > 32* 29* 33*  MCV 92.6  --   --   --   --   PLT 245   < > 192 207 224   < > = values in this  interval not displayed.   Lipid Panel: No results for input(s): "CHOL", "HDL", "LDLCALC", "TRIG", "CHOLHDL", "LDLDIRECT" in the  last 8760 hours. Lab Results  Component Value Date   HGBA1C 6.2 07/07/2022    Procedures since last visit: No results found.  Assessment/Plan 1. Acute bilateral low back pain with right-sided sciatica  - predniSONE (DELTASONE) 20 MG tablet; Take 1 tablet (20 mg total) by mouth daily with breakfast.  Dispense: 5 tablet; Refill: 0  Continue tylenol for pain F/U if not improving.   Labs/tests ordered:  * No order type specified *Had labs drawn at nephrology office recently she states, need records.  Next appt:  02/09/23

## 2023-02-08 ENCOUNTER — Encounter: Payer: Medicare Other | Admitting: Adult Health

## 2023-02-09 ENCOUNTER — Encounter: Payer: Self-pay | Admitting: Internal Medicine

## 2023-02-09 ENCOUNTER — Non-Acute Institutional Stay: Payer: Medicare Other | Admitting: Internal Medicine

## 2023-02-09 VITALS — BP 138/72 | HR 95 | Temp 98.1°F | Resp 17 | Ht 59.0 in

## 2023-02-09 DIAGNOSIS — H903 Sensorineural hearing loss, bilateral: Secondary | ICD-10-CM

## 2023-02-09 DIAGNOSIS — E1122 Type 2 diabetes mellitus with diabetic chronic kidney disease: Secondary | ICD-10-CM

## 2023-02-09 DIAGNOSIS — E785 Hyperlipidemia, unspecified: Secondary | ICD-10-CM

## 2023-02-09 DIAGNOSIS — N184 Chronic kidney disease, stage 4 (severe): Secondary | ICD-10-CM | POA: Diagnosis not present

## 2023-02-09 DIAGNOSIS — E1169 Type 2 diabetes mellitus with other specified complication: Secondary | ICD-10-CM | POA: Diagnosis not present

## 2023-02-09 DIAGNOSIS — D631 Anemia in chronic kidney disease: Secondary | ICD-10-CM

## 2023-02-09 NOTE — Progress Notes (Signed)
Location:  [place] of Service:  Clinic (12)  Provider:   Code Status: DNR Goals of Care:     02/09/2023   12:59 PM  Advanced Directives  Does Patient Have a Medical Advance Directive? Yes  Type of Advance Directive Living will;Out of facility DNR (pink MOST or yellow form);Healthcare Power of Attorney  Does patient want to make changes to medical advance directive? No - Patient declined     Chief Complaint  Patient presents with   Medical Management of Chronic Issues    4 month follow upwants to discuss ears   Quality Metric Gaps    Discusses the need for Eye exam     HPI: Patient is a 89 y.o. female seen today for medical management of chronic diseases.    Lives in [place] in [place]  Doing well Walker for walking and Wheelchair for long distance   Has h/o  Patient has h/o Hypertension, HLD, Vit D def, Type 2 diabetes, CKD stage 4   anemia   Had Chronic Osteomyelitis of Left Temporal bone treated with Oral Cipro and Linezolid Could not finish the total duration due to Side effects She has been in AL and has been doing well  Daughter concerned about her hearing Cannot hear her daughter on Phone anymore  No Other issues Walks with  her walker Mild depression but does not think she needs any meds Back pain better  Wt Readings from Last 3 Encounters:  01/18/23 134 lb 9.6 oz (61.1 kg)  11/17/22 130 lb 12.8 oz (59.3 kg)  10/06/22 126 lb (57.2 kg)     Past Medical History:  Diagnosis Date   Arthritis    HLD (hyperlipidemia)    HTN (hypertension)    echo (11/11) with EF 55-60%, mild LV hypertrophy, PA systolic pressure 34 mmHg   Low back pain    Pre-diabetes    diet controlled   Unspecified vitamin D deficiency     Past Surgical History:  Procedure Laterality Date   ABDOMINAL HYSTERECTOMY     partial   abnormal EKG     BACK SURGERY     cataract surgery     colonoscopsy  2008   Neg  Dr Amedeo Plenty   EYE SURGERY Bilateral     cataract   JOINT REPLACEMENT     left hip replacement , right knee replacement    lexiscan myoview  11/11   EF 81%, normal perfusion images suggesting no ischemia or infarction   SPINE SURGERY     lumbar   surgery for DDD     TOTAL HIP ARTHROPLASTY  12/02/2012   Procedure: TOTAL HIP ARTHROPLASTY;  Surgeon: Gearlean Alf, MD;  Location: [facility];  Service: Orthopedics;  Laterality: Right;    No Known Allergies  Outpatient Encounter Medications as of 02/09/2023  Medication Sig   acetaminophen (TYLENOL) 500 MG tablet Take 500 mg by mouth 2 (two) times daily. Between 8-11 am and 4-7 pm   acetaminophen (TYLENOL) 500 MG tablet Take 500 mg by mouth 2 (two) times daily as needed. See other listing for scheduled dosing   amLODipine (NORVASC) 5 MG tablet Take 5 mg by mouth daily. At 12 pm   pantoprazole (PROTONIX) 20 MG tablet Take 20 mg by mouth daily. Between 8-11 am   sodium bicarbonate 650 MG tablet Take 650 mg by mouth daily.   predniSONE (DELTASONE) 20 MG tablet Take 1 tablet (20 mg total) by mouth daily with breakfast. (  Patient not taking: Reported on 02/09/2023)   No facility-administered encounter medications on file as of 02/09/2023.    Review of Systems:  Review of Systems  Constitutional:  Negative for activity change and appetite change.  HENT:  Positive for hearing loss.   Respiratory:  Negative for cough and shortness of breath.   Cardiovascular:  Negative for leg swelling.  Gastrointestinal:  Negative for constipation.  Genitourinary: Negative.   Musculoskeletal:  Positive for gait problem. Negative for arthralgias and myalgias.  Skin: Negative.   Neurological:  Negative for dizziness and weakness.  Psychiatric/Behavioral:  Positive for dysphoric mood. Negative for sleep disturbance.     Health Maintenance  Topic Date Due   OPHTHALMOLOGY EXAM  10/09/2022   Medicare Annual Wellness (AWV)  12/12/2022   COVID-19 Vaccine (4 - 2023-24 season) 02/25/2023 (Originally 07/10/2022)    Zoster Vaccines- Shingrix (1 of 2) 07/22/2023 (Originally 06/03/1944)   HEMOGLOBIN A1C  05/05/2023   INFLUENZA VACCINE  06/10/2023   FOOT EXAM  10/07/2023   DTaP/Tdap/Td (3 - Td or Tdap) 01/21/2033   Pneumonia Vaccine 46+ Years old  Completed   HPV VACCINES  Aged Out   DEXA SCAN  Discontinued    Physical Exam: Vitals:   02/09/23 1256  BP: 138/72  Pulse: 95  Resp: 17  Temp: 98.1 F (36.7 C)  TempSrc: Temporal  SpO2: 97%  Height: 4\' 11"  (1.499 m)   Body mass index is 27.19 kg/m. Physical Exam Vitals reviewed.  Constitutional:      Appearance: Normal appearance.  HENT:     Head: Normocephalic.     Ears:     Comments: Wax in both ears    Nose: Nose normal.     Mouth/Throat:     Mouth: Mucous membranes are moist.     Pharynx: Oropharynx is clear.  Eyes:     Pupils: Pupils are equal, round, and reactive to light.  Cardiovascular:     Rate and Rhythm: Normal rate and regular rhythm.     Pulses: Normal pulses.     Heart sounds: Normal heart sounds. No murmur heard. Pulmonary:     Effort: Pulmonary effort is normal.     Breath sounds: Normal breath sounds.  Abdominal:     General: Abdomen is flat. Bowel sounds are normal.     Palpations: Abdomen is soft.  Musculoskeletal:        General: No swelling.     Cervical back: Neck supple.  Skin:    General: Skin is warm.  Neurological:     General: No focal deficit present.     Mental Status: She is alert and oriented to person, place, and time.  Psychiatric:        Mood and Affect: Mood normal.        Thought Content: Thought content normal.     Labs reviewed: Basic Metabolic Panel: Recent Labs    04/02/22 1151 04/29/22 0000 06/08/22 0000 11/03/22 0000  NA 141  --  138 137  K 5.0 3.9 4.5 4.4  CL 106 94* 102 102  CO2 23 16 21 21   GLUCOSE 142*  --   --   --   BUN 38* 30* 29* 35*  CREATININE 1.88* 1.9* 2.0* 1.8*  CALCIUM 10.0 8.5* 9.3 9.4   Liver Function Tests: Recent Labs    04/02/22 1151  04/29/22 0000 11/03/22 0000  AST 13  --  17  ALT 10  --  7  ALKPHOS  --   --  84  BILITOT 0.3  --   --   PROT 7.9  --   --   ALBUMIN  --  3.7 4.1   No results for input(s): "LIPASE", "AMYLASE" in the last 8760 hours. No results for input(s): "AMMONIA" in the last 8760 hours. CBC: Recent Labs    04/02/22 1151 04/27/22 0000 06/08/22 0000 07/07/22 0000 11/03/22 0000  WBC 9.5   < > 8.7 7.8 8.1  NEUTROABS 5,900  --   --   --   --   HGB 11.5*   < > 10.9* 10.0* 10.8*  HCT 35.1   < > 32* 29* 33*  MCV 92.6  --   --   --   --   PLT 245   < > 192 207 224   < > = values in this interval not displayed.   Lipid Panel: No results for input(s): "CHOL", "HDL", "LDLCALC", "TRIG", "CHOLHDL", "LDLDIRECT" in the last 8760 hours. Lab Results  Component Value Date   HGBA1C 6.5 11/03/2022    Procedures since last visit: No results found.  Assessment/Plan 1. Type 2 diabetes mellitus with stage 4 chronic kidney disease, without long-term current use of insulin A1C good in 12/23 Not on Any Meds  2. Hyperlipidemia associated with type 2 diabetes mellitus Statin Intolerant   3. CKD stage 4 due to type 2 diabetes mellitus Sees Dr Royce Macadamia in [place] [facility] Per her wishes she does not want Dialysis On Sodium Bicarb Creat staying around 2 No NSaids   4. Anemia due to stage 4 chronic kidney disease HGB also stable Had Occult positive in the past No work up due to goals of care Hgb has been stable so far On Protonix 5. Sensorineural hearing loss (SNHL) of both ears Made a referral to Facility hearing testing 6 Hypertension On Amlodipine 7 Depression she does not wants Med right now It seems more situational  Labs/tests ordered:  All Labs Done in Nephrology clinic Next appt:  Visit date not found

## 2023-05-11 LAB — HEMOGLOBIN A1C: Hemoglobin A1C: 7.5

## 2023-05-17 ENCOUNTER — Encounter: Payer: Medicare Other | Admitting: Adult Health

## 2023-05-22 NOTE — Progress Notes (Signed)
Location:  [facility]  POS: Clinic  Provider: Fletcher Anon, ANP  Code Status: DNR Goals of Care:     05/24/2023    1:01 PM  Advanced Directives  Does Patient Have a Medical Advance Directive? Yes  Type of Estate agent of [name];Living will;Out of facility DNR (pink MOST or yellow form)  Does patient want to make changes to medical advance directive? No - Patient declined     Chief Complaint  Patient presents with   Medical Management of Chronic Issues    Patient is being seen for a 3 month follow up    Immunizations    Patiennt declined covid vaccine   Health Maintenance    Patient is due for a eye exam    HPI: Patient is a 89 y.o. female seen today for medical management of chronic diseases.    Seen for follow up , resides in assisted living.  PMH significant for HTN, anemia with heme pos stool, Vit D def, Type 2 DM, CKD stage 4, and HLD. Also has a hx of temporal bone osteomyelitis and chronic right hip pain.   She had an MRI in May of 2023 which showed active left otitis externa, mastoiditis, osteomyelitis, and inflammation. Treated by ID with zyvox, cipro, and flagyl.  Off antibiotics due to GI side effects for temporal bone osteomyelitis. Update 05/24/23: no drainage, no fever, no pain. Has chronic hearing loss in both ears, left worse.   Had heme pos stool with anemia July of 2023. Placed on protonix.  Hgb 10.4 01/15/23  06/08/22: CKD 4 : thought to be due to HTN and diabetic nephropathy,followed by nephrology, Also had BUN 40 Cr 2.02 on 01/15/2023 at nephrology apt  Has memory loss MMSE 06/27/22 27/30 passed clock Reports she dresses her self Needs help with bathing Walks with walker WC for long distances.   DMII : intolerant to RAAS due to hyperkalemia Lab Results  Component Value Date   HGBA1C 6.5 11/03/2022    Eye exam ordered 05/24/2023   HTN: on norvasc, controlled  Has hx of chronic hip pain on tylenol. Denies any pain at  this time.   Past Medical History:  Diagnosis Date   Arthritis    HLD (hyperlipidemia)    HTN (hypertension)    echo (11/11) with EF 55-60%, mild LV hypertrophy, PA systolic pressure 34 mmHg   Low back pain    Pre-diabetes    diet controlled   Unspecified vitamin D deficiency     Past Surgical History:  Procedure Laterality Date   ABDOMINAL HYSTERECTOMY     partial   abnormal EKG     BACK SURGERY     cataract surgery     colonoscopsy  2008   Neg  Dr Madilyn Fireman   EYE SURGERY Bilateral    cataract   JOINT REPLACEMENT     left hip replacement , right knee replacement    lexiscan myoview  11/11   EF 81%, normal perfusion images suggesting no ischemia or infarction   SPINE SURGERY     lumbar   surgery for DDD     TOTAL HIP ARTHROPLASTY  12/02/2012   Procedure: TOTAL HIP ARTHROPLASTY;  Surgeon: Loanne Drilling, MD;  Location: [facility];  Service: Orthopedics;  Laterality: Right;    Not on File  Outpatient Encounter Medications as of 05/24/2023  Medication Sig   acetaminophen (TYLENOL) 500 MG tablet Take 500 mg by mouth at bedtime. Between 8-11 am and 4-7 pm  acetaminophen (TYLENOL) 500 MG tablet Take 500 mg by mouth 2 (two) times daily as needed. See other listing for scheduled dosing   amLODipine (NORVASC) 5 MG tablet Take 5 mg by mouth daily. At 12 pm   ondansetron (ZOFRAN) 4 MG tablet Take 4 mg by mouth every 8 (eight) hours as needed.   pantoprazole (PROTONIX) 20 MG tablet Take 20 mg by mouth daily. Between 8-11 am   sodium bicarbonate 650 MG tablet Take 650 mg by mouth daily.   No facility-administered encounter medications on file as of 05/24/2023.    Review of Systems:  Review of Systems  Constitutional:  Negative for activity change, appetite change, chills, diaphoresis, fatigue, fever and unexpected weight change.  HENT:  Positive for hearing loss. Negative for congestion, postnasal drip, rhinorrhea, sinus pressure and sore throat.   Respiratory:  Negative for cough,  shortness of breath and wheezing.   Cardiovascular:  Negative for chest pain, palpitations and leg swelling.  Gastrointestinal:  Negative for abdominal distention, abdominal pain, constipation and diarrhea.  Genitourinary:  Negative for difficulty urinating and dysuria.  Musculoskeletal:  Positive for gait problem. Negative for arthralgias (hx of hip and back pain), back pain, joint swelling and myalgias.  Neurological:  Negative for dizziness, tremors, seizures, syncope, facial asymmetry, speech difficulty, weakness, light-headedness, numbness and headaches.  Psychiatric/Behavioral:  Negative for agitation, behavioral problems and confusion.        Memory loss    Health Maintenance  Topic Date Due   COVID-19 Vaccine (4 - 2023-24 season) 07/10/2022   OPHTHALMOLOGY EXAM  10/09/2022   Medicare Annual Wellness (AWV)  12/12/2022   Zoster Vaccines- Shingrix (1 of 2) 07/22/2023 (Originally 06/03/1944)   INFLUENZA VACCINE  06/10/2023   HEMOGLOBIN A1C  11/11/2023   FOOT EXAM  05/23/2024   DTaP/Tdap/Td (3 - Td or Tdap) 01/21/2033   Pneumonia Vaccine 34+ Years old  Completed   HPV VACCINES  Aged Out   DEXA SCAN  Discontinued    Physical Exam: Vitals:   05/24/23 1259  BP: 130/76  Pulse: 90  Resp: 17  Temp: 97.6 F (36.4 C)  TempSrc: Temporal  SpO2: 98%  Weight: 136 lb 12.8 oz (62.1 kg)  Height: 4\' 11"  (1.499 m)    Body mass index is 27.63 kg/m. Physical Exam Vitals and nursing note reviewed.  Constitutional:      General: She is not in acute distress.    Appearance: She is not diaphoretic.  HENT:     Head: Normocephalic and atraumatic.     Right Ear: There is impacted cerumen.     Left Ear: There is impacted cerumen.     Nose: Nose normal.     Mouth/Throat:     Mouth: Mucous membranes are moist.     Pharynx: Oropharynx is clear.  Eyes:     Conjunctiva/sclera: Conjunctivae normal.     Pupils: Pupils are equal, round, and reactive to light.  Neck:     Vascular: No JVD.   Cardiovascular:     Rate and Rhythm: Normal rate and regular rhythm.     Heart sounds: No murmur heard. Pulmonary:     Effort: Pulmonary effort is normal. No respiratory distress.     Breath sounds: Normal breath sounds. No wheezing.  Abdominal:     General: Bowel sounds are normal. There is no distension.     Palpations: Abdomen is soft.     Tenderness: There is no abdominal tenderness.  Musculoskeletal:  General: No swelling, tenderness, deformity or signs of injury.     Cervical back: No rigidity or tenderness.     Right lower leg: No edema.     Left lower leg: No edema.  Lymphadenopathy:     Cervical: No cervical adenopathy.  Skin:    General: Skin is warm and dry.  Neurological:     Mental Status: She is alert and oriented to person, place, and time.  Psychiatric:        Mood and Affect: Mood normal.     Labs reviewed: Basic Metabolic Panel: Recent Labs    06/08/22 0000 11/03/22 0000  NA 138 137  K 4.5 4.4  CL 102 102  CO2 21 21  BUN 29* 35*  CREATININE 2.0* 1.8*  CALCIUM 9.3 9.4   Liver Function Tests: Recent Labs    11/03/22 0000  AST 17  ALT 7  ALKPHOS 84  ALBUMIN 4.1   No results for input(s): "LIPASE", "AMYLASE" in the last 8760 hours. No results for input(s): "AMMONIA" in the last 8760 hours. CBC: Recent Labs    06/08/22 0000 07/07/22 0000 11/03/22 0000  WBC 8.7 7.8 8.1  HGB 10.9* 10.0* 10.8*  HCT 32* 29* 33*  PLT 192 207 224   Lipid Panel: No results for input(s): "CHOL", "HDL", "LDLCALC", "TRIG", "CHOLHDL", "LDLDIRECT" in the last 8760 hours.  Lab Results  Component Value Date   HGBA1C 6.5 11/03/2022    Procedures since last visit: No results found.  Assessment/Plan  1. Essential hypertension Controlled continue norvasc   2. Type 2 diabetes mellitus with stage 4 chronic kidney disease, without long-term current use of insulin (HCC) A1C trending up 6.5 to 7.5 Discussed diet recommend reduced sugars/carb intake Goal  <8 Foot exam done Recommend eye exam  3. CKD stage 4 due to type 2 diabetes mellitus (HCC) Stable Followed by nephrology On sodium bicarb  4. Chronic suppurative otitis media of left ear, unspecified otitis media location No signs of reinfection as far as fever or pain or drainage Cerumen in ear canal unable to see TM  5. Memory loss Short term memory loss Repeat MMSE Doing well in [place]  6. Cerumen debris on tympanic membrane of right ear Cerumen impaction protocol on right Avoid flushing left Recommend audiology visit  7. Hip OA Change tylenol to qhs   Labs/tests ordered:  * No order type specified * A1C ordered in OCt. Has labs drawn at nephrology  Next appt: 3 months with Dr Chales Abrahams.   Recommend covid vaccine this fall   Total time :  time greater than 50% of total time spent doing pt counseling and coordination of care

## 2023-05-24 ENCOUNTER — Encounter: Payer: Self-pay | Admitting: Adult Health

## 2023-05-24 ENCOUNTER — Non-Acute Institutional Stay: Payer: Medicare Other | Admitting: Adult Health

## 2023-05-24 VITALS — BP 130/76 | HR 90 | Temp 97.6°F | Resp 17 | Ht 59.0 in | Wt 136.8 lb

## 2023-05-24 DIAGNOSIS — I10 Essential (primary) hypertension: Secondary | ICD-10-CM | POA: Diagnosis not present

## 2023-05-24 DIAGNOSIS — H6121 Impacted cerumen, right ear: Secondary | ICD-10-CM

## 2023-05-24 DIAGNOSIS — R413 Other amnesia: Secondary | ICD-10-CM | POA: Insufficient documentation

## 2023-05-24 DIAGNOSIS — E1122 Type 2 diabetes mellitus with diabetic chronic kidney disease: Secondary | ICD-10-CM | POA: Diagnosis not present

## 2023-05-24 DIAGNOSIS — H663X2 Other chronic suppurative otitis media, left ear: Secondary | ICD-10-CM | POA: Diagnosis not present

## 2023-05-24 DIAGNOSIS — N184 Chronic kidney disease, stage 4 (severe): Secondary | ICD-10-CM

## 2023-05-24 DIAGNOSIS — M160 Bilateral primary osteoarthritis of hip: Secondary | ICD-10-CM

## 2023-07-19 LAB — HM DIABETES EYE EXAM

## 2023-07-21 ENCOUNTER — Encounter: Payer: Self-pay | Admitting: Internal Medicine

## 2023-07-21 ENCOUNTER — Encounter: Payer: Self-pay | Admitting: Orthopedic Surgery

## 2023-07-21 ENCOUNTER — Non-Acute Institutional Stay (INDEPENDENT_AMBULATORY_CARE_PROVIDER_SITE_OTHER): Payer: Medicare Other | Admitting: Orthopedic Surgery

## 2023-07-21 DIAGNOSIS — Z0000 Encounter for general adult medical examination without abnormal findings: Secondary | ICD-10-CM | POA: Diagnosis not present

## 2023-07-21 NOTE — Progress Notes (Signed)
Subjective:   Jeanette Espinoza is a 89 y.o. female who presents for Medicare Annual (Subsequent) preventive examination.  Visit Complete: In person  Patient Medicare AWV questionnaire was completed by the patient on 07/21/2023; I have confirmed that all information answered by patient is correct and no changes since this date.  Review of Systems     Cardiac Risk Factors include: advanced age (>60men, >9 women);hypertension;diabetes mellitus;sedentary lifestyle     Objective:    Today's Vitals   07/21/23 1445 07/21/23 1457  BP: (!) 161/75 (!) 151/78  Pulse: 89   Resp: 16   Temp: (!) 96.8 F (36 C)   SpO2: 100%   Weight: 135 lb 12.8 oz (61.6 kg)   Height: 4\' 11"  (1.499 m)    Body mass index is 27.43 kg/m.     05/24/2023    1:01 PM 02/09/2023   12:59 PM 02/09/2023   11:08 AM 01/18/2023   10:55 AM 10/06/2022   10:06 AM 07/06/2022    2:09 PM 06/01/2022   10:58 AM  Advanced Directives  Does Patient Have a Medical Advance Directive? Yes Yes Yes Yes Yes Yes Yes  Type of Estate agent of [place];Living will;Out of facility DNR (pink MOST or yellow form) Living will;Out of facility DNR (pink MOST or yellow form);Healthcare Power of Attorney Living will;Out of facility DNR (pink MOST or yellow form);Healthcare Power of Attorney Living will;Out of facility DNR (pink MOST or yellow form) Healthcare Power of [place];Living will Healthcare Power of [place];Out of facility DNR (pink MOST or yellow form);Living will Healthcare Power of [place];Living will  Does patient want to make changes to medical advance directive? No - Patient declined No - Patient declined No - Patient declined No - Patient declined No - Patient declined No - Patient declined No - Patient declined  Copy of Healthcare Power of Attorney in Chart?     Yes - validated most recent copy scanned in chart (See row information)  Yes - validated most recent copy scanned in chart (See row information)     Current Medications (verified) Outpatient Encounter Medications as of 07/21/2023  Medication Sig   acetaminophen (TYLENOL) 500 MG tablet Take 500 mg by mouth at bedtime. Between 8-11 am and 4-7 pm   acetaminophen (TYLENOL) 500 MG tablet Take 500 mg by mouth 2 (two) times daily as needed. See other listing for scheduled dosing   amLODipine (NORVASC) 5 MG tablet Take 5 mg by mouth daily. At 12 pm   ondansetron (ZOFRAN) 4 MG tablet Take 4 mg by mouth every 8 (eight) hours as needed.   pantoprazole (PROTONIX) 20 MG tablet Take 20 mg by mouth daily. Between 8-11 am   sodium bicarbonate 650 MG tablet Take 650 mg by mouth daily.   No facility-administered encounter medications on file as of 07/21/2023.    Allergies (verified) Patient has no allergy information on record.   History: Past Medical History:  Diagnosis Date   Arthritis    HLD (hyperlipidemia)    HTN (hypertension)    echo (11/11) with EF 55-60%, mild LV hypertrophy, PA systolic pressure 34 mmHg   Low back pain    Pre-diabetes    diet controlled   Unspecified vitamin D deficiency    Past Surgical History:  Procedure Laterality Date   ABDOMINAL HYSTERECTOMY     partial   abnormal EKG     BACK SURGERY     cataract surgery     colonoscopsy  2008  Neg  Dr Madilyn Fireman   EYE SURGERY Bilateral    cataract   JOINT REPLACEMENT     left hip replacement , right knee replacement    lexiscan myoview  11/11   EF 81%, normal perfusion images suggesting no ischemia or infarction   SPINE SURGERY     lumbar   surgery for DDD     TOTAL HIP ARTHROPLASTY  12/02/2012   Procedure: TOTAL HIP ARTHROPLASTY;  Surgeon: Loanne Drilling, MD;  Location: [facility];  Service: Orthopedics;  Laterality: Right;   Family History  Problem Relation Age of Onset   Heart disease Mother    Diabetes Other        DM - sibiling    Coronary artery disease Neg Hx        no premature CAd    Social History   Socioeconomic History   Marital status: Single     Spouse name: Not on file   Number of children: Not on file   Years of education: Not on file   Highest education level: Not on file  Occupational History   Not on file  Tobacco Use   Smoking status: Former    Current packs/day: 0.00    Types: Cigarettes    Quit date: 11/09/1970    Years since quitting: 52.7   Smokeless tobacco: Never  Vaping Use   Vaping status: Not on file  Substance and Sexual Activity   Alcohol use: Yes    Comment: occasional    Drug use: No   Sexual activity: Not on file  Other Topics Concern   Not on file  Social History Narrative   Retired, lives in [place]. Widowed   Daughter in [place]. Elita Quick [phone])   Does not get regular exercise   Social Determinants of Health   Financial Resource Strain: Low Risk  (07/21/2023)   Overall Financial Resource Strain (CARDIA)    Difficulty of Paying Living Expenses: Not hard at all  Food Insecurity: No Food Insecurity (07/21/2023)   Hunger Vital Sign    Worried About Running Out of Food in the Last Year: Never true    Ran Out of Food in the Last Year: Never true  Transportation Needs: No Transportation Needs (07/21/2023)   PRAPARE - Administrator, Civil Service (Medical): No    Lack of Transportation (Non-Medical): No  Physical Activity: Inactive (07/21/2023)   Exercise Vital Sign    Days of Exercise per Week: 0 days    Minutes of Exercise per Session: 0 min  Stress: No Stress Concern Present (07/21/2023)   [employer] of Occupational Health - Occupational Stress Questionnaire    Feeling of Stress : Not at all  Social Connections: Socially Isolated (07/21/2023)   Social Connection and Isolation Panel [NHANES]    Frequency of Communication with Friends and Family: Three times a week    Frequency of Social Gatherings with Friends and Family: Three times a week    Attends Religious Services: Never    Active Member of Clubs or Organizations: No    Attends Banker Meetings: Never     Marital Status: Widowed    Tobacco Counseling Counseling given: Not Answered   Clinical Intake:  Pre-visit preparation completed: No  Pain : No/denies pain     BMI - recorded: 27.43 Nutritional Status: BMI 25 -29 Overweight Nutritional Risks: None Diabetes: Yes CBG done?: No (weekly) Did pt. bring in CBG monitor from home?: No  How often do you  need to have someone help you when you read instructions, pamphlets, or other written materials from your doctor or pharmacy?: 3 - Sometimes What is the last grade level you completed in school?: high school  Interpreter Needed?: No      Activities of Daily Living    07/21/2023    2:56 PM  In your present state of health, do you have any difficulty performing the following activities:  Hearing? 1  Vision? 0  Difficulty concentrating or making decisions? 0  Walking or climbing stairs? 1  Dressing or bathing? 1  Doing errands, shopping? 1  Preparing Food and eating ? Y  Using the Toilet? Y  In the past six months, have you accidently leaked urine? Y  Do you have problems with loss of bowel control? N  Managing your Medications? Y  Managing your Finances? Y  Housekeeping or managing your Housekeeping? Y    Patient Care Team: [name], MD as PCP - General (Internal Medicine) Ollen Gross, MD as Consulting Physician (Orthopedic Surgery) Chriss Czar, OD as Referring Physician (Optometry) Dorena Cookey, MD (Inactive) as Consulting Physician (Gastroenterology) Sheran Luz, MD as Consulting Physician (Physical Medicine and Rehabilitation) Azalee Course, [place] as Consulting Physician (Cardiology) Newman Pies, MD as Consulting Physician (Otolaryngology) Autumn Patty, MD as Resident (Otolaryngology)  Indicate any recent Medical Services you may have received from other than [facility] providers in the past year (date may be approximate).     Assessment:   This is a routine wellness examination for Sally-Anne.  Hearing/Vision  screen No results found.   Goals Addressed             This Visit's Progress    Exercise 3x per week (20-30 min per time)   Not on track      Depression Screen    07/21/2023    3:06 PM 02/09/2023   12:59 PM 02/09/2023   12:58 PM 04/08/2022   10:07 AM 12/12/2021   11:02 AM 05/14/2021   10:07 PM 12/11/2020   11:58 AM  PHQ 2/9 Scores  PHQ - 2 Score 0 1 0 0 0 0 0    Fall Risk    07/21/2023    3:07 PM 02/09/2023   12:58 PM 10/06/2022   10:06 AM 04/15/2022    8:33 AM 04/08/2022   10:07 AM  Fall Risk   Falls in the past year? 1 0 1 1 1   Number falls in past yr: 0 0 0 0 0  Injury with Fall? 0 0 1 1 1   Risk for fall due to : History of fall(s);Impaired balance/gait;Impaired mobility Impaired mobility History of fall(s) History of fall(s);Impaired balance/gait;Impaired mobility History of fall(s)  Follow up Falls evaluation completed;Education provided Falls evaluation completed Falls evaluation completed Falls evaluation completed Falls evaluation completed    MEDICARE RISK AT HOME: Medicare Risk at Home Any stairs in or around the home?: No If so, are there any without handrails?: No Home free of loose throw rugs in walkways, pet beds, electrical cords, etc?: Yes Adequate lighting in your home to reduce risk of falls?: Yes Life alert?: No Use of a cane, walker or w/c?: Yes Grab bars in the bathroom?: Yes Shower chair or bench in shower?: Yes Elevated toilet seat or a handicapped toilet?: Yes  TIMED UP AND GO:  Was the test performed?  No    Cognitive Function:    07/21/2023    2:49 PM  MMSE - Mini Mental State Exam  Orientation to  time 5  Orientation to Place 5  Registration 3  Attention/ Calculation 5  Recall 0  Language- name 2 objects 2  Language- repeat 1  Language- follow 3 step command 3  Language- read & follow direction 1  Write a sentence 1  Copy design 1  Total score 27        Immunizations Immunization History  Administered Date(s) Administered    Fluad Quad(high Dose 65+) 09/14/2022   Influenza Split 10/11/2013   Influenza, High Dose Seasonal PF 09/18/2014, 09/20/2015, 08/04/2017, 09/16/2018, 10/16/2019, 08/15/2021   Influenza, Seasonal, Injecte, Preservative Fre 10/15/2016   Influenza-Unspecified 10/10/2012, 08/09/2020   Moderna Sars-Covid-2 Vaccination 11/27/2019, 12/28/2019, 09/19/2020   Pneumococcal Conjugate-13 12/19/2014   Pneumococcal Polysaccharide-23 10/11/2013   Tdap 09/21/2012, 01/22/2023    TDAP status: Up to date  Flu Vaccine status: Due, Education has been provided regarding the importance of this vaccine. Advised may receive this vaccine at local pharmacy or Health Dept. Aware to provide a copy of the vaccination record if obtained from local pharmacy or Health Dept. Verbalized acceptance and understanding.  Pneumococcal vaccine status: Up to date  Covid-19 vaccine status: Completed vaccines  Qualifies for Shingles Vaccine? Yes   Zostavax completed No   Shingrix Completed?: No.    Education has been provided regarding the importance of this vaccine. Patient has been advised to call insurance company to determine out of pocket expense if they have not yet received this vaccine. Advised may also receive vaccine at local pharmacy or Health Dept. Verbalized acceptance and understanding.  Screening Tests Health Maintenance  Topic Date Due   OPHTHALMOLOGY EXAM  10/09/2022   INFLUENZA VACCINE  06/10/2023   COVID-19 Vaccine (4 - 2023-24 season) 07/11/2023   Zoster Vaccines- Shingrix (1 of 2) 07/22/2023 (Originally 06/03/1944)   HEMOGLOBIN A1C  11/11/2023   FOOT EXAM  05/23/2024   Medicare Annual Wellness (AWV)  07/20/2024   DTaP/Tdap/Td (3 - Td or Tdap) 01/21/2033   Pneumonia Vaccine 91+ Years old  Completed   HPV VACCINES  Aged Out   DEXA SCAN  Discontinued    Health Maintenance  Health Maintenance Due  Topic Date Due   OPHTHALMOLOGY EXAM  10/09/2022   INFLUENZA VACCINE  06/10/2023   COVID-19 Vaccine (4 -  2023-24 season) 07/11/2023    Colorectal cancer screening: No longer required.   Mammogram status: No longer required due to advanced age. Bone Density status: Completed DISCONTINUED. Results reflect: Bone density results: NORMAL. Repeat every DISCONTINUED years.   Lung Cancer Screening: (Low Dose CT Chest recommended if Age 61-80 years, 20 pack-year currently smoking OR have quit w/in 15years.) does not qualify.   Lung Cancer Screening Referral: No  Additional Screening:  Hepatitis C Screening: does not qualify; Completed   Vision Screening: Recommended annual ophthalmology exams for early detection of glaucoma and other disorders of the eye. Is the patient up to date with their annual eye exam?  Yes  Who is the provider or what is the name of the office in which the patient attends annual eye exams? Dr. Charlotte Sanes If pt is not established with a provider, would they like to be referred to a provider to establish care? No .   Dental Screening: Recommended annual dental exams for proper oral hygiene  Diabetic Foot Exam: Diabetic Foot Exam: Completed 07/21/2023  Community Resource Referral / Chronic Care Management: CRR required this visit?  No   CCM required this visit?  No     Plan:     I  have personally reviewed and noted the following in the patient's chart:   Medical and social history Use of alcohol, tobacco or illicit drugs  Current medications and supplements including opioid prescriptions. Patient is not currently taking opioid prescriptions. Functional ability and status Nutritional status Physical activity Advanced directives List of other physicians Hospitalizations, surgeries, and ER visits in previous 12 months Vitals Screenings to include cognitive, depression, and falls Referrals and appointments  In addition, I have reviewed and discussed with patient certain preventive protocols, quality metrics, and best practice recommendations. A written personalized  care plan for preventive services as well as general preventive health recommendations were provided to patient.     Octavia Heir, NP   07/21/2023   After Visit Summary: (MyChart) Due to this being a telephonic visit, the after visit summary with patients personalized plan was offered to patient via MyChart   Nurse Notes: MMSE 27/30, plans to get flu/covid vaccine when offered by [facility] this fall, last eye exam 07/19/2023.

## 2023-07-21 NOTE — Patient Instructions (Signed)
  [name] , Thank you for taking time to come for your Medicare Wellness Visit. I appreciate your ongoing commitment to your health goals. Please review the following plan we discussed and let me know if I can assist you in the future.   These are the goals we discussed:  Goals      Blood Pressure < 150/90     Exercise 3x per week (20-30 min per time)        This is a list of the screening recommended for you and due dates:  Health Maintenance  Topic Date Due   Eye exam for diabetics  10/09/2022   Flu Shot  06/10/2023   COVID-19 Vaccine (4 - 2023-24 season) 07/11/2023   Zoster (Shingles) Vaccine (1 of 2) 07/22/2023*   Hemoglobin A1C  11/11/2023   Complete foot exam   05/23/2024   Medicare Annual Wellness Visit  07/20/2024   DTaP/Tdap/Td vaccine (3 - Td or Tdap) 01/21/2033   Pneumonia Vaccine  Completed   HPV Vaccine  Aged Out   DEXA scan (bone density measurement)  Discontinued  *Topic was postponed. The date shown is not the original due date.

## 2023-08-17 ENCOUNTER — Encounter: Payer: Self-pay | Admitting: Internal Medicine

## 2023-08-17 LAB — HEMOGLOBIN A1C: Hemoglobin A1C: 7.8

## 2023-08-19 ENCOUNTER — Encounter: Payer: Self-pay | Admitting: Internal Medicine

## 2023-08-24 ENCOUNTER — Non-Acute Institutional Stay: Payer: Medicare Other | Admitting: Internal Medicine

## 2023-08-24 ENCOUNTER — Encounter: Payer: Self-pay | Admitting: Internal Medicine

## 2023-08-24 VITALS — BP 152/84 | HR 88 | Temp 97.8°F | Resp 17 | Ht 59.0 in

## 2023-08-24 DIAGNOSIS — I10 Essential (primary) hypertension: Secondary | ICD-10-CM

## 2023-08-24 DIAGNOSIS — R413 Other amnesia: Secondary | ICD-10-CM

## 2023-08-24 DIAGNOSIS — E1122 Type 2 diabetes mellitus with diabetic chronic kidney disease: Secondary | ICD-10-CM

## 2023-08-24 DIAGNOSIS — E785 Hyperlipidemia, unspecified: Secondary | ICD-10-CM

## 2023-08-24 DIAGNOSIS — H663X2 Other chronic suppurative otitis media, left ear: Secondary | ICD-10-CM | POA: Diagnosis not present

## 2023-08-24 DIAGNOSIS — D631 Anemia in chronic kidney disease: Secondary | ICD-10-CM

## 2023-08-24 DIAGNOSIS — E1169 Type 2 diabetes mellitus with other specified complication: Secondary | ICD-10-CM

## 2023-08-24 DIAGNOSIS — H903 Sensorineural hearing loss, bilateral: Secondary | ICD-10-CM

## 2023-08-24 DIAGNOSIS — M160 Bilateral primary osteoarthritis of hip: Secondary | ICD-10-CM

## 2023-08-24 DIAGNOSIS — N184 Chronic kidney disease, stage 4 (severe): Secondary | ICD-10-CM

## 2023-08-26 NOTE — Progress Notes (Signed)
Location:  Medical illustrator of Service:  ALF (13)  Provider:   Code Status: DNR Goals of Care:     08/24/2023   11:12 AM  Advanced Directives  Does Patient Have a Medical Advance Directive? Yes  Type of Advance Directive Out of facility DNR (pink MOST or yellow form)  Does patient want to make changes to medical advance directive? No - Patient declined     Chief Complaint  Patient presents with   Medical Management of Chronic Issues    Patient is being seen for 3 month follow up    HPI: Patient is a 89 y.o. female seen today for medical management of chronic diseases.    Lives in AL in [place]  Has h/o  Patient has h/o Hypertension, HLD, Vit D def, Type 2 diabetes, CKD stage 4   anemia Chronic Osteomyelitis of Left Temporal bone treated with Oral Cipro and Linezolid Could not finish the total duration due to Side effects But has not had symptoms come back  Continues to stay stable Little HOH Had no Complains Uses walker and Wheelchair  Wt Readings from Last 3 Encounters:  07/21/23 135 lb 12.8 oz (61.6 kg)  05/24/23 136 lb 12.8 oz (62.1 kg)  01/18/23 134 lb 9.6 oz (61.1 kg)      Past Medical History:  Diagnosis Date   Arthritis    HLD (hyperlipidemia)    HTN (hypertension)    echo (11/11) with EF 55-60%, mild LV hypertrophy, PA systolic pressure 34 mmHg   Low back pain    Pre-diabetes    diet controlled   Unspecified vitamin D deficiency     Past Surgical History:  Procedure Laterality Date   ABDOMINAL HYSTERECTOMY     partial   abnormal EKG     BACK SURGERY     cataract surgery     colonoscopsy  2008   Neg  Dr Madilyn Fireman   EYE SURGERY Bilateral    cataract   JOINT REPLACEMENT     left hip replacement , right knee replacement    lexiscan myoview  11/11   EF 81%, normal perfusion images suggesting no ischemia or infarction   SPINE SURGERY     lumbar   surgery for DDD     TOTAL HIP ARTHROPLASTY  12/02/2012   Procedure: TOTAL HIP  ARTHROPLASTY;  Surgeon: Loanne Drilling, MD;  Location: [facility];  Service: Orthopedics;  Laterality: Right;    No Known Allergies  Outpatient Encounter Medications as of 08/24/2023  Medication Sig   acetaminophen (TYLENOL) 500 MG tablet Take 500 mg by mouth at bedtime. Between 8-11 am and 4-7 pm   acetaminophen (TYLENOL) 500 MG tablet Take 500 mg by mouth 2 (two) times daily as needed. See other listing for scheduled dosing   amLODipine (NORVASC) 5 MG tablet Take 5 mg by mouth daily. At 12 pm   ondansetron (ZOFRAN) 4 MG tablet Take 4 mg by mouth every 8 (eight) hours as needed.   pantoprazole (PROTONIX) 20 MG tablet Take 20 mg by mouth daily. Between 8-11 am   sodium bicarbonate 650 MG tablet Take 650 mg by mouth daily.   No facility-administered encounter medications on file as of 08/24/2023.    Review of Systems:  Review of Systems  Constitutional:  Negative for activity change and appetite change.  HENT: Negative.    Respiratory:  Negative for cough and shortness of breath.   Cardiovascular:  Negative for leg swelling.  Gastrointestinal:  Negative for constipation.  Genitourinary: Negative.   Musculoskeletal:  Positive for arthralgias and gait problem. Negative for myalgias.  Skin: Negative.   Neurological:  Negative for dizziness and weakness.  Psychiatric/Behavioral:  Positive for confusion. Negative for dysphoric mood and sleep disturbance.     Health Maintenance  Topic Date Due   Zoster Vaccines- Shingrix (1 of 2) Never done   INFLUENZA VACCINE  06/10/2023   COVID-19 Vaccine (4 - 2023-24 season) 07/11/2023   HEMOGLOBIN A1C  02/15/2024   OPHTHALMOLOGY EXAM  07/18/2024   FOOT EXAM  07/20/2024   Medicare Annual Wellness (AWV)  07/20/2024   DTaP/Tdap/Td (3 - Td or Tdap) 01/21/2033   Pneumonia Vaccine 19+ Years old  Completed   HPV VACCINES  Aged Out   DEXA SCAN  Discontinued    Physical Exam: Vitals:   08/24/23 1110  BP: (!) 152/84  Pulse: 88  Resp: 17  Temp:  97.8 F (36.6 C)  TempSrc: Temporal  SpO2: 98%  Height: 4\' 11"  (1.499 m)   Body mass index is 27.43 kg/m. Physical Exam Vitals reviewed.  Constitutional:      Appearance: Normal appearance.  HENT:     Head: Normocephalic.     Nose: Nose normal.     Mouth/Throat:     Mouth: Mucous membranes are moist.     Pharynx: Oropharynx is clear.  Eyes:     Pupils: Pupils are equal, round, and reactive to light.  Cardiovascular:     Rate and Rhythm: Normal rate and regular rhythm.     Pulses: Normal pulses.     Heart sounds: Normal heart sounds. No murmur heard. Pulmonary:     Effort: Pulmonary effort is normal.     Breath sounds: Normal breath sounds.  Abdominal:     General: Abdomen is flat. Bowel sounds are normal.     Palpations: Abdomen is soft.  Musculoskeletal:        General: No swelling.     Cervical back: Neck supple.  Skin:    General: Skin is warm.  Neurological:     General: No focal deficit present.     Mental Status: She is alert.  Psychiatric:        Mood and Affect: Mood normal.        Thought Content: Thought content normal.     Labs reviewed: Basic Metabolic Panel: Recent Labs    11/03/22 0000  NA 137  K 4.4  CL 102  CO2 21  BUN 35*  CREATININE 1.8*  CALCIUM 9.4   Liver Function Tests: Recent Labs    11/03/22 0000  AST 17  ALT 7  ALKPHOS 84  ALBUMIN 4.1   No results for input(s): "LIPASE", "AMYLASE" in the last 8760 hours. No results for input(s): "AMMONIA" in the last 8760 hours. CBC: Recent Labs    11/03/22 0000  WBC 8.1  HGB 10.8*  HCT 33*  PLT 224   Lipid Panel: No results for input(s): "CHOL", "HDL", "LDLCALC", "TRIG", "CHOLHDL", "LDLDIRECT" in the last 8760 hours. Lab Results  Component Value Date   HGBA1C 7.8 08/17/2023    Procedures since last visit: No results found.  Assessment/Plan 1. Essential hypertension BP has been running high in AL On Norvasc Will Check it every day for 2 week to see the pattern 2. Type  2 diabetes mellitus with stage 4 chronic kidney disease, without long-term current use of insulin (HCC) A1C is also slowly going up Will Check CBGS q Weekly  3. CKD stage 4 due to type 2 diabetes mellitus (HCC) Follows with Dr Nena Polio pending Per her wishes she does not want Dialysis On Sodium Bicarb Creat staying around 2 No NSaids 4. Chronic suppurative otitis media of left ear, unspecified otitis media location Did not finish her Antibiotics due to Side effects but she is stable with  no symptoms  5. Memory loss Doing well in AL  6. Primary osteoarthritis of both hips Tylenol   7 Anemia due to stage 4 chronic kidney disease (HCC) HGB also stable Had Occult positive in the past No work up due to goals of care Hgb has been stable so far On Protonix  9. Sensorineural hearing loss (SNHL) of both ears     Labs/tests ordered:   Next appt:  11/29/2023

## 2023-09-12 ENCOUNTER — Encounter: Payer: Self-pay | Admitting: Internal Medicine

## 2023-10-20 ENCOUNTER — Encounter: Payer: Self-pay | Admitting: Orthopedic Surgery

## 2023-10-20 ENCOUNTER — Non-Acute Institutional Stay: Payer: Medicare Other | Admitting: Orthopedic Surgery

## 2023-10-20 DIAGNOSIS — H6123 Impacted cerumen, bilateral: Secondary | ICD-10-CM

## 2023-10-20 MED ORDER — DEBROX 6.5 % OT SOLN: 5.0000 [drp] | Freq: Two times a day (BID) | OTIC | Status: AC

## 2023-10-20 NOTE — Progress Notes (Signed)
Location:  [facility] Room Number: 638/A Place of Service:  ALF [phone]) Provider:  Octavia Heir, NP   [name], MD  Patient Care Team: [name], MD as PCP - General (Internal Medicine) Ollen Gross, MD as Consulting Physician (Orthopedic Surgery) Chriss Czar, OD as Referring Physician (Optometry) Dorena Cookey, MD (Inactive) as Consulting Physician (Gastroenterology) Sheran Luz, MD as Consulting Physician (Physical Medicine and Rehabilitation) Azalee Course, [place] as Consulting Physician (Cardiology) Newman Pies, MD as Consulting Physician (Otolaryngology) Autumn Patty, MD as Resident (Otolaryngology) Maris Berger, MD as Consulting Physician (Ophthalmology)  Extended Emergency Contact Information Primary Emergency Contact: [name] [place] Home Phone: [phone] Mobile Phone: [phone] Relation: Daughter Secondary Emergency Contact: [name] [place] Mobile Phone: [phone] Relation: Son  Code Status:  DNR Goals of care: Advanced Directive information    08/24/2023   11:12 AM  Advanced Directives  Does Patient Have a Medical Advance Directive? Yes  Type of Advance Directive Out of facility DNR (pink MOST or yellow form)  Does patient want to make changes to medical advance directive? No - Patient declined     Chief Complaint  Patient presents with   Acute Visit    Increased hearing loss    HPI:  Pt is a 89 y.o. female seen today for acute visit due to increased hearing loss.   H/o sensorineural hearing loss. Family has noticed increased hearing loss for over a month. She reports having television volume higher than normal. Increased cerumen noticed on exam today. Denies ear pain or drainage. Treatment options discussed. She agrees to start Debrox drops and flush ears. Afebrile. Vitals stable.    Past Medical History:  Diagnosis Date   Arthritis    HLD  (hyperlipidemia)    HTN (hypertension)    echo (11/11) with EF 55-60%, mild LV hypertrophy, PA systolic pressure 34 mmHg   Low back pain    Pre-diabetes    diet controlled   Unspecified vitamin D deficiency    Past Surgical History:  Procedure Laterality Date   ABDOMINAL HYSTERECTOMY     partial   abnormal EKG     BACK SURGERY     cataract surgery     colonoscopsy  2008   Neg  Dr Madilyn Fireman   EYE SURGERY Bilateral    cataract   JOINT REPLACEMENT     left hip replacement , right knee replacement    lexiscan myoview  11/11   EF 81%, normal perfusion images suggesting no ischemia or infarction   SPINE SURGERY     lumbar   surgery for DDD     TOTAL HIP ARTHROPLASTY  12/02/2012   Procedure: TOTAL HIP ARTHROPLASTY;  Surgeon: Loanne Drilling, MD;  Location: [facility];  Service: Orthopedics;  Laterality: Right;    No Known Allergies  Outpatient Encounter Medications as of 10/20/2023  Medication Sig   acetaminophen (TYLENOL) 500 MG tablet Take 500 mg by mouth at bedtime. Between 8-11 am and 4-7 pm   acetaminophen (TYLENOL) 500 MG tablet Take 500 mg by mouth 2 (two) times daily as needed. See other listing for scheduled dosing   amLODipine (NORVASC) 5 MG tablet Take 5 mg by mouth daily. At 12 pm   ondansetron (ZOFRAN) 4 MG tablet Take 4 mg by mouth every 8 (eight) hours as needed.   pantoprazole (PROTONIX) 20 MG tablet Take 20 mg by mouth daily. Between 8-11 am   sodium bicarbonate  650 MG tablet Take 650 mg by mouth daily.   No facility-administered encounter medications on file as of 10/20/2023.    Review of Systems  Constitutional:  Negative for activity change and appetite change.  HENT:  Positive for hearing loss. Negative for ear discharge and ear pain.   Respiratory:  Negative for shortness of breath.   Cardiovascular:  Negative for chest pain.  Psychiatric/Behavioral:  Negative for dysphoric mood. The patient is not nervous/anxious.     Immunization History  Administered  Date(s) Administered   Fluad Quad(high Dose 65+) 09/14/2022   Influenza Split 10/11/2013   Influenza, High Dose Seasonal PF 09/18/2014, 09/20/2015, 08/04/2017, 09/16/2018, 10/16/2019, 08/15/2021   Influenza, Seasonal, Injecte, Preservative Fre 10/15/2016   Influenza-Unspecified 10/10/2012, 08/09/2020   Moderna Sars-Covid-2 Vaccination 11/27/2019, 12/28/2019, 09/19/2020   Pneumococcal Conjugate-13 12/19/2014   Pneumococcal Polysaccharide-23 10/11/2013   Tdap 09/21/2012, 01/22/2023   Pertinent  Health Maintenance Due  Topic Date Due   INFLUENZA VACCINE  06/10/2023   HEMOGLOBIN A1C  02/15/2024   OPHTHALMOLOGY EXAM  07/18/2024   FOOT EXAM  07/20/2024   DEXA SCAN  Discontinued      04/15/2022    8:33 AM 10/06/2022   10:06 AM 02/09/2023   12:58 PM 07/21/2023    3:07 PM 08/24/2023   11:12 AM  Fall Risk  Falls in the past year? 1 1 0 1 0  Was there an injury with Fall? 1 1 0 0 0  Fall Risk Category Calculator 2 2 0 1 0  Fall Risk Category (Retired) Moderate Moderate     (RETIRED) Patient Fall Risk Level High fall risk High fall risk     Patient at Risk for Falls Due to History of fall(s);Impaired balance/gait;Impaired mobility History of fall(s) Impaired mobility History of fall(s);Impaired balance/gait;Impaired mobility History of fall(s);Impaired balance/gait;Impaired mobility  Fall risk Follow up Falls evaluation completed Falls evaluation completed Falls evaluation completed Falls evaluation completed;Education provided Falls evaluation completed   Functional Status Survey:    Vitals:   10/20/23 0952  BP: (!) 145/71  Pulse: 81  Resp: 20  Temp: (!) 96.8 F (36 C)  SpO2: 100%  Weight: 135 lb (61.2 kg)  Height: 4\' 11"  (1.499 m)   Body mass index is 27.27 kg/m. Physical Exam Vitals reviewed.  Constitutional:      General: She is not in acute distress. HENT:     Right Ear: There is impacted cerumen.     Left Ear: There is impacted cerumen.     Ears:     Comments: Narrow  ear canal Eyes:     General:        Right eye: No discharge.        Left eye: No discharge.  Cardiovascular:     Rate and Rhythm: Normal rate and regular rhythm.     Pulses: Normal pulses.     Heart sounds: Normal heart sounds.  Pulmonary:     Effort: Pulmonary effort is normal.     Breath sounds: Normal breath sounds.  Musculoskeletal:     Cervical back: Neck supple.  Skin:    General: Skin is warm.     Capillary Refill: Capillary refill takes less than 2 seconds.  Neurological:     General: No focal deficit present.     Mental Status: She is alert and oriented to person, place, and time.  Psychiatric:        Mood and Affect: Mood normal.     Labs reviewed: Recent Labs  11/03/22 0000  NA 137  K 4.4  CL 102  CO2 21  BUN 35*  CREATININE 1.8*  CALCIUM 9.4   Recent Labs    11/03/22 0000  AST 17  ALT 7  ALKPHOS 84  ALBUMIN 4.1   Recent Labs    11/03/22 0000  WBC 8.1  HGB 10.8*  HCT 33*  PLT 224   Lab Results  Component Value Date   TSH 2.22 12/12/2021   Lab Results  Component Value Date   HGBA1C 7.8 08/17/2023   Lab Results  Component Value Date   CHOL 307 (H) 08/15/2021   HDL 49 (L) 08/15/2021   LDLCALC 198 (H) 08/15/2021   TRIG 356 (H) 08/15/2021   CHOLHDL 6.3 (H) 08/15/2021    Significant Diagnostic Results in last 30 days:  No results found.  Assessment/Plan 1. Bilateral impacted cerumen - increased hearing loss per family - cerumen impaction to both ears today - naturally has narrow canal - start Debrox BID x 5 days - flush ears when Debrox complete> recommend scheduling in clinic with Christy if unsuccessful  - carbamide peroxide (DEBROX) 6.5 % OTIC solution; Place 5 drops into both ears 2 (two) times daily for 5 days.    Family/ staff Communication: plan discussed with patient and nurse  Labs/tests ordered:  none

## 2023-10-28 ENCOUNTER — Telehealth (INDEPENDENT_AMBULATORY_CARE_PROVIDER_SITE_OTHER): Payer: Self-pay | Admitting: Otolaryngology

## 2023-10-28 ENCOUNTER — Non-Acute Institutional Stay: Payer: Medicare Other | Admitting: Adult Health

## 2023-10-28 ENCOUNTER — Encounter: Payer: Self-pay | Admitting: Adult Health

## 2023-10-28 DIAGNOSIS — H6122 Impacted cerumen, left ear: Secondary | ICD-10-CM | POA: Diagnosis not present

## 2023-10-28 DIAGNOSIS — H00022 Hordeolum internum right lower eyelid: Secondary | ICD-10-CM

## 2023-10-28 DIAGNOSIS — H903 Sensorineural hearing loss, bilateral: Secondary | ICD-10-CM

## 2023-10-28 MED ORDER — ERYTHROMYCIN 5 MG/GM OP OINT: 1.0000 | TOPICAL_OINTMENT | Freq: Two times a day (BID) | OPHTHALMIC | Status: AC

## 2023-10-28 NOTE — Telephone Encounter (Signed)
Patients daughter called about upcoming appointment for patient on 11/01/23 at 4pm, She said that [facility] is bringing her and that she will not be with the patient that day because she will be out of town. She said asked if Dr Suszanne Conners could call either before, during, or after appointment at her cell [phone]. She said her mother might not understand or be able to hear that well.

## 2023-10-28 NOTE — Progress Notes (Signed)
Location:  Medical illustrator of Service:  ALF (13) Provider:   Peggye Ley, ANP [facility] [phone]   [name], MD  Patient Care Team: [name], MD as PCP - General (Internal Medicine) Ollen Gross, MD as Consulting Physician (Orthopedic Surgery) Chriss Czar, OD as Referring Physician (Optometry) Dorena Cookey, MD (Inactive) as Consulting Physician (Gastroenterology) Sheran Luz, MD as Consulting Physician (Physical Medicine and Rehabilitation) Azalee Course, [place] as Consulting Physician (Cardiology) Newman Pies, MD as Consulting Physician (Otolaryngology) Autumn Patty, MD as Resident (Otolaryngology) Maris Berger, MD as Consulting Physician (Ophthalmology)  Extended Emergency Contact Information Primary Emergency Contact: [name] [place] Home Phone: [phone] Mobile Phone: [phone] Relation: Daughter Secondary Emergency Contact: [name] [place] Mobile Phone: [phone] Relation: Son  Code Status:  DNR Goals of care: Advanced Directive information    08/24/2023   11:12 AM  Advanced Directives  Does Patient Have a Medical Advance Directive? Yes  Type of Advance Directive Out of facility DNR (pink MOST or yellow form)  Does patient want to make changes to medical advance directive? No - Patient declined     Chief Complaint  Patient presents with   Acute Visit    Hearing loss, eye pustule    HPI:  Pt is a 89 y.o. female seen today for an acute visit for cerumen impaction and eye pustule  There is an area of redness and pus to the left lower lid.  No pain or change in vision. No swelling. No fever  She also has worsening hearing loss. She reports she had hearing aides but they did not work.  She has had two occasions of debrox and irrigation to both ears due to wax.  No ear pain, tinnitus, dizziness etc  Of note She had an MRI in May of 2023  which showed active left otitis externa, mastoiditis, osteomyelitis, and inflammation. Treated by ID with zyvox, cipro, and flagyl    Past Medical History:  Diagnosis Date   Arthritis    HLD (hyperlipidemia)    HTN (hypertension)    echo (11/11) with EF 55-60%, mild LV hypertrophy, PA systolic pressure 34 mmHg   Low back pain    Pre-diabetes    diet controlled   Unspecified vitamin D deficiency    Past Surgical History:  Procedure Laterality Date   ABDOMINAL HYSTERECTOMY     partial   abnormal EKG     BACK SURGERY     cataract surgery     colonoscopsy  2008   Neg  Dr Madilyn Fireman   EYE SURGERY Bilateral    cataract   JOINT REPLACEMENT     left hip replacement , right knee replacement    lexiscan myoview  11/11   EF 81%, normal perfusion images suggesting no ischemia or infarction   SPINE SURGERY     lumbar   surgery for DDD     TOTAL HIP ARTHROPLASTY  12/02/2012   Procedure: TOTAL HIP ARTHROPLASTY;  Surgeon: Loanne Drilling, MD;  Location: [facility];  Service: Orthopedics;  Laterality: Right;    No Known Allergies  Outpatient Encounter Medications as of 10/28/2023  Medication Sig   erythromycin ophthalmic ointment Place 1 Application into the right eye in the morning and at bedtime for 7 days.   acetaminophen (TYLENOL) 500 MG tablet Take 500 mg by mouth at bedtime. Between 8-11 am and 4-7 pm   acetaminophen (TYLENOL) 500 MG tablet  Take 500 mg by mouth 2 (two) times daily as needed. See other listing for scheduled dosing   amLODipine (NORVASC) 5 MG tablet Take 5 mg by mouth daily. At 12 pm   ondansetron (ZOFRAN) 4 MG tablet Take 4 mg by mouth every 8 (eight) hours as needed.   pantoprazole (PROTONIX) 20 MG tablet Take 20 mg by mouth daily. Between 8-11 am   sodium bicarbonate 650 MG tablet Take 650 mg by mouth daily.   No facility-administered encounter medications on file as of 10/28/2023.    Review of Systems  Constitutional:  Negative for activity change, appetite change,  chills, diaphoresis, fatigue, fever and unexpected weight change.  HENT:  Negative for congestion, ear discharge and ear pain.        Recurrent ear wax  Eyes:  Positive for redness. Negative for photophobia, pain, discharge, itching and visual disturbance.       Pustule to lower lid on right  Respiratory:  Negative for cough, shortness of breath and wheezing.   Cardiovascular:  Negative for chest pain, palpitations and leg swelling.  Gastrointestinal:  Negative for abdominal distention, abdominal pain, constipation and diarrhea.  Genitourinary:  Negative for difficulty urinating and dysuria.  Musculoskeletal:  Positive for arthralgias and gait problem (walker). Negative for back pain, joint swelling and myalgias.  Neurological:  Negative for dizziness, tremors, seizures, syncope, facial asymmetry, speech difficulty, weakness, light-headedness, numbness and headaches.  Psychiatric/Behavioral:  Negative for agitation, behavioral problems and confusion.        Memory loss    Immunization History  Administered Date(s) Administered   Fluad Quad(high Dose 65+) 09/14/2022, 08/31/2023   Influenza Split 10/11/2013   Influenza, High Dose Seasonal PF 09/18/2014, 09/20/2015, 08/04/2017, 09/16/2018, 10/16/2019, 08/15/2021   Influenza, Seasonal, Injecte, Preservative Fre 10/15/2016   Influenza-Unspecified 10/10/2012, 08/09/2020   Moderna Sars-Covid-2 Vaccination 11/27/2019, 12/28/2019, 09/19/2020   Pneumococcal Conjugate-13 12/19/2014   Pneumococcal Polysaccharide-23 10/11/2013   Tdap 09/21/2012, 01/22/2023   Pertinent  Health Maintenance Due  Topic Date Due   HEMOGLOBIN A1C  02/15/2024   OPHTHALMOLOGY EXAM  07/18/2024   FOOT EXAM  07/20/2024   INFLUENZA VACCINE  Completed   DEXA SCAN  Discontinued      10/06/2022   10:06 AM 02/09/2023   12:58 PM 07/21/2023    3:07 PM 08/24/2023   11:12 AM 10/20/2023   10:08 AM  Fall Risk  Falls in the past year? 1 0 1 0 0  Was there an injury with Fall? 1  0 0 0 0  Fall Risk Category Calculator 2 0 1 0 0  Fall Risk Category (Retired) Moderate      (RETIRED) Patient Fall Risk Level High fall risk      Patient at Risk for Falls Due to History of fall(s) Impaired mobility History of fall(s);Impaired balance/gait;Impaired mobility History of fall(s);Impaired balance/gait;Impaired mobility History of fall(s);Impaired balance/gait;Impaired mobility  Fall risk Follow up Falls evaluation completed Falls evaluation completed Falls evaluation completed;Education provided Falls evaluation completed Falls evaluation completed;Education provided;Falls prevention discussed   Functional Status Survey:    Vitals:   10/28/23 1619  BP: (!) 147/82  Pulse: 89  Resp: 17  Temp: 97.6 F (36.4 C)  SpO2: 98%   There is no height or weight on file to calculate BMI. Physical Exam Vitals and nursing note reviewed.  Constitutional:      General: She is not in acute distress.    Appearance: She is not diaphoretic.  HENT:     Head: Normocephalic and  atraumatic.     Right Ear: Ear canal and external ear normal. There is no impacted cerumen.     Left Ear: There is impacted cerumen.     Ears:     Comments: Right TM with scar tissue, no light reflex. NO drainage no redness.     Mouth/Throat:     Mouth: Mucous membranes are moist.     Pharynx: Oropharynx is clear.  Eyes:     General:        Right eye: No discharge.        Left eye: No discharge.     Conjunctiva/sclera: Conjunctivae normal.     Pupils: Pupils are equal, round, and reactive to light.     Comments: Right lower lid with pustule and surrounding redness. Mild purulent matter noted  Neck:     Vascular: No JVD.  Cardiovascular:     Rate and Rhythm: Normal rate and regular rhythm.     Heart sounds: No murmur heard. Pulmonary:     Effort: Pulmonary effort is normal. No respiratory distress.     Breath sounds: Normal breath sounds. No wheezing.  Abdominal:     General: Bowel sounds are normal.  There is no distension.     Palpations: Abdomen is soft.     Tenderness: There is no abdominal tenderness.  Musculoskeletal:     Cervical back: No rigidity or tenderness.  Lymphadenopathy:     Cervical: No cervical adenopathy.  Skin:    General: Skin is warm and dry.  Neurological:     Mental Status: She is alert and oriented to person, place, and time.     Labs reviewed: Recent Labs    11/03/22 0000  NA 137  K 4.4  CL 102  CO2 21  BUN 35*  CREATININE 1.8*  CALCIUM 9.4   Recent Labs    11/03/22 0000  AST 17  ALT 7  ALKPHOS 84  ALBUMIN 4.1   Recent Labs    11/03/22 0000  WBC 8.1  HGB 10.8*  HCT 33*  PLT 224   Lab Results  Component Value Date   TSH 2.22 12/12/2021   Lab Results  Component Value Date   HGBA1C 7.8 08/17/2023   Lab Results  Component Value Date   CHOL 307 (H) 08/15/2021   HDL 49 (L) 08/15/2021   LDLCALC 198 (H) 08/15/2021   TRIG 356 (H) 08/15/2021   CHOLHDL 6.3 (H) 08/15/2021    Significant Diagnostic Results in last 30 days:  No results found.  Assessment/Plan 1. Hordeolum internum of right lower eyelid (Primary) She has a pustule that is draining to the right lower lid.  - erythromycin ophthalmic ointment; Place 1 Application into the right eye in the morning and at bedtime for 7 days.  2. Impacted cerumen of left ear Right ear is clear of cerumen Left ear is impacted with deep, dark firm cerumen that we can not remove here.  Given her hx of osteomyelitis would recommend ENT f/u  3. Sensorineural hearing loss (SNHL) of both ears Noted and inhibits communication Can f/u with audiology for these issues Left ear is still occluded with cerumen see above.     Family/ staff Communication:  nurse Labs/tests ordered:  NA   Total time :  time greater than 50% of total time spent doing pt counseling and coordination of care

## 2023-11-01 ENCOUNTER — Ambulatory Visit (INDEPENDENT_AMBULATORY_CARE_PROVIDER_SITE_OTHER): Payer: Medicare Other | Admitting: Otolaryngology

## 2023-11-01 VITALS — Ht 59.0 in | Wt 121.0 lb

## 2023-11-01 DIAGNOSIS — H6122 Impacted cerumen, left ear: Secondary | ICD-10-CM | POA: Diagnosis not present

## 2023-11-01 DIAGNOSIS — H6123 Impacted cerumen, bilateral: Secondary | ICD-10-CM | POA: Insufficient documentation

## 2023-11-01 NOTE — Progress Notes (Signed)
Patient ID: Jeanette Espinoza, female   DOB: 24-Mar-1925, 89 y.o.   MRN: [id number]  Procedure: Left ear cerumen disimpaction.   Indication: Cerumen impaction, resulting in ear discomfort and conductive hearing loss.   Description: The patient is placed supine on the operating table. Under the operating microscope, the left ear canal is examined and is noted to be impacted with cerumen. The cerumen is carefully removed with a combination of suction catheters, cerumen curette, and alligator forceps. After the cerumen removal, the ear canal and tympanic membrane are noted to be normal. No middle ear effusion is noted. No impaction is noted on the right side. The patient tolerated the procedure well.  Follow-up care:  The patient is instructed not to use Q-tips to clean the ear canals. The patient will follow up in 6 months.

## 2023-11-29 ENCOUNTER — Encounter: Payer: Medicare Other | Admitting: Adult Health

## 2023-12-06 ENCOUNTER — Non-Acute Institutional Stay: Payer: Self-pay | Admitting: Adult Health

## 2023-12-06 ENCOUNTER — Encounter: Payer: Self-pay | Admitting: Adult Health

## 2023-12-06 VITALS — BP 110/76 | HR 96 | Temp 98.4°F | Resp 17 | Ht 59.0 in | Wt 135.4 lb

## 2023-12-06 DIAGNOSIS — D631 Anemia in chronic kidney disease: Secondary | ICD-10-CM

## 2023-12-06 DIAGNOSIS — E1122 Type 2 diabetes mellitus with diabetic chronic kidney disease: Secondary | ICD-10-CM

## 2023-12-06 DIAGNOSIS — N184 Chronic kidney disease, stage 4 (severe): Secondary | ICD-10-CM

## 2023-12-06 DIAGNOSIS — H903 Sensorineural hearing loss, bilateral: Secondary | ICD-10-CM

## 2023-12-06 DIAGNOSIS — I10 Essential (primary) hypertension: Secondary | ICD-10-CM

## 2023-12-06 DIAGNOSIS — R413 Other amnesia: Secondary | ICD-10-CM

## 2023-12-06 NOTE — Progress Notes (Unsigned)
Location:  [facility]  POS: Clinic  Provider: Fletcher Anon, ANP  Code Status: DNR Goals of Care:     08/24/2023   11:12 AM  Advanced Directives  Does Patient Have a Medical Advance Directive? Yes  Type of Advance Directive Out of facility DNR (pink MOST or yellow form)  Does patient want to make changes to medical advance directive? No - Patient declined     Chief Complaint  Patient presents with   Medical Management of Chronic Issues    Patient is being seen for a 3 month follow up    Immunizations    Patient is due for covid and shingles vaccine     HPI: Patient is a 89 y.o. female seen today for medical management of chronic diseases.    Seen for follow up , resides in assisted living.  PMH significant for HTN, anemia with heme pos stool, Vit D def, Type 2 DM, CKD stage 4, and HLD. Also has a hx of temporal bone osteomyelitis and chronic right hip pain.   Reports worsening hearing loss Had ears cleaned by Dr Suszanne Conners. Some improvement noted.   She had an MRI in May of 2023 which showed active left otitis externa, mastoiditis, osteomyelitis, and inflammation. Treated by ID with zyvox, cipro, and flagyl.  Off antibiotics due to GI side effects for temporal bone osteomyelitis. Update 05/24/23: no drainage, no fever, no pain. Has chronic hearing loss in both ears, left worse.   Had heme pos stool with anemia July of 2023. Placed on protonix.  Hgb 10.4 01/15/23  06/08/22: CKD 4 : thought to be due to HTN and diabetic nephropathy,followed by nephrology, Also had BUN 40 Cr 2.02 on 01/15/2023 at nephrology apt  Has memory loss MMSE 06/27/22 27/30 passed clock Reports she dresses her self Needs help with bathing Walks with walker WC for long distances.   DMII : intolerant to RAAS due to hyperkalemia Lab Results  Component Value Date   HGBA1C 7.8 08/17/2023    Eye exam ordered    HTN: on norvasc, controlled  Has hx of chronic hip pain on tylenol. Denies any pain at this  time.     Past Medical History:  Diagnosis Date   Arthritis    HLD (hyperlipidemia)    HTN (hypertension)    echo (11/11) with EF 55-60%, mild LV hypertrophy, PA systolic pressure 34 mmHg   Low back pain    Pre-diabetes    diet controlled   Unspecified vitamin D deficiency     Past Surgical History:  Procedure Laterality Date   ABDOMINAL HYSTERECTOMY     partial   abnormal EKG     BACK SURGERY     cataract surgery     colonoscopsy  2008   Neg  Dr Madilyn Fireman   EYE SURGERY Bilateral    cataract   JOINT REPLACEMENT     left hip replacement , right knee replacement    lexiscan myoview  11/11   EF 81%, normal perfusion images suggesting no ischemia or infarction   SPINE SURGERY     lumbar   surgery for DDD     TOTAL HIP ARTHROPLASTY  12/02/2012   Procedure: TOTAL HIP ARTHROPLASTY;  Surgeon: Loanne Drilling, MD;  Location: [facility];  Service: Orthopedics;  Laterality: Right;    No Known Allergies  Outpatient Encounter Medications as of 12/06/2023  Medication Sig   acetaminophen (TYLENOL) 500 MG tablet Take 500 mg by mouth at bedtime. Between 8-11 am  and 4-7 pm   acetaminophen (TYLENOL) 500 MG tablet Take 500 mg by mouth 2 (two) times daily as needed. See other listing for scheduled dosing   amLODipine (NORVASC) 5 MG tablet Take 5 mg by mouth daily. At 12 pm   ondansetron (ZOFRAN) 4 MG tablet Take 4 mg by mouth every 8 (eight) hours as needed.   pantoprazole (PROTONIX) 20 MG tablet Take 20 mg by mouth daily. Between 8-11 am   sodium bicarbonate 650 MG tablet Take 650 mg by mouth daily.   No facility-administered encounter medications on file as of 12/06/2023.    Review of Systems:  Review of Systems  Constitutional:  Negative for activity change, appetite change, chills, diaphoresis, fatigue, fever and unexpected weight change.  HENT:  Positive for hearing loss. Negative for congestion, postnasal drip, rhinorrhea, sinus pressure and sore throat.   Respiratory:  Negative for  cough, shortness of breath and wheezing.   Cardiovascular:  Negative for chest pain, palpitations and leg swelling.  Gastrointestinal:  Negative for abdominal distention, abdominal pain, constipation and diarrhea.  Genitourinary:  Negative for difficulty urinating and dysuria.  Musculoskeletal:  Positive for gait problem. Negative for arthralgias (hx of hip and back pain), back pain, joint swelling and myalgias.  Neurological:  Negative for dizziness, tremors, seizures, syncope, facial asymmetry, speech difficulty, weakness, light-headedness, numbness and headaches.  Psychiatric/Behavioral:  Negative for agitation, behavioral problems and confusion.        Memory loss    Health Maintenance  Topic Date Due   Zoster Vaccines- Shingrix (1 of 2) Never done   COVID-19 Vaccine (4 - 2024-25 season) 07/11/2023   HEMOGLOBIN A1C  02/15/2024   OPHTHALMOLOGY EXAM  07/18/2024   FOOT EXAM  07/20/2024   Medicare Annual Wellness (AWV)  07/20/2024   DTaP/Tdap/Td (3 - Td or Tdap) 01/21/2033   Pneumonia Vaccine 65+ Years old  Completed   INFLUENZA VACCINE  Completed   HPV VACCINES  Aged Out   DEXA SCAN  Discontinued    Physical Exam: There were no vitals filed for this visit.   There is no height or weight on file to calculate BMI. Physical Exam Vitals and nursing note reviewed.  Constitutional:      General: She is not in acute distress.    Appearance: She is not diaphoretic.  HENT:     Head: Normocephalic and atraumatic.     Right Ear: There is impacted cerumen.     Left Ear: There is impacted cerumen.     Nose: Nose normal.     Mouth/Throat:     Mouth: Mucous membranes are moist.     Pharynx: Oropharynx is clear.  Eyes:     Conjunctiva/sclera: Conjunctivae normal.     Pupils: Pupils are equal, round, and reactive to light.  Neck:     Vascular: No JVD.  Cardiovascular:     Rate and Rhythm: Normal rate and regular rhythm.     Heart sounds: No murmur heard. Pulmonary:     Effort:  Pulmonary effort is normal. No respiratory distress.     Breath sounds: Normal breath sounds. No wheezing.  Abdominal:     General: Bowel sounds are normal. There is no distension.     Palpations: Abdomen is soft.     Tenderness: There is no abdominal tenderness.  Musculoskeletal:        General: No swelling, tenderness, deformity or signs of injury.     Cervical back: No rigidity or tenderness.  Right lower leg: No edema.     Left lower leg: No edema.  Lymphadenopathy:     Cervical: No cervical adenopathy.  Skin:    General: Skin is warm and dry.  Neurological:     Mental Status: She is alert and oriented to person, place, and time.  Psychiatric:        Mood and Affect: Mood normal.    Labs reviewed: Basic Metabolic Panel: No results for input(s): "NA", "K", "CL", "CO2", "GLUCOSE", "BUN", "CREATININE", "CALCIUM", "MG", "PHOS", "TSH" in the last 8760 hours.  Liver Function Tests: No results for input(s): "AST", "ALT", "ALKPHOS", "BILITOT", "PROT", "ALBUMIN" in the last 8760 hours.  No results for input(s): "LIPASE", "AMYLASE" in the last 8760 hours. No results for input(s): "AMMONIA" in the last 8760 hours. CBC: No results for input(s): "WBC", "NEUTROABS", "HGB", "HCT", "MCV", "PLT" in the last 8760 hours.  Lipid Panel: No results for input(s): "CHOL", "HDL", "LDLCALC", "TRIG", "CHOLHDL", "LDLDIRECT" in the last 8760 hours.  Lab Results  Component Value Date   HGBA1C 7.8 08/17/2023    Procedures since last visit: No results found.  Assessment/Plan  1. Essential hypertension Controlled continue norvasc   2. Type 2 diabetes mellitus with stage 4 chronic kidney disease, without long-term current use of insulin (HCC) A1C trending up 6.5 to 7.5 Discussed diet recommend reduced sugars/carb intake Goal <8 Foot exam done Recommend eye exam  3. CKD stage 4 due to type 2 diabetes mellitus (HCC) Stable Followed by nephrology On sodium bicarb  4. Chronic  suppurative otitis media of left ear, unspecified otitis media location No signs of reinfection as far as fever or pain or drainage Cerumen in ear canal unable to see TM  5. Memory loss Short term memory loss Repeat MMSE Doing well in [place]  6. Hip OA Change tylenol to qhs   Labs/tests ordered:  * No order type specified * CBC BMP A1Cin am   Next appt: 3 months with Dr Chales Abrahams.    Total time :  time greater than 50% of total time spent doing pt counseling and coordination of care

## 2023-12-06 NOTE — Patient Instructions (Signed)
You will have blood work drawn in the morning  We have placed a podiatry consult and an audiology consult

## 2023-12-07 LAB — BASIC METABOLIC PANEL WITH GFR
BUN: 33 — AB (ref 4–21)
CO2: 18 (ref 13–22)
Chloride: 101 (ref 99–108)
Creatinine: 2.3 — AB (ref 0.5–1.1)
Glucose: 130
Potassium: 4.2 meq/L (ref 3.5–5.1)
Sodium: 135 — AB (ref 137–147)

## 2023-12-07 LAB — CBC AND DIFFERENTIAL
HCT: 30 — AB (ref 36–46)
Hemoglobin: 9.9 — AB (ref 12.0–16.0)
Platelets: 232 10*3/uL (ref 150–400)
WBC: 9.9

## 2023-12-07 LAB — COMPREHENSIVE METABOLIC PANEL WITH GFR
Calcium: 9.6 (ref 8.7–10.7)
eGFR: 19

## 2023-12-07 LAB — CBC: RBC: 3.62 — AB (ref 3.87–5.11)

## 2023-12-07 LAB — HEMOGLOBIN A1C: Hemoglobin A1C: 7.2

## 2024-03-07 ENCOUNTER — Encounter: Payer: Self-pay | Admitting: Internal Medicine

## 2024-03-07 ENCOUNTER — Non-Acute Institutional Stay: Payer: Self-pay | Admitting: Internal Medicine

## 2024-03-07 VITALS — BP 110/80 | HR 95 | Temp 98.1°F | Resp 20 | Ht 59.0 in | Wt 135.0 lb

## 2024-03-07 DIAGNOSIS — N184 Chronic kidney disease, stage 4 (severe): Secondary | ICD-10-CM

## 2024-03-07 DIAGNOSIS — H903 Sensorineural hearing loss, bilateral: Secondary | ICD-10-CM | POA: Diagnosis not present

## 2024-03-07 DIAGNOSIS — E1122 Type 2 diabetes mellitus with diabetic chronic kidney disease: Secondary | ICD-10-CM

## 2024-03-07 DIAGNOSIS — I10 Essential (primary) hypertension: Secondary | ICD-10-CM | POA: Diagnosis not present

## 2024-03-07 DIAGNOSIS — D631 Anemia in chronic kidney disease: Secondary | ICD-10-CM

## 2024-03-07 DIAGNOSIS — H6122 Impacted cerumen, left ear: Secondary | ICD-10-CM

## 2024-03-07 NOTE — Progress Notes (Signed)
 Location:  [facility] [employer] features editor of Service:  Clinic (12)  Provider:   Code Status: DNR/DNH Goals of Care:     12/06/2023    1:01 PM  Advanced Directives  Does Patient Have a Medical Advance Directive? Yes  Type of Estate agent of [place];Living will;Out of facility DNR (pink MOST or yellow form)  Does patient want to make changes to medical advance directive? No - Patient declined  Copy of Healthcare Power of Attorney in Chart? No - copy requested     Chief Complaint  Patient presents with   Follow-up    3 month follow up    HPI: Patient is a 89 y.o. female seen today for medical management of chronic diseases.   Lives in [place] in [place]  Has h/o  Patient has h/o Hypertension, HLD, Vit D def, Type 2 diabetes, CKD stage 4   anemia  Chronic Osteomyelitis of Left Temporal bone treated with Oral Cipro  and Linezolid  Could not finish the total duration due to Side effects But has not had symptoms come back  HOH  Had no Complains No Nursing issues Walks with her walker inside the room Still doing her ADLS Need help with shower  Per nurses she does get tired very easily Past Medical History:  Diagnosis Date   Arthritis    HLD (hyperlipidemia)    HTN (hypertension)    echo (11/11) with EF 55-60%, mild LV hypertrophy, PA systolic pressure 34 mmHg   Low back pain    Pre-diabetes    diet controlled   Unspecified vitamin D  deficiency     Past Surgical History:  Procedure Laterality Date   ABDOMINAL HYSTERECTOMY     partial   abnormal EKG     BACK SURGERY     cataract surgery     colonoscopsy  2008   Neg  Dr Sabra Cramp   EYE SURGERY Bilateral    cataract   JOINT REPLACEMENT     left hip replacement , right knee replacement    lexiscan myoview  11/11   EF 81%, normal perfusion images suggesting no ischemia or infarction   SPINE SURGERY     lumbar   surgery for DDD     TOTAL HIP ARTHROPLASTY  12/02/2012   Procedure: TOTAL HIP  ARTHROPLASTY;  Surgeon: Aurther Blue, MD;  Location: [facility];  Service: Orthopedics;  Laterality: Right;    No Known Allergies  Outpatient Encounter Medications as of 03/07/2024  Medication Sig   acetaminophen  (TYLENOL ) 500 MG tablet Take 500 mg by mouth at bedtime. Between 8-11 am and 4-7 pm   acetaminophen  (TYLENOL ) 500 MG tablet Take 500 mg by mouth 2 (two) times daily as needed. See other listing for scheduled dosing   amLODipine  (NORVASC ) 5 MG tablet Take 5 mg by mouth daily. At 12 pm   ondansetron  (ZOFRAN ) 4 MG tablet Take 4 mg by mouth every 6 (six) hours as needed for nausea or vomiting. 4 mg, oral, Every 6 hours- PRN, Zofran  4 mg PO q 6 hours PRN. (If no vomiting)   pantoprazole (PROTONIX) 20 MG tablet Take 20 mg by mouth daily. Between 8-11 am   sodium bicarbonate 650 MG tablet Take 650 mg by mouth daily.   No facility-administered encounter medications on file as of 03/07/2024.    Review of Systems:  Review of Systems  Constitutional:  Negative for activity change and appetite change.  HENT:  Positive for hearing loss.   Respiratory:  Negative for cough and shortness of breath.   Cardiovascular:  Negative for leg swelling.  Gastrointestinal:  Negative for constipation.  Genitourinary: Negative.   Musculoskeletal:  Positive for gait problem. Negative for arthralgias and myalgias.  Skin: Negative.   Neurological:  Negative for dizziness and weakness.  Psychiatric/Behavioral:  Negative for confusion, dysphoric mood and sleep disturbance.     Health Maintenance  Topic Date Due   Zoster Vaccines- Shingrix (1 of 2) Never done   COVID-19 Vaccine (5 - 2024-25 season) 08/31/2024 (Originally 10/26/2023)   HEMOGLOBIN A1C  06/05/2024   INFLUENZA VACCINE  06/09/2024   OPHTHALMOLOGY EXAM  07/18/2024   FOOT EXAM  07/20/2024   Medicare Annual Wellness (AWV)  07/20/2024   DTaP/Tdap/Td (3 - Td or Tdap) 01/21/2033   Pneumonia Vaccine 50+ Years old  Completed   HPV VACCINES  Aged Out    Meningococcal B Vaccine  Aged Out   DEXA SCAN  Discontinued    Physical Exam: Vitals:   03/07/24 1119  BP: 110/80  Pulse: 95  Resp: 20  Temp: 98.1 F (36.7 C)  SpO2: 97%  Weight: 135 lb (61.2 kg)  Height: 4\' 11"  (1.499 m)   Body mass index is 27.27 kg/m. Physical Exam  Labs reviewed: Basic Metabolic Panel: Recent Labs    12/07/23 0000  NA 135*  K 4.2  CL 101  CO2 18  BUN 33*  CREATININE 2.3*  CALCIUM 9.6   Liver Function Tests: No results for input(s): "AST", "ALT", "ALKPHOS", "BILITOT", "PROT", "ALBUMIN" in the last 8760 hours. No results for input(s): "LIPASE", "AMYLASE" in the last 8760 hours. No results for input(s): "AMMONIA" in the last 8760 hours. CBC: Recent Labs    12/07/23 0000  WBC 9.9  HGB 9.9*  HCT 30*  PLT 232   Lipid Panel: No results for input(s): "CHOL", "HDL", "LDLCALC", "TRIG", "CHOLHDL", "LDLDIRECT" in the last 8760 hours. Lab Results  Component Value Date   HGBA1C 7.2 12/07/2023    Procedures since last visit: No results found.  Assessment/Plan 1. Anemia due to stage 4 chronic kidney disease (HCC) (Primary) Will start her on Iron  2. Essential hypertension Stable on Norvasc   3. Type 2 diabetes mellitus with stage 4 chronic kidney disease, without long-term current use of insulin  (HCC) A1C stable No change in Management  4. CKD stage 4 due to type 2 diabetes mellitus (HCC) Follows with Renal On Bicarbonate  5. Sensorineural hearing loss (SNHL) of both ears   6. Impacted cerumen of left ear Wrote for Outside or Aim hearing Wax removal 7 [facility] MMSE 27/30  Doing well in [place]   Labs/tests ordered:  * No order type specified * Next appt:  06/12/2024

## 2024-05-04 ENCOUNTER — Encounter (INDEPENDENT_AMBULATORY_CARE_PROVIDER_SITE_OTHER): Payer: Self-pay | Admitting: Otolaryngology

## 2024-05-04 ENCOUNTER — Ambulatory Visit (INDEPENDENT_AMBULATORY_CARE_PROVIDER_SITE_OTHER): Admitting: Otolaryngology

## 2024-05-04 VITALS — BP 148/82 | HR 90

## 2024-05-04 DIAGNOSIS — H903 Sensorineural hearing loss, bilateral: Secondary | ICD-10-CM | POA: Diagnosis not present

## 2024-05-04 DIAGNOSIS — H6062 Unspecified chronic otitis externa, left ear: Secondary | ICD-10-CM | POA: Insufficient documentation

## 2024-05-04 DIAGNOSIS — Z8669 Personal history of other diseases of the nervous system and sense organs: Secondary | ICD-10-CM

## 2024-05-04 DIAGNOSIS — H60332 Swimmer's ear, left ear: Secondary | ICD-10-CM

## 2024-05-04 DIAGNOSIS — Z09 Encounter for follow-up examination after completed treatment for conditions other than malignant neoplasm: Secondary | ICD-10-CM

## 2024-05-04 DIAGNOSIS — H6123 Impacted cerumen, bilateral: Secondary | ICD-10-CM

## 2024-05-04 NOTE — Progress Notes (Signed)
 Patient ID: Jeanette Espinoza, female   DOB: 27-Mar-1925, 89 y.o.   MRN: [id number]  Follow-up: Bilateral hearing loss, chronic left otitis externa, recurrent cerumen impaction  HPI: The patient is a 89 year old female who returns today with her caregiver.  The patient was previously seen for bilateral high-frequency sensorineural hearing loss, left chronic otitis externa, and recurrent cerumen impaction.  Her chronic left otitis externa was treated with Ciprodex eardrops and periodic debridement.  She was also noted to have bilateral high-frequency sensorineural hearing loss, worse on the left side.  The patient returns today complaining of hearing difficulty.  She denies any otalgia, otorrhea, or vertigo.  Exam: General: Communicates with difficulty, well nourished, no acute distress. Head: Normocephalic, no evidence injury, no tenderness, facial buttresses intact without stepoff. Face/sinus: No tenderness to palpation and percussion. Facial movement is normal and symmetric. Eyes: PERRL, EOMI. No scleral icterus, conjunctivae clear. Neuro: CN II exam reveals vision grossly intact.  No nystagmus at any point of gaze. Ears: Auricles well formed without lesions.  Bilateral cerumen impaction.  Nose: External evaluation reveals normal support and skin without lesions.  Dorsum is intact.  Anterior rhinoscopy reveals congested mucosa over anterior aspect of inferior turbinates and intact septum.  No purulence noted. Oral:  Oral cavity and oropharynx are intact, symmetric, without erythema or edema.  Mucosa is moist without lesions. Neck: Full range of motion without pain.  There is no significant lymphadenopathy.  No masses palpable.  Thyroid bed within normal limits to palpation.  Parotid glands and submandibular glands equal bilaterally without mass.  Trachea is midline. Neuro:  CN 2-12 grossly intact.   Procedure: Bilateral cerumen disimpaction Anesthesia: None Description: Under the operating microscope, the  cerumen is carefully removed with a combination of cerumen currette, alligator forceps, and suction catheters.  After the cerumen is removed, the TMs are noted to be normal.  No mass, erythema, or lesions. The patient tolerated the procedure well.    Assessment: 1.  Bilateral recurrent cerumen impaction.  After the disimpaction procedure, both ear canals, tympanic membranes, and middle ear spaces are noted to be normal. 2.  History of chronic left otitis externa.  However, no acute infection is noted today. 3.  Bilateral high-frequency sensorineural hearing loss, worse on the left side.  Plan: 1.  Otomicroscopy with bilateral cerumen disimpaction. 2.  The physical exam findings are reviewed with the patient and her caregiver. 3.  Continue dry ear precautions on the left side. 4.  The patient will return for reevaluation in 6 months.

## 2024-06-12 ENCOUNTER — Non-Acute Institutional Stay: Payer: Self-pay | Admitting: Adult Health

## 2024-06-12 ENCOUNTER — Encounter: Payer: Self-pay | Admitting: Adult Health

## 2024-06-12 VITALS — BP 130/74 | HR 82 | Temp 97.5°F | Ht 59.0 in | Wt 135.0 lb

## 2024-06-12 DIAGNOSIS — M160 Bilateral primary osteoarthritis of hip: Secondary | ICD-10-CM | POA: Diagnosis not present

## 2024-06-12 DIAGNOSIS — R413 Other amnesia: Secondary | ICD-10-CM | POA: Diagnosis not present

## 2024-06-12 DIAGNOSIS — I10 Essential (primary) hypertension: Secondary | ICD-10-CM

## 2024-06-12 DIAGNOSIS — H903 Sensorineural hearing loss, bilateral: Secondary | ICD-10-CM | POA: Diagnosis not present

## 2024-06-12 DIAGNOSIS — N184 Chronic kidney disease, stage 4 (severe): Secondary | ICD-10-CM

## 2024-06-12 DIAGNOSIS — E1122 Type 2 diabetes mellitus with diabetic chronic kidney disease: Secondary | ICD-10-CM | POA: Diagnosis not present

## 2024-06-12 NOTE — Progress Notes (Signed)
 Location:  [facility]  POS: Clinic  Provider: Tawni America, ANP  Code Status: DNR Goals of Care:     12/06/2023    1:01 PM  Advanced Directives  Does Patient Have a Medical Advance Directive? Yes  Type of Estate agent of [place];Living will;Out of facility DNR (pink MOST or yellow form)  Does patient want to make changes to medical advance directive? No - Patient declined  Copy of Healthcare Power of Attorney in Chart? No - copy requested     Chief Complaint  Patient presents with   Follow-up    3 month follow up. Need for A1c, flu and shingles vaccine as well as AWV    HPI: Patient is a 89 y.o. female seen today for medical management of chronic diseases.    Seen for follow up , resides in assisted living.  PMH significant for HTN, anemia with heme pos stool, Vit D def, Type 2 DM, CKD stage 4, and HLD. Also has a hx of temporal bone osteomyelitis and chronic right hip pain.    Hearing loss Had ears cleaned by Dr Karis.  Hx of osteomyelitis MRI in May of 2023 which showed active left otitis externa, mastoiditis, osteomyelitis, and inflammation. Treated by ID with zyvox , cipro , and flagyl .  Off antibiotics due to GI side effects for temporal bone osteomyelitis. Normal ear exam at last apt with Dr Karis  Had heme pos stool with anemia July of 2023. Placed on protonix. Not worked up due to goals of care.  Placed on iron by Dr Charlanne in April. Need labs from renal.  CKD 4 : thought to be due to HTN and diabetic nephropathy,followed by nephrology  Has memory loss MMSE 21/30  Walks with walker WC for long distances.   DMII : intolerant to RAAS due to hyperkalemia Lab Results  Component Value Date   HGBA1C 7.2 12/07/2023    HTN: on norvasc , controlled  Has hx of chronic hip pain on tylenol . Denies any pain at this time.   Past Medical History:  Diagnosis Date   Arthritis    HLD (hyperlipidemia)    HTN (hypertension)    echo (11/11) with  EF 55-60%, mild LV hypertrophy, PA systolic pressure 34 mmHg   Low back pain    Pre-diabetes    diet controlled   Unspecified vitamin D  deficiency     Past Surgical History:  Procedure Laterality Date   ABDOMINAL HYSTERECTOMY     partial   abnormal EKG     BACK SURGERY     cataract surgery     colonoscopsy  2008   Neg  Dr Dyane   EYE SURGERY Bilateral    cataract   JOINT REPLACEMENT     left hip replacement , right knee replacement    lexiscan myoview  11/11   EF 81%, normal perfusion images suggesting no ischemia or infarction   SPINE SURGERY     lumbar   surgery for DDD     TOTAL HIP ARTHROPLASTY  12/02/2012   Procedure: TOTAL HIP ARTHROPLASTY;  Surgeon: Dempsey LULLA Moan, MD;  Location: [facility];  Service: Orthopedics;  Laterality: Right;    No Known Allergies  Outpatient Encounter Medications as of 06/12/2024  Medication Sig   acetaminophen  (TYLENOL ) 500 MG tablet Take 500 mg by mouth at bedtime. Between 8-11 am and 4-7 pm   acetaminophen  (TYLENOL ) 500 MG tablet Take 500 mg by mouth 2 (two) times daily as needed. See other listing for  scheduled dosing   amLODipine  (NORVASC ) 5 MG tablet Take 5 mg by mouth daily. At 12 pm   ondansetron  (ZOFRAN ) 4 MG tablet Take 4 mg by mouth every 6 (six) hours as needed for nausea or vomiting. 4 mg, oral, Every 6 hours- PRN, Zofran  4 mg PO q 6 hours PRN. (If no vomiting)   pantoprazole (PROTONIX) 20 MG tablet Take 20 mg by mouth daily. Between 8-11 am   sodium bicarbonate 650 MG tablet Take 650 mg by mouth daily.   ferrous sulfate 325 (65 FE) MG EC tablet Take 325 mg by mouth 3 (three) times a week. (Patient not taking: Reported on 06/08/2024)   No facility-administered encounter medications on file as of 06/12/2024.    Review of Systems:  Review of Systems  Constitutional:  Negative for activity change, appetite change, chills, diaphoresis, fatigue, fever and unexpected weight change.  HENT:  Positive for hearing loss. Negative for  congestion, postnasal drip, rhinorrhea, sinus pressure and sore throat.   Respiratory:  Negative for cough, shortness of breath and wheezing.   Cardiovascular:  Negative for chest pain, palpitations and leg swelling.  Gastrointestinal:  Negative for abdominal distention, abdominal pain, constipation and diarrhea.  Genitourinary:  Negative for difficulty urinating and dysuria.  Musculoskeletal:  Positive for gait problem. Negative for arthralgias (hx of hip and back pain), back pain, joint swelling and myalgias.  Neurological:  Negative for dizziness, tremors, seizures, syncope, facial asymmetry, speech difficulty, weakness, light-headedness, numbness and headaches.  Psychiatric/Behavioral:  Negative for agitation, behavioral problems and confusion.        Memory loss    Health Maintenance  Topic Date Due   Zoster Vaccines- Shingrix (1 of 2) Never done   HEMOGLOBIN A1C  06/05/2024   INFLUENZA VACCINE  06/09/2024   Medicare Annual Wellness (AWV)  07/20/2024   COVID-19 Vaccine (5 - 2024-25 season) 08/31/2024 (Originally 10/26/2023)   OPHTHALMOLOGY EXAM  07/18/2024   FOOT EXAM  07/20/2024   DTaP/Tdap/Td (3 - Td or Tdap) 01/21/2033   Pneumococcal Vaccine: 50+ Years  Completed   Hepatitis B Vaccines  Aged Out   HPV VACCINES  Aged Out   Meningococcal B Vaccine  Aged Out   DEXA SCAN  Discontinued    Physical Exam: Vitals:   06/12/24 1349  BP: 130/74  Pulse: 82  Temp: (!) 97.5 F (36.4 C)  SpO2: 96%  Weight: 135 lb (61.2 kg)  Height: 4' 11 (1.499 m)     Body mass index is 27.27 kg/m. Physical Exam Vitals and nursing note reviewed.  Constitutional:      General: She is not in acute distress.    Appearance: She is not diaphoretic.  HENT:     Head: Normocephalic and atraumatic.     Right Ear: Tympanic membrane and ear canal normal. There is no impacted cerumen.     Left Ear: Tympanic membrane and ear canal normal. There is no impacted cerumen.     Nose: Nose normal.      Mouth/Throat:     Mouth: Mucous membranes are moist.     Pharynx: Oropharynx is clear.  Eyes:     Conjunctiva/sclera: Conjunctivae normal.     Pupils: Pupils are equal, round, and reactive to light.  Neck:     Vascular: No JVD.  Cardiovascular:     Rate and Rhythm: Normal rate and regular rhythm.     Heart sounds: No murmur heard. Pulmonary:     Effort: Pulmonary effort is normal. No respiratory distress.  Breath sounds: Normal breath sounds. No wheezing.  Abdominal:     General: Bowel sounds are normal. There is no distension.     Palpations: Abdomen is soft.     Tenderness: There is no abdominal tenderness.  Musculoskeletal:        General: No swelling, tenderness, deformity or signs of injury.     Cervical back: No rigidity or tenderness.     Right lower leg: No edema.     Left lower leg: No edema.  Lymphadenopathy:     Cervical: No cervical adenopathy.  Skin:    General: Skin is warm and dry.  Neurological:     Mental Status: She is alert and oriented to person, place, and time.  Psychiatric:        Mood and Affect: Mood normal.     Labs reviewed: Basic Metabolic Panel: Recent Labs    12/07/23 0000  NA 135*  K 4.2  CL 101  CO2 18  BUN 33*  CREATININE 2.3*  CALCIUM 9.6    Liver Function Tests: No results for input(s): AST, ALT, ALKPHOS, BILITOT, PROT, ALBUMIN in the last 8760 hours.  No results for input(s): LIPASE, AMYLASE in the last 8760 hours. No results for input(s): AMMONIA in the last 8760 hours. CBC: Recent Labs    12/07/23 0000  WBC 9.9  HGB 9.9*  HCT 30*  PLT 232    Lipid Panel: No results for input(s): CHOL, HDL, LDLCALC, TRIG, CHOLHDL, LDLDIRECT in the last 8760 hours.  Lab Results  Component Value Date   HGBA1C 7.2 12/07/2023    Procedures since last visit: No results found.  Assessment/Plan  1. Essential hypertension Controlled continue norvasc    2. Type 2 diabetes mellitus with stage 4  chronic kidney disease, without long-term current use of insulin  (HCC) Diet controlled Repeat A1C   3. CKD stage 4 due to type 2 diabetes mellitus (HCC) Stable Followed by nephrology On sodium bicarb  4. Hearing loss Chronic problem, sees Dr karis  5. Memory loss Short term Doing well in AL  6. Hip OA Continue Tylenol   7. Anemia Lab Results  Component Value Date   HGB 9.9 (A) 12/07/2023   Recheck CBC   Labs/tests ordered:  * No order type specified * CBC BMP A1Cin am   Next appt: 3 months with Dr Charlanne.    Total time :  time greater than 50% of total time spent doing pt counseling and coordination of care        Addendum 06/15/24 Hgb returned at 9.3, resume iron three times weekly A1C 7.1 improved from 7.8. At goal

## 2024-06-12 NOTE — Patient Instructions (Signed)
 Recommend Shingrix vaccine  Will check your blood counts since you started on iron.

## 2024-06-13 LAB — COMPREHENSIVE METABOLIC PANEL WITH GFR
Albumin: 4.2 (ref 3.5–5.0)
Calcium: 9.2 (ref 8.7–10.7)
Globulin: 2.8
eGFR: 17

## 2024-06-13 LAB — BASIC METABOLIC PANEL WITH GFR
BUN: 40 — AB (ref 4–21)
CO2: 19 (ref 13–22)
Chloride: 104 (ref 99–108)
Creatinine: 2.5 — AB (ref 0.5–1.1)
Glucose: 129
Potassium: 4.7 meq/L (ref 3.5–5.1)
Sodium: 137 (ref 137–147)

## 2024-06-13 LAB — CBC AND DIFFERENTIAL
HCT: 28 — AB (ref 36–46)
Hemoglobin: 9.3 — AB (ref 12.0–16.0)
Platelets: 204 K/uL (ref 150–400)
WBC: 7.6

## 2024-06-13 LAB — HEMOGLOBIN A1C: Hemoglobin A1C: 7.1

## 2024-06-13 LAB — HEPATIC FUNCTION PANEL
ALT: 14 U/L (ref 7–35)
AST: 13 (ref 13–35)
Alkaline Phosphatase: 74 (ref 25–125)

## 2024-06-13 LAB — CBC: RBC: 3.38 — AB (ref 3.87–5.11)

## 2024-06-30 ENCOUNTER — Encounter: Payer: Self-pay | Admitting: Adult Health

## 2024-06-30 ENCOUNTER — Non-Acute Institutional Stay: Payer: Self-pay | Admitting: Adult Health

## 2024-06-30 DIAGNOSIS — R0602 Shortness of breath: Secondary | ICD-10-CM | POA: Diagnosis not present

## 2024-06-30 NOTE — Progress Notes (Addendum)
 Location:  [facility] Room Number: 638-P Place of Service:  ALF [phone]) Provider:  Darlean Maus, NP  Patient Care Team: Charlanne Fredia CROME, MD as PCP - General (Internal Medicine) Melodi Lerner, MD as Consulting Physician (Orthopedic Surgery) Cleotilde Eck, OD as Referring Physician (Optometry) Dyane Rush, MD (Inactive) as Consulting Physician (Gastroenterology) Bonner Ade, MD as Consulting Physician (Physical Medicine and Rehabilitation) Janene Boer, [place] as Consulting Physician (Cardiology) Karis Clunes, MD as Consulting Physician (Otolaryngology) Woodward Tilford BROCKS, MD as Resident (Otolaryngology) Leslee Reusing, MD as Consulting Physician (Ophthalmology)  Extended Emergency Contact Information Primary Emergency Contact: Vinson,[name]  [place] Home Phone: [phone] Mobile Phone: [phone] Relation: Daughter Secondary Emergency Contact: Lindia Prentice Raddle  [place] Mobile Phone: [phone] Relation: Son  Code Status:  DNR Goals of care: Advanced Directive information    12/06/2023    1:01 PM  Advanced Directives  Does Patient Have a Medical Advance Directive? Yes  Type of Estate agent of [place];Living will;Out of facility DNR (pink MOST or yellow form)  Does patient want to make changes to medical advance directive? No - Patient declined  Copy of Healthcare Power of Attorney in Chart? No - copy requested     Chief Complaint  Patient presents with   SOB on exertion    HPI:  Pt is a 89 y.o. female seen today for an acute visit for SOB on exertion.  This is not a new symptom per staff but seems to be worse recently.  The nurse wrote an SBAR indicating last evening Ms. A was having sob on exertion. Sats were in the 80s with exertion but improved to 94% with rest. HR 117 with exertion 93 with rest.  No palpitations, weight gain, edema cp, pnd Smoked years ago can't remember when  she quit  Ms A feels that she was rushing more than usual which caused the incident.  She is now at rest and feels at her baseline  Does have underlying CKD C02 19, followed by nephrology on sodium bicarb Lab Results  Component Value Date   BUN 40 (A) 06/13/2024   Lab Results  Component Value Date   CREATININE 2.5 (A) 06/13/2024    12 lead EKG obtained with no acute changes 06/30/2024 SR with PVC wide QRS 0.124  The nurse ambulated with resident 06/30/24 to reproduce her symptoms but was not able to. Sats wree 93% with exertion.   Past Medical History:  Diagnosis Date   Arthritis    HLD (hyperlipidemia)    HTN (hypertension)    echo (11/11) with EF 55-60%, mild LV hypertrophy, PA systolic pressure 34 mmHg   Low back pain    Pre-diabetes    diet controlled   Unspecified vitamin D  deficiency    Past Surgical History:  Procedure Laterality Date   ABDOMINAL HYSTERECTOMY     partial   abnormal EKG     BACK SURGERY     cataract surgery     colonoscopsy  2008   Neg  Dr Dyane   EYE SURGERY Bilateral    cataract   JOINT REPLACEMENT     left hip replacement , right knee replacement    lexiscan myoview  11/11   EF 81%, normal perfusion images suggesting no ischemia or infarction   SPINE SURGERY     lumbar   surgery for DDD     TOTAL HIP ARTHROPLASTY  12/02/2012   Procedure: TOTAL HIP ARTHROPLASTY;  Surgeon: Lerner GAILS  Aluisio, MD;  Location: [facility];  Service: Orthopedics;  Laterality: Right;    No Known Allergies  Outpatient Encounter Medications as of 06/30/2024  Medication Sig   acetaminophen  (TYLENOL ) 500 MG tablet Take 500 mg by mouth at bedtime. Between 8-11 am and 4-7 pm   amLODipine  (NORVASC ) 5 MG tablet Take 5 mg by mouth daily. At 12 pm   ferrous sulfate 325 (65 FE) MG EC tablet Take 325 mg by mouth 3 (three) times a week.   pantoprazole (PROTONIX) 20 MG tablet Take 20 mg by mouth daily. Between 8-11 am   sodium bicarbonate 650 MG tablet Take 650 mg by mouth daily.    acetaminophen  (TYLENOL ) 500 MG tablet Take 500 mg by mouth 2 (two) times daily as needed. See other listing for scheduled dosing   ondansetron  (ZOFRAN ) 4 MG tablet Take 4 mg by mouth every 6 (six) hours as needed for nausea or vomiting. 4 mg, oral, Every 6 hours- PRN, Zofran  4 mg PO q 6 hours PRN. (If no vomiting)   No facility-administered encounter medications on file as of 06/30/2024.    Review of Systems  Constitutional:  Negative for activity change, appetite change, chills, diaphoresis, fatigue and fever.  HENT:  Positive for hearing loss. Negative for congestion.   Respiratory:  Positive for shortness of breath (on exertion). Negative for cough and wheezing.   Cardiovascular:  Negative for chest pain and leg swelling.  Gastrointestinal:  Negative for abdominal distention, abdominal pain, constipation, diarrhea, nausea and vomiting.  Genitourinary:  Negative for difficulty urinating, dysuria and urgency.  Musculoskeletal:  Positive for gait problem (uses walker). Negative for back pain, myalgias and neck pain.  Skin:  Negative for rash.  Neurological:  Negative for dizziness and weakness.  Psychiatric/Behavioral:  Negative for confusion.        Memory loss    Immunization History  Administered Date(s) Administered   Fluad Quad(high Dose 65+) 09/14/2022, 08/31/2023   Influenza Split 10/11/2013   Influenza, High Dose Seasonal PF 09/18/2014, 09/20/2015, 08/04/2017, 09/16/2018, 10/16/2019, 08/15/2021   Influenza, Seasonal, Injecte, Preservative Fre 10/15/2016   Influenza-Unspecified 10/10/2012, 08/09/2020   Moderna Covid-19 Vaccine Bivalent Booster 25yrs & up 08/31/2023   Moderna Sars-Covid-2 Vaccination 11/27/2019, 12/28/2019, 09/19/2020   Pneumococcal Conjugate-13 12/19/2014   Pneumococcal Polysaccharide-23 10/11/2013   Tdap 09/21/2012, 01/22/2023   Zoster Recombinant(Shingrix) 06/14/2024   Pertinent  Health Maintenance Due  Topic Date Due   INFLUENZA VACCINE  06/09/2024    OPHTHALMOLOGY EXAM  07/18/2024   FOOT EXAM  07/20/2024   HEMOGLOBIN A1C  12/14/2024   DEXA SCAN  Discontinued      02/09/2023   12:58 PM 07/21/2023    3:07 PM 08/24/2023   11:12 AM 10/20/2023   10:08 AM 12/06/2023    1:00 PM  Fall Risk  Falls in the past year? 0 1 0 0 0  Was there an injury with Fall? 0 0 0 0 0  Fall Risk Category Calculator 0 1 0 0 0  Patient at Risk for Falls Due to Impaired mobility History of fall(s);Impaired balance/gait;Impaired mobility History of fall(s);Impaired balance/gait;Impaired mobility History of fall(s);Impaired balance/gait;Impaired mobility No Fall Risks  Fall risk Follow up Falls evaluation completed Falls evaluation completed;Education provided Falls evaluation completed Falls evaluation completed;Education provided;Falls prevention discussed Falls evaluation completed   Functional Status Survey:    Vitals:   06/30/24 1059  BP: 130/77  Pulse: 93  Temp: 98.3 F (36.8 C)  SpO2: 94%  Weight: 134 lb (60.8 kg)  Height: 4' 11 (1.499 m)   Body mass index is 27.06 kg/m. Physical Exam Vitals reviewed.  Constitutional:      General: She is not in acute distress.    Appearance: She is not diaphoretic.  HENT:     Head: Normocephalic and atraumatic.  Neck:     Vascular: No JVD.  Cardiovascular:     Rate and Rhythm: Normal rate and regular rhythm.     Heart sounds: No murmur heard. Pulmonary:     Effort: Pulmonary effort is normal. No respiratory distress.     Breath sounds: Normal breath sounds. No wheezing.  Abdominal:     General: Bowel sounds are normal. There is no distension.     Palpations: Abdomen is soft.     Tenderness: There is no abdominal tenderness.  Musculoskeletal:     Right lower leg: No edema.     Left lower leg: No edema.  Lymphadenopathy:     Cervical: No cervical adenopathy.  Skin:    General: Skin is warm and dry.  Neurological:     General: No focal deficit present.     Mental Status: She is alert and oriented  to person, place, and time. Mental status is at baseline.  Psychiatric:        Mood and Affect: Mood normal.     Labs reviewed: Recent Labs    12/07/23 0000 06/13/24 0000  NA 135* 137  K 4.2 4.7  CL 101 104  CO2 18 19  BUN 33* 40*  CREATININE 2.3* 2.5*  CALCIUM 9.6 9.2   Recent Labs    06/13/24 0000  AST 13  ALT 14  ALKPHOS 74  ALBUMIN 4.2   Recent Labs    12/07/23 0000 06/13/24 0000  WBC 9.9 7.6  HGB 9.9* 9.3*  HCT 30* 28*  PLT 232 204   Lab Results  Component Value Date   TSH 2.22 12/12/2021   Lab Results  Component Value Date   HGBA1C 7.1 06/13/2024   Lab Results  Component Value Date   CHOL 307 (H) 08/15/2021   HDL 49 (L) 08/15/2021   LDLCALC 198 (H) 08/15/2021   TRIG 356 (H) 08/15/2021   CHOLHDL 6.3 (H) 08/15/2021    Significant Diagnostic Results in last 30 days:  No results found.  Assessment/Plan  1. SOB (shortness of breath) on exertion (Primary) Normal lung and heart sounds Chronic issue No change in EKG The nurse ambulated with resident 06/30/24 to reproduce her symptoms but was not able to. Sats wree 93% with exertion.  CXR pending No calf edema or tenderness Has CKD with metabolic acidosis on sodium bicarb Anemia unchanged Goals of care are comfort based I suspect due to her CKD a BNP would be elevated   Labs/tests ordered:  EKG CXR , Repeat ambulation with 02 sat  Total time :  time greater than 50% of total time spent doing pt counseling and coordination of care

## 2024-07-01 NOTE — Addendum Note (Signed)
 Addended by: DARLEAN TAWNI CROME on: 07/01/2024 07:46 AM   Modules accepted: Level of Service

## 2024-08-21 NOTE — Telephone Encounter (Signed)
 Message routed back to Tawni America, NP and Heather Boomer/CMA who will be covering Clinical Intake tomorrow. Please forward response to her.

## 2024-08-29 ENCOUNTER — Encounter: Payer: Self-pay | Admitting: Orthopedic Surgery

## 2024-08-29 ENCOUNTER — Non-Acute Institutional Stay: Payer: Self-pay | Admitting: Orthopedic Surgery

## 2024-08-29 DIAGNOSIS — W19XXXA Unspecified fall, initial encounter: Secondary | ICD-10-CM

## 2024-08-29 DIAGNOSIS — R2241 Localized swelling, mass and lump, right lower limb: Secondary | ICD-10-CM

## 2024-08-29 MED ORDER — ACETAMINOPHEN 500 MG PO TABS: 1000.0000 mg | ORAL_TABLET | Freq: Two times a day (BID) | ORAL | Status: AC | PRN

## 2024-08-29 NOTE — Progress Notes (Signed)
 Location:  [facility] Room Number: 638/A Place of Service:  ALF [phone]) Provider:  Greig FORBES Cluster, NP   Charlanne Fredia CROME, MD  Patient Care Team: Charlanne Fredia CROME, MD as PCP - General (Internal Medicine) Melodi Lerner, MD as Consulting Physician (Orthopedic Surgery) Cleotilde Eck, OD as Referring Physician (Optometry) Dyane Rush, MD (Inactive) as Consulting Physician (Gastroenterology) Bonner Ade, MD as Consulting Physician (Physical Medicine and Rehabilitation) Janene Boer, [place] as Consulting Physician (Cardiology) Karis Clunes, MD as Consulting Physician (Otolaryngology) Woodward Tilford BROCKS, MD as Resident (Otolaryngology) Leslee Reusing, MD as Consulting Physician (Ophthalmology)  Extended Emergency Contact Information Primary Emergency Contact: Vinson,[name]  [place] Home Phone: [phone] Mobile Phone: [phone] Relation: Daughter Secondary Emergency Contact: Lindia Prentice Raddle  [place] Mobile Phone: [phone] Relation: Son  Code Status:  DNR Goals of care: Advanced Directive information    12/06/2023    1:01 PM  Advanced Directives  Does Patient Have a Medical Advance Directive? Yes  Type of Estate agent of [place];Living will;Out of facility DNR (pink MOST or yellow form)  Does patient want to make changes to medical advance directive? No - Patient declined  Copy of Healthcare Power of Attorney in Chart? No - copy requested     Chief Complaint  Patient presents with   Acute Visit    Right foot bruising post fall    HPI:  Pt is a 89 y.o. female seen today for acute visit for right foot bruising s/p fall.   Unwitnessed fall with increased bruising to right dorsal foot. Skin tear to left knee present. She complained of increased right foot pain with shoe on. Pain improved after shoe was removed. WBAT. She denies pain during exam. Swelling minimal. Refused ice. Afebrile. Vitals  stable.    Past Medical History:  Diagnosis Date   Arthritis    HLD (hyperlipidemia)    HTN (hypertension)    echo (11/11) with EF 55-60%, mild LV hypertrophy, PA systolic pressure 34 mmHg   Low back pain    Pre-diabetes    diet controlled   Unspecified vitamin D  deficiency    Past Surgical History:  Procedure Laterality Date   ABDOMINAL HYSTERECTOMY     partial   abnormal EKG     BACK SURGERY     cataract surgery     colonoscopsy  2008   Neg  Dr Dyane   EYE SURGERY Bilateral    cataract   JOINT REPLACEMENT     left hip replacement , right knee replacement    lexiscan myoview  11/11   EF 81%, normal perfusion images suggesting no ischemia or infarction   SPINE SURGERY     lumbar   surgery for DDD     TOTAL HIP ARTHROPLASTY  12/02/2012   Procedure: TOTAL HIP ARTHROPLASTY;  Surgeon: Lerner LULLA Melodi, MD;  Location: [facility];  Service: Orthopedics;  Laterality: Right;    No Known Allergies  Outpatient Encounter Medications as of 08/29/2024  Medication Sig   acetaminophen  (TYLENOL ) 500 MG tablet Take 500 mg by mouth at bedtime. Between 8-11 am and 4-7 pm   acetaminophen  (TYLENOL ) 500 MG tablet Take 500 mg by mouth 2 (two) times daily as needed. See other listing for scheduled dosing   amLODipine  (NORVASC ) 5 MG tablet Take 5 mg by mouth daily. At 12 pm   ferrous sulfate 325 (65 FE) MG EC tablet Take 325 mg by mouth 3 (three) times a week.  ondansetron  (ZOFRAN ) 4 MG tablet Take 4 mg by mouth every 6 (six) hours as needed for nausea or vomiting. 4 mg, oral, Every 6 hours- PRN, Zofran  4 mg PO q 6 hours PRN. (If no vomiting)   pantoprazole (PROTONIX) 20 MG tablet Take 20 mg by mouth daily. Between 8-11 am   sodium bicarbonate 650 MG tablet Take 650 mg by mouth daily.   No facility-administered encounter medications on file as of 08/29/2024.    Review of Systems  Constitutional:  Negative for activity change.  HENT:  Positive for hearing loss.   Respiratory:  Negative for  cough and shortness of breath.   Cardiovascular:  Negative for chest pain and leg swelling.  Musculoskeletal:  Positive for arthralgias and gait problem.  Skin:  Positive for color change.  Psychiatric/Behavioral:  Negative for dysphoric mood. The patient is not nervous/anxious.     Immunization History  Administered Date(s) Administered   Fluad Quad(high Dose 65+) 09/14/2022, 08/31/2023   INFLUENZA, HIGH DOSE SEASONAL PF 09/18/2014, 09/20/2015, 08/04/2017, 09/16/2018, 10/16/2019, 08/15/2021   Influenza Split 10/11/2013   Influenza, Seasonal, Injecte, Preservative Fre 10/15/2016   Influenza-Unspecified 10/10/2012, 08/09/2020   Moderna Covid-19 Vaccine Bivalent Booster 34yrs & up 08/31/2023   Moderna Sars-Covid-2 Vaccination 11/27/2019, 12/28/2019, 09/19/2020   Pneumococcal Conjugate-13 12/19/2014   Pneumococcal Polysaccharide-23 10/11/2013   Tdap 09/21/2012, 01/22/2023   Zoster Recombinant(Shingrix) 06/14/2024   Pertinent  Health Maintenance Due  Topic Date Due   Influenza Vaccine  06/09/2024   OPHTHALMOLOGY EXAM  07/18/2024   FOOT EXAM  07/20/2024   HEMOGLOBIN A1C  12/14/2024   DEXA SCAN  Discontinued      02/09/2023   12:58 PM 07/21/2023    3:07 PM 08/24/2023   11:12 AM 10/20/2023   10:08 AM 12/06/2023    1:00 PM  Fall Risk  Falls in the past year? 0 1 0 0 0  Was there an injury with Fall? 0 0 0 0 0  Fall Risk Category Calculator 0 1 0 0 0  Patient at Risk for Falls Due to Impaired mobility History of fall(s);Impaired balance/gait;Impaired mobility History of fall(s);Impaired balance/gait;Impaired mobility History of fall(s);Impaired balance/gait;Impaired mobility No Fall Risks  Fall risk Follow up Falls evaluation completed Falls evaluation completed;Education provided Falls evaluation completed Falls evaluation completed;Education provided;Falls prevention discussed Falls evaluation completed   Functional Status Survey:    Vitals:   08/29/24 1517  BP: 138/82  Pulse:  92  Resp: 16  Temp: (!) 97.5 F (36.4 C)  SpO2: 98%  Weight: 133 lb 12.8 oz (60.7 kg)  Height: 4' 11 (1.499 m)   Body mass index is 27.02 kg/m. Physical Exam Vitals reviewed.  Constitutional:      General: She is not in acute distress. HENT:     Head: Normocephalic and atraumatic.     Ears:     Comments: HOH Eyes:     General:        Right eye: No discharge.        Left eye: No discharge.  Cardiovascular:     Rate and Rhythm: Normal rate and regular rhythm.     Pulses: Normal pulses.     Heart sounds: Normal heart sounds.  Pulmonary:     Effort: Pulmonary effort is normal.     Breath sounds: Normal breath sounds.  Musculoskeletal:     Cervical back: Neck supple.     Comments: Right dorsal foot bruised, FROM, mild swelling, normal dorsiflexion, WBAT  Skin:    General:  Skin is warm.     Capillary Refill: Capillary refill takes less than 2 seconds.     Comments: Skin tear to left knee, CDI  Neurological:     General: No focal deficit present.     Mental Status: She is alert. Mental status is at baseline.     Gait: Gait abnormal.  Psychiatric:        Mood and Affect: Mood normal.     Labs reviewed: Recent Labs    12/07/23 0000 06/13/24 0000  NA 135* 137  K 4.2 4.7  CL 101 104  CO2 18 19  BUN 33* 40*  CREATININE 2.3* 2.5*  CALCIUM 9.6 9.2   Recent Labs    06/13/24 0000  AST 13  ALT 14  ALKPHOS 74  ALBUMIN 4.2   Recent Labs    12/07/23 0000 06/13/24 0000  WBC 9.9 7.6  HGB 9.9* 9.3*  HCT 30* 28*  PLT 232 204   Lab Results  Component Value Date   TSH 2.22 12/12/2021   Lab Results  Component Value Date   HGBA1C 7.1 06/13/2024   Lab Results  Component Value Date   CHOL 307 (H) 08/15/2021   HDL 49 (L) 08/15/2021   LDLCALC 198 (H) 08/15/2021   TRIG 356 (H) 08/15/2021   CHOLHDL 6.3 (H) 08/15/2021    Significant Diagnostic Results in last 30 days:  No results found.  Assessment/Plan 1. Localized swelling of right foot (Primary) -  unwitnessed fall 10/21 - right dorsal foot mild bruising  - FROM, WBAT, no pain during exam - xray right foot and ankle due to advanced age - cont tylenol  prn for pain  2. Fall, initial encounter - see above  - falls safety discussed with patient/nursing - consider PT/OT consult if falls become recurrent      Family/ staff Communication: plan discussed with patient and nurse  Labs/tests ordered:  xray right foot,ankle  /

## 2024-09-04 ENCOUNTER — Non-Acute Institutional Stay: Admitting: Adult Health

## 2024-09-04 ENCOUNTER — Encounter: Payer: Self-pay | Admitting: Adult Health

## 2024-09-04 VITALS — BP 128/74 | HR 100 | Temp 98.2°F | Ht 59.0 in | Wt 134.0 lb

## 2024-09-04 DIAGNOSIS — Z0000 Encounter for general adult medical examination without abnormal findings: Secondary | ICD-10-CM

## 2024-09-04 NOTE — Patient Instructions (Signed)
 [name] , Thank you for taking time to come for your Medicare Wellness Visit. I appreciate your ongoing commitment to your health goals. Please review the following plan we discussed and let me know if I can assist you in the future.   Screening recommendations/referrals: Colonoscopy aged out Mammogram aged out Bone Density declined Recommended yearly ophthalmology/optometry visit for glaucoma screening and checkup Recommended yearly dental visit for hygiene and checkup  Vaccinations: Influenza vaccine- due annually in September/October Pneumococcal vaccine Recommended Tdap vaccine up to date Shingles vaccine up to date    Advanced directives: reviewed  Conditions/risks identified: fall risk  Next appointment: 1 year   Preventive Care 88 Years and Older, Female Preventive care refers to lifestyle choices and visits with your health care provider that can promote health and wellness. What does preventive care include? A yearly physical exam. This is also called an annual well check. Dental exams once or twice a year. Routine eye exams. Ask your health care provider how often you should have your eyes checked. Personal lifestyle choices, including: Daily care of your teeth and gums. Regular physical activity. Eating a healthy diet. Avoiding tobacco and drug use. Limiting alcohol use. Practicing safe sex. Taking low-dose aspirin every day. Taking vitamin and mineral supplements as recommended by your health care provider. What happens during an annual well check? The services and screenings done by your health care provider during your annual well check will depend on your age, overall health, lifestyle risk factors, and family history of disease. Counseling  Your health care provider may ask you questions about your: Alcohol use. Tobacco use. Drug use. Emotional well-being. Home and relationship well-being. Sexual activity. Eating habits. History of falls. Memory  and ability to understand (cognition). Work and work astronomer. Reproductive health. Screening  You may have the following tests or measurements: Height, weight, and BMI. Blood pressure. Lipid and cholesterol levels. These may be checked every 5 years, or more frequently if you are over 88 years old. Skin check. Lung cancer screening. You may have this screening every year starting at age 88 if you have a 30-pack-year history of smoking and currently smoke or have quit within the past 15 years. Fecal occult blood test (FOBT) of the stool. You may have this test every year starting at age 88. Flexible sigmoidoscopy or colonoscopy. You may have a sigmoidoscopy every 5 years or a colonoscopy every 10 years starting at age 88. Hepatitis C blood test. Hepatitis B blood test. Sexually transmitted disease (STD) testing. Diabetes screening. This is done by checking your blood sugar (glucose) after you have not eaten for a while (fasting). You may have this done every 1-3 years. Bone density scan. This is done to screen for osteoporosis. You may have this done starting at age 88. Mammogram. This may be done every 1-2 years. Talk to your health care provider about how often you should have regular mammograms. Talk with your health care provider about your test results, treatment options, and if necessary, the need for more tests. Vaccines  Your health care provider may recommend certain vaccines, such as: Influenza vaccine. This is recommended every year. Tetanus, diphtheria, and acellular pertussis (Tdap, Td) vaccine. You may need a Td booster every 10 years. Zoster vaccine. You may need this after age 88. Pneumococcal 13-valent conjugate (PCV13) vaccine. One dose is recommended after age 88. Pneumococcal polysaccharide (PPSV23) vaccine. One dose is recommended after age 88. Talk to your health care provider about which screenings and vaccines you  need and how often you need them. This  information is not intended to replace advice given to you by your health care provider. Make sure you discuss any questions you have with your health care provider. Document Released: 11/22/2015 Document Revised: 07/15/2016 Document Reviewed: 08/27/2015 Elsevier Interactive Patient Education  2017 [employer].  Fall Prevention in the Home Falls can cause injuries. They can happen to people of all ages. There are many things you can do to make your home safe and to help prevent falls. What can I do on the outside of my home? Regularly fix the edges of walkways and driveways and fix any cracks. Remove anything that might make you trip as you walk through a door, such as a raised step or threshold. Trim any bushes or trees on the path to your home. Use bright outdoor lighting. Clear any walking paths of anything that might make someone trip, such as rocks or tools. Regularly check to see if handrails are loose or broken. Make sure that both sides of any steps have handrails. Any raised decks and porches should have guardrails on the edges. Have any leaves, snow, or ice cleared regularly. Use sand or salt on walking paths during winter. Clean up any spills in your garage right away. This includes oil or grease spills. What can I do in the bathroom? Use night lights. Install grab bars by the toilet and in the tub and shower. Do not use towel bars as grab bars. Use non-skid mats or decals in the tub or shower. If you need to sit down in the shower, use a plastic, non-slip stool. Keep the floor dry. Clean up any water  that spills on the floor as soon as it happens. Remove soap buildup in the tub or shower regularly. Attach bath mats securely with double-sided non-slip rug tape. Do not have throw rugs and other things on the floor that can make you trip. What can I do in the bedroom? Use night lights. Make sure that you have a light by your bed that is easy to reach. Do not use any sheets or  blankets that are too big for your bed. They should not hang down onto the floor. Have a firm chair that has side arms. You can use this for support while you get dressed. Do not have throw rugs and other things on the floor that can make you trip. What can I do in the kitchen? Clean up any spills right away. Avoid walking on wet floors. Keep items that you use a lot in easy-to-reach places. If you need to reach something above you, use a strong step stool that has a grab bar. Keep electrical cords out of the way. Do not use floor polish or wax that makes floors slippery. If you must use wax, use non-skid floor wax. Do not have throw rugs and other things on the floor that can make you trip. What can I do with my stairs? Do not leave any items on the stairs. Make sure that there are handrails on both sides of the stairs and use them. Fix handrails that are broken or loose. Make sure that handrails are as long as the stairways. Check any carpeting to make sure that it is firmly attached to the stairs. Fix any carpet that is loose or worn. Avoid having throw rugs at the top or bottom of the stairs. If you do have throw rugs, attach them to the floor with carpet tape. Make sure that  you have a light switch at the top of the stairs and the bottom of the stairs. If you do not have them, ask someone to add them for you. What else can I do to help prevent falls? Wear shoes that: Do not have high heels. Have rubber bottoms. Are comfortable and fit you well. Are closed at the toe. Do not wear sandals. If you use a stepladder: Make sure that it is fully opened. Do not climb a closed stepladder. Make sure that both sides of the stepladder are locked into place. Ask someone to hold it for you, if possible. Clearly mark and make sure that you can see: Any grab bars or handrails. First and last steps. Where the edge of each step is. Use tools that help you move around (mobility aids) if they are  needed. These include: Canes. Walkers. Scooters. Crutches. Turn on the lights when you go into a dark area. Replace any light bulbs as soon as they burn out. Set up your furniture so you have a clear path. Avoid moving your furniture around. If any of your floors are uneven, fix them. If there are any pets around you, be aware of where they are. Review your medicines with your doctor. Some medicines can make you feel dizzy. This can increase your chance of falling. Ask your doctor what other things that you can do to help prevent falls. This information is not intended to replace advice given to you by your health care provider. Make sure you discuss any questions you have with your health care provider. Document Released: 08/22/2009 Document Revised: 04/02/2016 Document Reviewed: 11/30/2014 Elsevier Interactive Patient Education  2017 [employer].

## 2024-09-04 NOTE — Progress Notes (Signed)
 Subjective:   Jeanette Espinoza is a 89 y.o. female who presents for Medicare Annual (Subsequent) preventive examination at [facility]   Visit Complete: In person  Patient Medicare AWV questionnaire was completed by the patient on 09/04/24; I have confirmed that all information answered by patient is correct and no changes since this date.  Cardiac Risk Factors include: advanced age (>1men, >16 women);hypertension;diabetes mellitus;sedentary lifestyle     Objective:    Today's Vitals   09/04/24 1240  BP: 128/74  Pulse: 100  Temp: 98.2 F (36.8 C)  SpO2: 98%  Weight: 134 lb (60.8 kg)  Height: 4' 11 (1.499 m)   Body mass index is 27.06 kg/m.     09/04/2024    2:14 PM 12/06/2023    1:01 PM 08/24/2023   11:12 AM 05/24/2023    1:01 PM 02/09/2023   12:59 PM 02/09/2023   11:08 AM 01/18/2023   10:55 AM  Advanced Directives  Does Patient Have a Medical Advance Directive? Yes Yes Yes Yes Yes Yes Yes  Type of Estate Agent of [place];Living will;Out of facility DNR (pink MOST or yellow form) Healthcare Power of [employer];Living will;Out of facility DNR (pink MOST or yellow form) Out of facility DNR (pink MOST or yellow form) Healthcare Power of [place];Living will;Out of facility DNR (pink MOST or yellow form) Living will;Out of facility DNR (pink MOST or yellow form);Healthcare Power of Attorney Living will;Out of facility DNR (pink MOST or yellow form);Healthcare Power of Attorney Living will;Out of facility DNR (pink MOST or yellow form)  Does patient want to make changes to medical advance directive?  No - Patient declined No - Patient declined No - Patient declined No - Patient declined No - Patient declined No - Patient declined  Copy of Healthcare Power of Attorney in Chart? Yes - validated most recent copy scanned in chart (See row information) No - copy requested         Current Medications (verified) Outpatient Encounter  Medications as of 09/04/2024  Medication Sig   acetaminophen  (TYLENOL ) 500 MG tablet Take 500 mg by mouth at bedtime. Between 8-11 am and 4-7 pm   acetaminophen  (TYLENOL ) 500 MG tablet Take 2 tablets (1,000 mg total) by mouth every 12 (twelve) hours as needed for up to 21 days.   amLODipine  (NORVASC ) 5 MG tablet Take 5 mg by mouth daily. At 12 pm   ferrous sulfate 325 (65 FE) MG EC tablet Take 325 mg by mouth 3 (three) times a week.   ondansetron  (ZOFRAN ) 4 MG tablet Take 4 mg by mouth every 6 (six) hours as needed for nausea or vomiting. 4 mg, oral, Every 6 hours- PRN, Zofran  4 mg PO q 6 hours PRN. (If no vomiting)   pantoprazole (PROTONIX) 20 MG tablet Take 20 mg by mouth daily. Between 8-11 am   sodium bicarbonate 650 MG tablet Take 650 mg by mouth 2 (two) times daily.   No facility-administered encounter medications on file as of 09/04/2024.    Allergies (verified) Patient has no known allergies.   History: Past Medical History:  Diagnosis Date   Arthritis    HLD (hyperlipidemia)    HTN (hypertension)    echo (11/11) with EF 55-60%, mild LV hypertrophy, PA systolic pressure 34 mmHg   Low back pain    Pre-diabetes    diet controlled   Unspecified vitamin D  deficiency    Past Surgical History:  Procedure Laterality Date   ABDOMINAL HYSTERECTOMY  partial   abnormal EKG     BACK SURGERY     cataract surgery     colonoscopsy  2008   Neg  Dr Dyane   EYE SURGERY Bilateral    cataract   JOINT REPLACEMENT     left hip replacement , right knee replacement    lexiscan myoview  11/11   EF 81%, normal perfusion images suggesting no ischemia or infarction   SPINE SURGERY     lumbar   surgery for DDD     TOTAL HIP ARTHROPLASTY  12/02/2012   Procedure: TOTAL HIP ARTHROPLASTY;  Surgeon: Dempsey LULLA Moan, MD;  Location: [facility];  Service: Orthopedics;  Laterality: Right;   Family History  Problem Relation Age of Onset   Heart disease Mother    Diabetes Other        DM -  sibiling    Coronary artery disease Neg Hx        no premature CAd    Social History   Socioeconomic History   Marital status: Single    Spouse name: Not on file   Number of children: Not on file   Years of education: Not on file   Highest education level: Not on file  Occupational History   Not on file  Tobacco Use   Smoking status: Former    Current packs/day: 0.00    Types: Cigarettes    Quit date: 11/09/1970    Years since quitting: 53.8   Smokeless tobacco: Never  Vaping Use   Vaping status: Not on file  Substance and Sexual Activity   Alcohol use: Yes    Comment: occasional    Drug use: No   Sexual activity: Not on file  Other Topics Concern   Not on file  Social History Narrative   Retired, lives in [place]. Widowed   Daughter in [place]. [place] [phone])   Does not get regular exercise   Social Drivers of Health   Financial Resource Strain: Low Risk  (07/21/2023)   Overall Financial Resource Strain (CARDIA)    Difficulty of Paying Living Expenses: Not hard at all  Food Insecurity: No Food Insecurity (07/21/2023)   Hunger Vital Sign    Worried About Running Out of Food in the Last Year: Never true    Ran Out of Food in the Last Year: Never true  Transportation Needs: No Transportation Needs (07/21/2023)   PRAPARE - Administrator, Civil Service (Medical): No    Lack of Transportation (Non-Medical): No  Physical Activity: Inactive (07/21/2023)   Exercise Vital Sign    Days of Exercise per Week: 0 days    Minutes of Exercise per Session: 0 min  Stress: No Stress Concern Present (07/21/2023)   [employer] of Occupational Health - Occupational Stress Questionnaire    Feeling of Stress : Not at all  Social Connections: Socially Isolated (07/21/2023)   Social Connection and Isolation Panel    Frequency of Communication with Friends and Family: Three times a week    Frequency of Social Gatherings with Friends and Family: Three times a week    Attends  Religious Services: Never    Active Member of Clubs or Organizations: No    Attends Banker Meetings: Never    Marital Status: Widowed    Tobacco Counseling Counseling given: Not Answered   Clinical Intake:  Pre-visit preparation completed: No  Pain : No/denies pain     BMI - recorded: 27.06 Nutritional Status: BMI 25 -  29 Overweight Nutritional Risks: None Diabetes: Yes CBG done?: No Did pt. bring in CBG monitor from home?: No  How often do you need to have someone help you when you read instructions, pamphlets, or other written materials from your doctor or pharmacy?: 1 - Never What is the last grade level you completed in school?: 1 year of college  Interpreter Needed?: No  Information entered by :: Tawni Krishana Lutze NP   Activities of Daily Living    09/04/2024    2:13 PM  In your present state of health, do you have any difficulty performing the following activities:  Hearing? 1  Vision? 1  Difficulty concentrating or making decisions? 1  Walking or climbing stairs? 1  Dressing or bathing? 1  Doing errands, shopping? 1  Preparing Food and eating ? Y  Using the Toilet? N  In the past six months, have you accidently leaked urine? N  Managing your Medications? Y  Managing your Finances? Y    Patient Care Team: Charlanne Fredia CROME, MD as PCP - General (Internal Medicine) Melodi Lerner, MD as Consulting Physician (Orthopedic Surgery) Cleotilde Eck, OD as Referring Physician (Optometry) Dyane Rush, MD (Inactive) as Consulting Physician (Gastroenterology) Bonner Ade, MD as Consulting Physician (Physical Medicine and Rehabilitation) Janene Boer, [place] as Consulting Physician (Cardiology) Karis Clunes, MD as Consulting Physician (Otolaryngology) Sioshansi, Pedrom C, MD as Resident (Otolaryngology) [facility], Christine, MD as Consulting Physician (Ophthalmology)  Indicate any recent Medical Services you may have received from other than [facility] providers in the  past year (date may be approximate).     Assessment:   This is a routine wellness examination for Drina.  Hearing/Vision screen Hearing Screening - Comments:: No hearing issues   Goals Addressed   None    Depression Screen    09/04/2024    1:52 PM 08/29/2024    3:31 PM 12/06/2023    1:01 PM 10/20/2023   10:08 AM 07/21/2023    3:06 PM 02/09/2023   12:59 PM 02/09/2023   12:58 PM  PHQ 2/9 Scores  PHQ - 2 Score 0 0 0 0 0 1 0    Fall Risk    09/04/2024    2:15 PM 09/04/2024    1:51 PM 08/29/2024    3:31 PM 12/06/2023    1:00 PM 10/20/2023   10:08 AM  Fall Risk   Falls in the past year? 1 0 1 0 0  Number falls in past yr: 0 0 0 0 0  Injury with Fall? 1 0 1 0 0  Risk for fall due to : History of fall(s) Impaired balance/gait;Impaired mobility History of fall(s);Impaired balance/gait No Fall Risks History of fall(s);Impaired balance/gait;Impaired mobility  Follow up Falls evaluation completed Falls evaluation completed Falls evaluation completed;Education provided Falls evaluation completed Falls evaluation completed;Education provided;Falls prevention discussed    MEDICARE RISK AT HOME: Medicare Risk at Home Any stairs in or around the home?: No If so, are there any without handrails?: No Home free of loose throw rugs in walkways, pet beds, electrical cords, etc?: Yes Adequate lighting in your home to reduce risk of falls?: Yes Life alert?: Yes Use of a cane, walker or w/c?: Yes Grab bars in the bathroom?: Yes Shower chair or bench in shower?: Yes Elevated toilet seat or a handicapped toilet?: No  TIMED UP AND GO:  Was the test performed?  No    Cognitive Function:    07/21/2023    2:49 PM  MMSE - Mini Mental State Exam  Orientation to time 5  Orientation to Place 5  Registration 3  Attention/ Calculation 5  Recall 0  Language- name 2 objects 2  Language- repeat 1  Language- follow 3 step command 3  Language- read & follow direction 1  Write a sentence 1   Copy design 1  Total score 27        09/04/2024    1:52 PM  6CIT Screen  What Year? 0 points  What month? 0 points  What time? 0 points  Count back from 20 0 points  Months in reverse 2 points  Repeat phrase 10 points  Total Score 12 points    Immunizations Immunization History  Administered Date(s) Administered   Fluad Quad(high Dose 65+) 09/14/2022, 08/31/2023   INFLUENZA, HIGH DOSE SEASONAL PF 09/18/2014, 09/20/2015, 08/04/2017, 09/16/2018, 10/16/2019, 08/15/2021   Influenza Split 10/11/2013   Influenza, Seasonal, Injecte, Preservative Fre 10/15/2016   Influenza-Unspecified 10/10/2012, 08/09/2020, 08/08/2024   Moderna Covid-19 Vaccine Bivalent Booster 34yrs & up 08/31/2023   Moderna Sars-Covid-2 Vaccination 11/27/2019, 12/28/2019, 09/19/2020   Pneumococcal Conjugate-13 12/19/2014   Pneumococcal Polysaccharide-23 10/11/2013   Tdap 09/21/2012, 01/22/2023   Unspecified SARS-COV-2 Vaccination 08/08/2024   Zoster Recombinant(Shingrix) 06/14/2024   Zoster, Unspecified 08/15/2024    TDAP status: Up to date  Flu Vaccine status: Up to date  Pneumococcal vaccine status: Due, Education has been provided regarding the importance of this vaccine. Advised may receive this vaccine at local pharmacy or Health Dept. Aware to provide a copy of the vaccination record if obtained from local pharmacy or Health Dept. Verbalized acceptance and understanding.  Covid-19 vaccine status: Completed vaccines  Qualifies for Shingles Vaccine? Yes   Zostavax completed Yes   Shingrix Completed?: Yes  Screening Tests Health Maintenance  Topic Date Due   OPHTHALMOLOGY EXAM  07/18/2024   FOOT EXAM  07/20/2024   HEMOGLOBIN A1C  12/14/2024   COVID-19 Vaccine (6 - Moderna risk 2025-26 season) 02/05/2025   Medicare Annual Wellness (AWV)  09/04/2025   DTaP/Tdap/Td (3 - Td or Tdap) 01/21/2033   Pneumococcal Vaccine: 50+ Years  Completed   Influenza Vaccine  Completed   Zoster Vaccines- Shingrix   Completed   Meningococcal B Vaccine  Aged Out   DEXA SCAN  Discontinued    Health Maintenance  Health Maintenance Due  Topic Date Due   OPHTHALMOLOGY EXAM  07/18/2024   FOOT EXAM  07/20/2024    Colorectal cancer screening: No longer required.   Mammogram status: Completed NA pt is 89. Repeat every year  Bone Density status: Completed discontinued . Results reflect: Bone density results: OSTEOPOROSIS. Repeat every NA years.  Lung Cancer Screening: (Low Dose CT Chest recommended if Age 33-80 years, 20 pack-year currently smoking OR have quit w/in 15years.) does not qualify.   Lung Cancer Screening Referral: NA  Additional Screening:  Hepatitis C Screening: does not qualify; Completed NA  Vision Screening: Recommended annual ophthalmology exams for early detection of glaucoma and other disorders of the eye. Is the patient up to date with their annual eye exam?  Yes  Who is the provider or what is the name of the office in which the patient attends annual eye exams? [facility] If pt is not established with a provider, would they like to be referred to a provider to establish care? No .   Dental Screening: Recommended annual dental exams for proper oral hygiene  Diabetic Foot Exam: Diabetic Foot Exam: Completed 12/16/23  Community Resource Referral / Chronic Care Management: CRR required this  visit?  No   CCM required this visit?  No     Plan:     I have personally reviewed and noted the following in the patient's chart:   Medical and social history Use of alcohol, tobacco or illicit drugs  Current medications and supplements including opioid prescriptions. Patient is not currently taking opioid prescriptions. Functional ability and status Nutritional status Physical activity Advanced directives List of other physicians Hospitalizations, surgeries, and ER visits in previous 12 months Vitals Screenings to include cognitive, depression, and falls Referrals and  appointments  In addition, I have reviewed and discussed with patient certain preventive protocols, quality metrics, and best practice recommendations. A written personalized care plan for preventive services as well as general preventive health recommendations were provided to patient.     Tawni America, NP   09/04/2024   After Visit Summary: (In Person-Printed) AVS printed and given to the patient  Nurse Notes: NA

## 2024-10-16 ENCOUNTER — Ambulatory Visit (INDEPENDENT_AMBULATORY_CARE_PROVIDER_SITE_OTHER): Admitting: Otolaryngology

## 2024-10-16 ENCOUNTER — Encounter (INDEPENDENT_AMBULATORY_CARE_PROVIDER_SITE_OTHER): Payer: Self-pay | Admitting: Otolaryngology

## 2024-10-16 VITALS — HR 90 | Temp 97.4°F | Ht 59.0 in | Wt 120.0 lb

## 2024-10-16 DIAGNOSIS — H6123 Impacted cerumen, bilateral: Secondary | ICD-10-CM | POA: Diagnosis not present

## 2024-10-16 DIAGNOSIS — H903 Sensorineural hearing loss, bilateral: Secondary | ICD-10-CM

## 2024-10-16 DIAGNOSIS — H60332 Swimmer's ear, left ear: Secondary | ICD-10-CM

## 2024-10-16 NOTE — Progress Notes (Signed)
 Patient ID: Jeanette Espinoza, female   DOB: February 24, 1925, 89 y.o.   MRN: [id number]  Follow-up: Bilateral hearing loss, chronic left otitis externa, recurrent cerumen impaction  HPI: The patient is a 89 year old female who returns today for her follow-up evaluation.  The patient was previously seen for bilateral high-frequency sensorineural hearing loss, left chronic otitis externa, and recurrent cerumen impaction.  Her chronic left otitis externa was treated with Ciprodex eardrops and periodic debridement.  She is also severely hard of hearing.  She was previously noted to have bilateral high-frequency sensorineural hearing loss, worse on the left side.  The patient returns today complaining of persistent hearing difficulty.  She denies any recent ear infection.  She denies any otalgia, otorrhea, or vertigo.  Exam: General: Communicates with difficulty, well nourished, no acute distress. Head: Normocephalic, no evidence injury, no tenderness, facial buttresses intact without stepoff. Face/sinus: No tenderness to palpation and percussion. Facial movement is normal and symmetric. Eyes: PERRL, EOMI. No scleral icterus, conjunctivae clear. Neuro: CN II exam reveals vision grossly intact.  No nystagmus at any point of gaze. Ears: Auricles well formed without lesions.  Bilateral cerumen impaction.  Nose: External evaluation reveals normal support and skin without lesions.  Dorsum is intact.  Anterior rhinoscopy reveals congested mucosa over anterior aspect of inferior turbinates and intact septum.  No purulence noted. Oral:  Oral cavity and oropharynx are intact, symmetric, without erythema or edema.  Mucosa is moist without lesions. Neck: Full range of motion without pain.  There is no significant lymphadenopathy.  No masses palpable.  Thyroid bed within normal limits to palpation.  Parotid glands and submandibular glands equal bilaterally without mass.  Trachea is midline. Neuro:  CN 2-12 grossly intact.    Procedure: Bilateral cerumen disimpaction Anesthesia: None Description: Under the operating microscope, the cerumen is carefully removed with a combination of cerumen currette, alligator forceps, and suction catheters.  After the cerumen is removed, the TMs are noted to be normal.  No mass, erythema, or lesions. The patient tolerated the procedure well.    Assessment: 1.  Bilateral recurrent cerumen impaction.  After the disimpaction procedure, both ear canals, tympanic membranes, and middle ear spaces are noted to be normal. 2.  History of chronic left otitis externa.  However, no acute infection is noted today. 3.  Subjectively stable bilateral high-frequency sensorineural hearing loss, worse on the left side.  Plan: 1.  Otomicroscopy with bilateral cerumen disimpaction. 2.  The physical exam findings are reviewed with the patient. 3.  Continue dry ear precautions on the left side. 4.  The patient will return for reevaluation in 6 months.

## 2024-10-17 ENCOUNTER — Encounter: Payer: Self-pay | Admitting: Internal Medicine

## 2024-10-17 ENCOUNTER — Non-Acute Institutional Stay: Payer: Self-pay | Admitting: Internal Medicine

## 2024-10-17 DIAGNOSIS — E1122 Type 2 diabetes mellitus with diabetic chronic kidney disease: Secondary | ICD-10-CM

## 2024-10-17 DIAGNOSIS — N184 Chronic kidney disease, stage 4 (severe): Secondary | ICD-10-CM

## 2024-10-17 DIAGNOSIS — H903 Sensorineural hearing loss, bilateral: Secondary | ICD-10-CM

## 2024-10-17 DIAGNOSIS — D631 Anemia in chronic kidney disease: Secondary | ICD-10-CM | POA: Diagnosis not present

## 2024-10-18 ENCOUNTER — Encounter: Payer: Self-pay | Admitting: Internal Medicine

## 2024-10-24 NOTE — Progress Notes (Unsigned)
 Location:  [facility] of Service:  Clinic (12)  Provider:   Code Status: *** Goals of Care:     09/04/2024    2:14 PM  Advanced Directives  Does Patient Have a Medical Advance Directive? Yes  Type of Estate Agent of [place];Living will;Out of facility DNR (pink MOST or yellow form)  Copy of Healthcare Power of Attorney in Chart? Yes - validated most recent copy scanned in chart (See row information)     Chief Complaint  Patient presents with   Follow-up    4 month follow up    HPI: Patient is a 89 y.o. female seen today for medical management of chronic diseases.    Discussed the use of AI scribe software for clinical note transcription with the patient, who gave verbal consent to proceed.  History of Present Illness   Jeanette Espinoza is a 89 year old female who presents with hearing loss and shortness of breath.  She reports progressive hearing loss despite long-term use of hearing aids since age 65. Recent wax removal did not improve her hearing. There is a family history of hearing loss.  She has exertional shortness of breath walking to the dining room and usually needs to stop twice to sit and rest. This has been present for years, but her daughter feels it may be slightly worse. She uses a walker and can still get to meals independently. She does not find the shortness of breath very bothersome.  She has chronic low hemoglobin related to kidney function and takes iron. Her nephrologist monitors kidney function and hemoglobin every three to six months.  She has recurrent falls, with the most recent in October causing an ankle injury. She uses pull-ups for incontinence. She receives help with showers once a week and needs assistance with one shoe but otherwise dresses herself.  She notes good appetite and sleep. Her blood pressure has occasionally been slightly elevated.      Past Medical History:  Diagnosis Date    Arthritis    HLD (hyperlipidemia)    HTN (hypertension)    echo (11/11) with EF 55-60%, mild LV hypertrophy, PA systolic pressure 34 mmHg   Low back pain    Pre-diabetes    diet controlled   Unspecified vitamin D  deficiency     Past Surgical History:  Procedure Laterality Date   ABDOMINAL HYSTERECTOMY     partial   abnormal EKG     BACK SURGERY     cataract surgery     colonoscopsy  2008   Neg  Dr Dyane   EYE SURGERY Bilateral    cataract   JOINT REPLACEMENT     left hip replacement , right knee replacement    lexiscan myoview  11/11   EF 81%, normal perfusion images suggesting no ischemia or infarction   SPINE SURGERY     lumbar   surgery for DDD     TOTAL HIP ARTHROPLASTY  12/02/2012   Procedure: TOTAL HIP ARTHROPLASTY;  Surgeon: Dempsey LULLA Moan, MD;  Location: [facility];  Service: Orthopedics;  Laterality: Right;    Allergies[1]  Outpatient Encounter Medications as of 10/17/2024  Medication Sig   acetaminophen  (TYLENOL ) 500 MG tablet Take 500 mg by mouth at bedtime. Between 8-11 am and 4-7 pm   amLODipine  (NORVASC ) 5 MG tablet Take 5 mg by mouth daily. At 12 pm   cholecalciferol (VITAMIN D3) 25 MCG (1000 UNIT) tablet Take 1,000 Units by  mouth daily.   ferrous sulfate 325 (65 FE) MG EC tablet Take 325 mg by mouth 3 (three) times a week.   ondansetron  (ZOFRAN ) 4 MG tablet Take 4 mg by mouth every 6 (six) hours as needed for nausea or vomiting. 4 mg, oral, Every 6 hours- PRN, Zofran  4 mg PO q 6 hours PRN. (If no vomiting)   pantoprazole (PROTONIX) 20 MG tablet Take 20 mg by mouth daily. Between 8-11 am   sodium bicarbonate 650 MG tablet Take 650 mg by mouth 2 (two) times daily.   No facility-administered encounter medications on file as of 10/17/2024.    Review of Systems:  Review of Systems  Health Maintenance  Topic Date Due   OPHTHALMOLOGY EXAM  07/18/2024   HEMOGLOBIN A1C  12/14/2024   FOOT EXAM  12/15/2024   COVID-19 Vaccine (6 - Moderna risk 2025-26 season)  02/05/2025   Medicare Annual Wellness (AWV)  09/04/2025   DTaP/Tdap/Td (3 - Td or Tdap) 01/21/2033   Pneumococcal Vaccine: 50+ Years  Completed   Influenza Vaccine  Completed   Zoster Vaccines- Shingrix  Completed   Meningococcal B Vaccine  Aged Out   Bone Density Scan  Discontinued    Physical Exam: Vitals:   10/17/24 1155  BP: 120/76  Pulse: 100  Temp: 98.3 F (36.8 C)  SpO2: 98%  Height: 4' 11 (1.499 m)   Body mass index is 24.24 kg/m. Physical Exam  Labs reviewed: Basic Metabolic Panel: Recent Labs    12/07/23 0000 06/13/24 0000  NA 135* 137  K 4.2 4.7  CL 101 104  CO2 18 19  BUN 33* 40*  CREATININE 2.3* 2.5*  CALCIUM 9.6 9.2   Liver Function Tests: Recent Labs    06/13/24 0000  AST 13  ALT 14  ALKPHOS 74  ALBUMIN 4.2   No results for input(s): LIPASE, AMYLASE in the last 8760 hours. No results for input(s): AMMONIA in the last 8760 hours. CBC: Recent Labs    12/07/23 0000 06/13/24 0000  WBC 9.9 7.6  HGB 9.9* 9.3*  HCT 30* 28*  PLT 232 204   Lipid Panel: No results for input(s): CHOL, HDL, LDLCALC, TRIG, CHOLHDL, LDLDIRECT in the last 8760 hours. Lab Results  Component Value Date   HGBA1C 7.1 06/13/2024    Procedures since last visit: No results found.  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 Hemoglobin low, causing shortness of breath. - Continue iron supplementation. - Monitor hemoglobin levels with nephrologist. - Requested nephrologist's notes from October visit.  Age-related sensorineural hearing loss Hearing loss due to age-related nerve degeneration. Hearing aids in use. - Continue use of hearing aids.  Hypertension Blood pressure slightly elevated, possibly due to coffee consumption. Monitoring advised. - Check blood pressure in the morning before coffee consumption. - Monitor blood pressure  regularly.  Mobility impairment with history of falls Mobility impairment with walker use. Recent fall caused foot injury. Shortness of breath likely from decreased endurance and anemia. - Continue use of walker for mobility support.  Urinary incontinence Managed with pull-ups. No recent accidents. - Continue use of pull-ups for incontinence management.  General health maintenance Routine health maintenance discussed. Nephrologist monitoring relevant parameters. - Ordered A1c test. - Requested nephrologist's notes from October visit. - Ensure regular follow-up with nephrologist.       Labs/tests ordered:  * No order type specified * Next appt:  01/15/2025        [  1] No Known Allergies

## 2024-12-14 ENCOUNTER — Non-Acute Institutional Stay: Payer: Self-pay | Admitting: Adult Health

## 2024-12-14 DIAGNOSIS — M25511 Pain in right shoulder: Secondary | ICD-10-CM

## 2024-12-15 ENCOUNTER — Encounter: Payer: Self-pay | Admitting: Adult Health

## 2024-12-15 MED ORDER — POLYETHYLENE GLYCOL 3350 17 GM/SCOOP PO POWD: 17.0000 g | Freq: Every day | ORAL | Status: AC

## 2024-12-15 MED ORDER — ACETAMINOPHEN 500 MG PO TABS: 1000.0000 mg | ORAL_TABLET | Freq: Two times a day (BID) | ORAL | Status: AC

## 2024-12-15 NOTE — Progress Notes (Signed)
 " Location:  [facility] Illustrator of Service:  SNF (31) Provider:   Bari America, ANP [facility] [phone]   Charlanne Fredia CROME, MD  Patient Care Team: Charlanne Fredia CROME, MD as PCP - General (Internal Medicine) Melodi Lerner, MD as Consulting Physician (Orthopedic Surgery) Cleotilde Eck, OD as Referring Physician (Optometry) Dyane Rush, MD (Inactive) as Consulting Physician (Gastroenterology) Bonner Ade, MD as Consulting Physician (Physical Medicine and Rehabilitation) Janene Boer, [place] as Consulting Physician (Cardiology) Karis Clunes, MD as Consulting Physician (Otolaryngology) Woodward Tilford BROCKS, MD as Resident (Otolaryngology) Leslee Reusing, MD as Consulting Physician (Ophthalmology)  Extended Emergency Contact Information Primary Emergency Contact: Vinson,[name]  [place] Home Phone: [phone] Mobile Phone: [phone] Relation: Daughter Secondary Emergency Contact: Lindia Prentice Raddle  [place] Mobile Phone: [phone] Relation: Son  Code Status:  DNR Goals of care: Advanced Directive information    09/04/2024    2:14 PM  Advanced Directives  Does Patient Have a Medical Advance Directive? Yes  Type of Estate Agent of [place];Living will;Out of facility DNR (pink MOST or yellow form)  Copy of Healthcare Power of Attorney in Chart? Yes - validated most recent copy scanned in chart (See row information)     Chief Complaint  Patient presents with   Acute Visit    Right shoulder pain     HPI:  Pt is a 89 y.o. female seen today for an acute visit for right shoulder pain  The nursing staff report that she had a fall on 12/08/24.  Notes indicate she went to sit on her walker and it rolled away from her. She did not hit her head nor did she pass out. She is now complaining of right shoulder pain. The shoulder is difficult to move and there is swelling. She is using small doses of  tylenol  with little relief.   Notes indicate she had some trouble with her ADL care    Past Medical History:  Diagnosis Date   Arthritis    HLD (hyperlipidemia)    HTN (hypertension)    echo (11/11) with EF 55-60%, mild LV hypertrophy, PA systolic pressure 34 mmHg   Low back pain    Pre-diabetes    diet controlled   Unspecified vitamin D  deficiency    Past Surgical History:  Procedure Laterality Date   ABDOMINAL HYSTERECTOMY     partial   abnormal EKG     BACK SURGERY     cataract surgery     colonoscopsy  2008   Neg  Dr Dyane   EYE SURGERY Bilateral    cataract   JOINT REPLACEMENT     left hip replacement , right knee replacement    lexiscan myoview  11/11   EF 81%, normal perfusion images suggesting no ischemia or infarction   SPINE SURGERY     lumbar   surgery for DDD     TOTAL HIP ARTHROPLASTY  12/02/2012   Procedure: TOTAL HIP ARTHROPLASTY;  Surgeon: Lerner LULLA Melodi, MD;  Location: [facility];  Service: Orthopedics;  Laterality: Right;    Allergies[1]  Outpatient Encounter Medications as of 12/14/2024  Medication Sig   acetaminophen  (TYLENOL ) 500 MG tablet Take 2 tablets (1,000 mg total) by mouth in the morning and at bedtime for 7 days.   polyethylene glycol powder (GLYCOLAX /MIRALAX ) 17 GM/SCOOP powder Take 17 g by mouth daily. Dissolve 1 capful (17g) in 4-8 ounces of liquid and take by mouth daily.   amLODipine  (  NORVASC ) 5 MG tablet Take 5 mg by mouth daily. At 12 pm   cholecalciferol (VITAMIN D3) 25 MCG (1000 UNIT) tablet Take 1,000 Units by mouth daily.   ferrous sulfate 325 (65 FE) MG EC tablet Take 325 mg by mouth 3 (three) times a week.   ondansetron  (ZOFRAN ) 4 MG tablet Take 4 mg by mouth every 6 (six) hours as needed for nausea or vomiting. 4 mg, oral, Every 6 hours- PRN, Zofran  4 mg PO q 6 hours PRN. (If no vomiting)   pantoprazole (PROTONIX) 20 MG tablet Take 20 mg by mouth daily. Between 8-11 am   sodium bicarbonate 650 MG tablet Take 650 mg by mouth 2 (two)  times daily.   [DISCONTINUED] acetaminophen  (TYLENOL ) 500 MG tablet Take 500 mg by mouth at bedtime. Between 8-11 am and 4-7 pm   No facility-administered encounter medications on file as of 12/14/2024.    Review of Systems  Musculoskeletal:  Positive for arthralgias (right shoulder), gait problem (uses walker.) and joint swelling (right shoulder.). Negative for back pain.    Immunization History  Administered Date(s) Administered   Fluad Quad(high Dose 65+) 09/14/2022, 08/31/2023   INFLUENZA, HIGH DOSE SEASONAL PF 09/18/2014, 09/20/2015, 08/04/2017, 09/16/2018, 10/16/2019, 08/15/2021   Influenza Split 10/11/2013   Influenza, Seasonal, Injecte, Preservative Fre 10/15/2016   Influenza-Unspecified 10/10/2012, 08/09/2020, 08/08/2024   Moderna Covid-19 Vaccine Bivalent Booster 55yrs & up 08/31/2023   Moderna Sars-Covid-2 Vaccination 11/27/2019, 12/28/2019, 09/19/2020   PNEUMOCOCCAL CONJUGATE-20 09/06/2024   Pneumococcal Conjugate-13 12/19/2014   Pneumococcal Polysaccharide-23 10/11/2013   Tdap 09/21/2012, 01/22/2023   Unspecified SARS-COV-2 Vaccination 08/08/2024   Zoster Recombinant(Shingrix) 06/14/2024   Zoster, Unspecified 08/15/2024   Pertinent  Health Maintenance Due  Topic Date Due   OPHTHALMOLOGY EXAM  07/18/2024   HEMOGLOBIN A1C  12/14/2024   FOOT EXAM  12/15/2024   Influenza Vaccine  Completed   Bone Density Scan  Discontinued      12/06/2023    1:00 PM 08/29/2024    3:31 PM 09/04/2024    1:51 PM 09/04/2024    2:15 PM 10/17/2024    1:31 PM  Fall Risk  Falls in the past year? 0 1 0 1 0  Was there an injury with Fall? 0  1  0  1  0  Fall Risk Category Calculator 0 2 0 2 0  Patient at Risk for Falls Due to No Fall Risks History of fall(s);Impaired balance/gait Impaired balance/gait;Impaired mobility History of fall(s) Impaired balance/gait;Impaired mobility  Fall risk Follow up Falls evaluation completed Falls evaluation completed;Education provided Falls evaluation  completed Falls evaluation completed Falls evaluation completed     Data saved with a previous flowsheet row definition   Functional Status Survey:    There were no vitals filed for this visit. There is no height or weight on file to calculate BMI. Physical Exam Vitals and nursing note reviewed.  Constitutional:      Appearance: Normal appearance.  Cardiovascular:     Rate and Rhythm: Normal rate and regular rhythm.  Pulmonary:     Effort: Pulmonary effort is normal.     Breath sounds: Normal breath sounds.  Musculoskeletal:     Right shoulder: Swelling, tenderness and bony tenderness present. No deformity or effusion. Normal pulse.     Left shoulder: No swelling, deformity, tenderness or bony tenderness. Decreased range of motion. Normal pulse.     Comments: +CMS TO RUE  Skin:    General: Skin is warm and dry.  Findings: Bruising (low back) present.  Neurological:     Mental Status: She is alert. Mental status is at baseline.     Labs reviewed: Recent Labs    06/13/24 0000  NA 137  K 4.7  CL 104  CO2 19  BUN 40*  CREATININE 2.5*  CALCIUM 9.2   Recent Labs    06/13/24 0000  AST 13  ALT 14  ALKPHOS 74  ALBUMIN 4.2   Recent Labs    06/13/24 0000  WBC 7.6  HGB 9.3*  HCT 28*  PLT 204   Lab Results  Component Value Date   TSH 2.22 12/12/2021   Lab Results  Component Value Date   HGBA1C 7.1 06/13/2024   Lab Results  Component Value Date   CHOL 307 (H) 08/15/2021   HDL 49 (L) 08/15/2021   LDLCALC 198 (H) 08/15/2021   TRIG 356 (H) 08/15/2021   CHOLHDL 6.3 (H) 08/15/2021    Significant Diagnostic Results in last 30 days:  No results found.  Assessment/Plan 1. Acute pain of right shoulder (Primary) 2 view xray of the right shoulder Tylenol  1 gram bid for 7 days    Total time :  time greater than 50% of total time spent doing pt counseling and coordination of care       [1] No Known Allergies  "

## 2025-01-15 ENCOUNTER — Encounter: Admitting: Adult Health

## 2025-04-16 ENCOUNTER — Ambulatory Visit (INDEPENDENT_AMBULATORY_CARE_PROVIDER_SITE_OTHER): Admitting: Otolaryngology
